# Patient Record
Sex: Female | Born: 1935 | Race: White | Hispanic: No | Smoking: Former smoker
Health system: Southern US, Community
[De-identification: ages and names within clinical notes are randomized; demographics above are authoritative.]

## PROBLEM LIST (undated history)

## (undated) DIAGNOSIS — R600 Localized edema: Secondary | ICD-10-CM

## (undated) DIAGNOSIS — G309 Alzheimer's disease, unspecified: Secondary | ICD-10-CM

## (undated) DIAGNOSIS — I447 Left bundle-branch block, unspecified: Secondary | ICD-10-CM

## (undated) DIAGNOSIS — F419 Anxiety disorder, unspecified: Secondary | ICD-10-CM

## (undated) DIAGNOSIS — F0280 Dementia in other diseases classified elsewhere without behavioral disturbance: Secondary | ICD-10-CM

## (undated) HISTORY — DX: Anxiety disorder, unspecified: F41.9

## (undated) HISTORY — PX: HEMORRHOID SURGERY: SHX153

## (undated) HISTORY — DX: Left bundle-branch block, unspecified: I44.7

## (undated) HISTORY — DX: Localized edema: R60.0

---

## 1958-09-09 HISTORY — PX: CHOLECYSTECTOMY: SHX55

## 1958-09-09 HISTORY — PX: OTHER SURGICAL HISTORY: SHX169

## 2004-08-01 ENCOUNTER — Ambulatory Visit: Payer: Self-pay | Admitting: Family Medicine

## 2005-01-08 LAB — HM MAMMOGRAPHY

## 2005-10-15 ENCOUNTER — Ambulatory Visit: Payer: Self-pay | Admitting: Gastroenterology

## 2005-12-14 ENCOUNTER — Ambulatory Visit: Payer: Self-pay | Admitting: *Deleted

## 2005-12-14 DIAGNOSIS — R609 Edema, unspecified: Secondary | ICD-10-CM | POA: Insufficient documentation

## 2005-12-17 ENCOUNTER — Encounter (INDEPENDENT_AMBULATORY_CARE_PROVIDER_SITE_OTHER): Payer: Self-pay | Admitting: *Deleted

## 2006-01-09 ENCOUNTER — Ambulatory Visit: Payer: Self-pay | Admitting: *Deleted

## 2006-02-06 ENCOUNTER — Ambulatory Visit: Payer: Self-pay | Admitting: *Deleted

## 2006-02-07 ENCOUNTER — Encounter: Payer: Self-pay | Admitting: Internal Medicine

## 2006-03-11 ENCOUNTER — Ambulatory Visit: Payer: Self-pay | Admitting: *Deleted

## 2006-03-22 ENCOUNTER — Ambulatory Visit: Payer: Self-pay | Admitting: *Deleted

## 2006-04-05 ENCOUNTER — Encounter (INDEPENDENT_AMBULATORY_CARE_PROVIDER_SITE_OTHER): Payer: Self-pay | Admitting: *Deleted

## 2006-07-19 ENCOUNTER — Ambulatory Visit: Payer: Self-pay | Admitting: *Deleted

## 2006-11-22 ENCOUNTER — Ambulatory Visit: Payer: Self-pay | Admitting: Internal Medicine

## 2006-11-22 DIAGNOSIS — R002 Palpitations: Secondary | ICD-10-CM | POA: Insufficient documentation

## 2006-11-25 LAB — CONVERTED CEMR LAB
BUN: 14 mg/dL (ref 6–23)
Bilirubin, Direct: 0.1 mg/dL (ref 0.0–0.3)
Calcium: 9.9 mg/dL (ref 8.4–10.5)
Chloride: 94 meq/L — ABNORMAL LOW (ref 96–112)
Eosinophils Absolute: 0.2 10*3/uL (ref 0.0–0.6)
Eosinophils Relative: 2.7 % (ref 0.0–5.0)
GFR calc Af Amer: 61 mL/min
GFR calc non Af Amer: 50 mL/min
HCT: 38.8 % (ref 36.0–46.0)
Lymphocytes Relative: 31.4 % (ref 12.0–46.0)
MCHC: 34.3 g/dL (ref 30.0–36.0)
MCV: 91.2 fL (ref 78.0–100.0)
Neutro Abs: 4.7 10*3/uL (ref 1.4–7.7)
Neutrophils Relative %: 58 % (ref 43.0–77.0)
Sodium: 134 meq/L — ABNORMAL LOW (ref 135–145)
TSH: 1.94 microintl units/mL (ref 0.35–5.50)
Total Protein: 7.2 g/dL (ref 6.0–8.3)
WBC: 8 10*3/uL (ref 4.5–10.5)

## 2006-12-03 ENCOUNTER — Ambulatory Visit: Payer: Self-pay | Admitting: Internal Medicine

## 2006-12-03 DIAGNOSIS — R7989 Other specified abnormal findings of blood chemistry: Secondary | ICD-10-CM | POA: Insufficient documentation

## 2006-12-09 LAB — CONVERTED CEMR LAB: Potassium: 3.1 meq/L — ABNORMAL LOW (ref 3.5–5.1)

## 2007-01-06 ENCOUNTER — Telehealth (INDEPENDENT_AMBULATORY_CARE_PROVIDER_SITE_OTHER): Payer: Self-pay | Admitting: *Deleted

## 2007-03-04 ENCOUNTER — Telehealth: Payer: Self-pay | Admitting: Internal Medicine

## 2007-03-06 ENCOUNTER — Ambulatory Visit: Payer: Self-pay | Admitting: Internal Medicine

## 2007-04-11 ENCOUNTER — Encounter (INDEPENDENT_AMBULATORY_CARE_PROVIDER_SITE_OTHER): Payer: Self-pay | Admitting: *Deleted

## 2007-04-11 ENCOUNTER — Ambulatory Visit: Payer: Self-pay | Admitting: Internal Medicine

## 2007-04-14 LAB — CONVERTED CEMR LAB
Albumin: 3.7 g/dL (ref 3.5–5.2)
Calcium: 9.8 mg/dL (ref 8.4–10.5)
GFR calc non Af Amer: 56 mL/min
Glucose, Bld: 90 mg/dL (ref 70–99)
Sodium: 139 meq/L (ref 135–145)

## 2007-05-26 ENCOUNTER — Ambulatory Visit: Payer: Self-pay | Admitting: Internal Medicine

## 2007-05-26 DIAGNOSIS — R498 Other voice and resonance disorders: Secondary | ICD-10-CM

## 2007-09-26 ENCOUNTER — Ambulatory Visit: Payer: Self-pay | Admitting: Internal Medicine

## 2007-09-26 DIAGNOSIS — K5909 Other constipation: Secondary | ICD-10-CM

## 2007-09-26 DIAGNOSIS — J309 Allergic rhinitis, unspecified: Secondary | ICD-10-CM | POA: Insufficient documentation

## 2008-03-25 ENCOUNTER — Ambulatory Visit: Payer: Self-pay | Admitting: Internal Medicine

## 2008-03-26 LAB — CONVERTED CEMR LAB
ALT: 16 units/L (ref 0–35)
AST: 21 units/L (ref 0–37)
Albumin: 4.1 g/dL (ref 3.5–5.2)
Alkaline Phosphatase: 72 units/L (ref 39–117)
Basophils Absolute: 0 10*3/uL (ref 0.0–0.1)
Creatinine, Ser: 1.1 mg/dL (ref 0.4–1.2)
HCT: 38.9 % (ref 36.0–46.0)
Lymphs Abs: 2.2 10*3/uL (ref 0.7–4.0)
Monocytes Absolute: 0.4 10*3/uL (ref 0.1–1.0)
Monocytes Relative: 6.4 % (ref 3.0–12.0)
Phosphorus: 3.4 mg/dL (ref 2.3–4.6)
Platelets: 450 10*3/uL — ABNORMAL HIGH (ref 150.0–400.0)
RDW: 12.2 % (ref 11.5–14.6)
Total Protein: 7 g/dL (ref 6.0–8.3)

## 2008-03-31 ENCOUNTER — Ambulatory Visit: Payer: Self-pay | Admitting: Internal Medicine

## 2008-03-31 DIAGNOSIS — R1314 Dysphagia, pharyngoesophageal phase: Secondary | ICD-10-CM

## 2008-04-28 ENCOUNTER — Encounter: Payer: Self-pay | Admitting: Internal Medicine

## 2008-09-21 ENCOUNTER — Ambulatory Visit: Payer: Self-pay | Admitting: Internal Medicine

## 2008-09-21 DIAGNOSIS — F411 Generalized anxiety disorder: Secondary | ICD-10-CM | POA: Insufficient documentation

## 2008-09-28 ENCOUNTER — Telehealth: Payer: Self-pay | Admitting: Internal Medicine

## 2008-10-04 ENCOUNTER — Telehealth: Payer: Self-pay | Admitting: Internal Medicine

## 2008-10-05 ENCOUNTER — Ambulatory Visit: Payer: Self-pay | Admitting: Internal Medicine

## 2008-10-06 ENCOUNTER — Telehealth: Payer: Self-pay | Admitting: Internal Medicine

## 2008-10-08 ENCOUNTER — Telehealth: Payer: Self-pay | Admitting: Internal Medicine

## 2008-10-12 ENCOUNTER — Telehealth: Payer: Self-pay | Admitting: Internal Medicine

## 2008-11-16 ENCOUNTER — Ambulatory Visit: Payer: Self-pay | Admitting: Internal Medicine

## 2009-02-18 ENCOUNTER — Ambulatory Visit: Payer: Self-pay | Admitting: Internal Medicine

## 2009-02-28 ENCOUNTER — Ambulatory Visit: Payer: Self-pay | Admitting: Internal Medicine

## 2009-02-28 DIAGNOSIS — R42 Dizziness and giddiness: Secondary | ICD-10-CM

## 2009-03-17 ENCOUNTER — Ambulatory Visit: Payer: Self-pay | Admitting: Family Medicine

## 2009-03-17 DIAGNOSIS — N390 Urinary tract infection, site not specified: Secondary | ICD-10-CM

## 2009-03-18 ENCOUNTER — Encounter: Payer: Self-pay | Admitting: Family Medicine

## 2009-03-21 ENCOUNTER — Telehealth: Payer: Self-pay | Admitting: Internal Medicine

## 2009-03-25 ENCOUNTER — Telehealth: Payer: Self-pay | Admitting: Internal Medicine

## 2009-08-22 ENCOUNTER — Ambulatory Visit: Payer: Self-pay | Admitting: Internal Medicine

## 2009-08-23 ENCOUNTER — Telehealth: Payer: Self-pay | Admitting: Internal Medicine

## 2009-08-29 ENCOUNTER — Telehealth: Payer: Self-pay | Admitting: Internal Medicine

## 2009-08-30 ENCOUNTER — Ambulatory Visit: Payer: Self-pay | Admitting: Family Medicine

## 2009-08-30 DIAGNOSIS — Z9189 Other specified personal risk factors, not elsewhere classified: Secondary | ICD-10-CM | POA: Insufficient documentation

## 2009-08-30 LAB — CONVERTED CEMR LAB
Nitrite: NEGATIVE
Urine crystals, microscopic: 0 /hpf
Urobilinogen, UA: 0.2
WBC Urine, dipstick: NEGATIVE
Yeast, UA: 0

## 2009-09-01 LAB — CONVERTED CEMR LAB
Basophils Absolute: 0.1 10*3/uL (ref 0.0–0.1)
Eosinophils Relative: 4.6 % (ref 0.0–5.0)
MCV: 93 fL (ref 78.0–100.0)
Monocytes Absolute: 0.6 10*3/uL (ref 0.1–1.0)
Monocytes Relative: 7.8 % (ref 3.0–12.0)
Neutrophils Relative %: 54.8 % (ref 43.0–77.0)
Platelets: 542 10*3/uL — ABNORMAL HIGH (ref 150.0–400.0)
RDW: 13.1 % (ref 11.5–14.6)
WBC: 7.3 10*3/uL (ref 4.5–10.5)

## 2009-10-12 ENCOUNTER — Ambulatory Visit: Payer: Self-pay | Admitting: Family Medicine

## 2009-10-12 DIAGNOSIS — J157 Pneumonia due to Mycoplasma pneumoniae: Secondary | ICD-10-CM

## 2009-10-31 ENCOUNTER — Ambulatory Visit: Payer: Self-pay | Admitting: Internal Medicine

## 2009-11-01 ENCOUNTER — Telehealth: Payer: Self-pay | Admitting: Internal Medicine

## 2010-01-12 ENCOUNTER — Ambulatory Visit
Admission: RE | Admit: 2010-01-12 | Discharge: 2010-01-12 | Payer: Self-pay | Source: Home / Self Care | Attending: Family Medicine | Admitting: Family Medicine

## 2010-02-07 ENCOUNTER — Emergency Department: Payer: Self-pay | Admitting: Emergency Medicine

## 2010-02-07 NOTE — Progress Notes (Signed)
Summary: still constipated  Phone Note Call from Patient   Caller: Patient Summary of Call: Pt called, still having problem with constipation.  She has not tried a suppository, I advised her to try that and call back if not better.  She is also going to call the nurse at her facility and see if she will help her with an enema. Initial call taken by: Lowella Petties CMA,  August 23, 2009 10:45 AM  Follow-up for Phone Call        PLease let her know that it will take a while for the miralax to loosen things up She may be better off waiting at least 3-4 days after starting the miralax before she uses the enema or suppository Follow-up by: Cindee Salt MD,  August 23, 2009 11:01 AM  Additional Follow-up for Phone Call Additional follow up Details #1::        Advised pt, she says miralax has never worked for her, I advised her to continue for a few days. Additional Follow-up by: Lowella Petties CMA,  August 23, 2009 11:07 AM

## 2010-02-07 NOTE — Assessment & Plan Note (Signed)
Summary: ? UTI   Vital Signs:  Patient profile:   77 year old female Height:      60.5 inches Weight:      116.8 pounds Temp:     98.1 degrees F oral Pulse rate:   76 / minute Pulse rhythm:   regular BP sitting:   130 / 80  (left arm) Cuff size:   regular  Vitals Entered By: Benny Lennert CMA Duncan Dull) (March 17, 2009 9:53 AM)  History of Present Illness: Chief complaint ? uti  Pushing. No pain  some urgency, no abd pain, no flank pain, no fever no dysuria  ros above  GEN: WDWN, A&Ox4,NAD. Non-toxic HEENT: Atraumatic, normocephalic. CV: RRR, No M/G/R PULM: CTA B, No wheezes, crackles, or rhonchi ABD: S, NT, ND, +BS, no rebound. No CVAT. No suprapubic tenderness. EXT: No c/c/e   Allergies: 1)  ! * Ivp Dye 2)  ! Penicillin V Potassium (Penicillin V Potassium) 3)  ! Prednisone (Prednisone) 4)  Codeine Sulfate (Codeine Sulfate) 5)  Aspirin (Aspirin) 6)  Prevacid (Lansoprazole) 7)  Sulfa 8)  Hyzaar (Losartan Potassium-Hctz)  Past History:  Past medical, surgical, family and social histories (including risk factors) reviewed, and no changes noted (except as noted below).  Past Medical History: Reviewed history from 09/21/2008 and no changes required. Leg edema Constipation LBBB Allergic rhinitis Anxiety  Past Surgical History: Reviewed history from 04/05/2006 and no changes required. Cholecystectomy-1960's Hemorrhoidectomy- late 1950's Hysterectomy- 1960's  Family History: Reviewed history from 11/22/2006 and no changes required. Mom died of colon cancer @76  Dad died of MI in 22's 2 - 1/2 brothers and 1- 1/2 sister No other CAD No HTN or DM Mat aunt had lung cancer No breast cancer  Social History: Reviewed history from 11/16/2008 and no changes required. Widowed--2 children--1 son, 1 daughter Independent living at [facility] Retired--hosiery worker Former Smoker--very brief Alcohol use-no  Has living will. Sherry Fini-ex-DIL to make decisions  for her. Requests DNR after discussion. No feeding tube   Impression & Recommendations:  Problem # 1:  UTI (ICD-599.0) Assessment New  Her updated medication list for this problem includes:    Ciprofloxacin Hcl 250 Mg Tabs (Ciprofloxacin hcl) .Marland Kitchen... 1 by mouth two times a day  Encouraged to push clear liquids, get enough rest, and take acetaminophen as needed. To be seen in 10 days if no improvement, sooner if worse.  Orders: T-Culture, Urine ([id number])  Complete Medication List: 1)  Adult Aspirin Low Strength 81 Mg Tbdp (Aspirin) .... Take 1 tablet by mouth once a day 2)  Ex-lax 15 Mg Tabs (Sennosides) .... As needed 3)  Meclizine Hcl 12.5 Mg Tabs (Meclizine hcl) .Marland Kitchen.. 1 tab by mouth three times a day as needed for dizziness 4)  Ciprofloxacin Hcl 250 Mg Tabs (Ciprofloxacin hcl) .Marland Kitchen.. 1 by mouth two times a day Prescriptions: CIPROFLOXACIN HCL 250 MG TABS (CIPROFLOXACIN HCL) 1 by mouth two times a day  #14 x 0   Entered and Authorized by:   Hannah Beat MD   Signed by:   Hannah Beat MD on 03/17/2009   Method used:   [facility]        [id number]       [address]       [place], [place]  [address]       Ph: [phone]       Fax: [phone]   RxID:   [id number]   Current Allergies (reviewed today): ! * IVP DYE !  PENICILLIN V POTASSIUM (PENICILLIN V POTASSIUM) ! PREDNISONE (PREDNISONE) CODEINE SULFATE (CODEINE SULFATE) ASPIRIN (ASPIRIN) PREVACID (LANSOPRAZOLE) SULFA HYZAAR (LOSARTAN POTASSIUM-HCTZ)  Appended Document: Orders Update    Clinical Lists Changes  Orders: Added new Service order of Specimen Handling ([id number]) - Signed

## 2010-02-07 NOTE — Assessment & Plan Note (Signed)
Summary: DIZZY/CLE   Vital Signs:  Patient profile:   77 year old female Weight:      116 pounds Temp:     98.3 degrees F oral Pulse rate:   76 / minute Pulse rhythm:   regular BP sitting:   152 / 80  (left arm) Cuff size:   regular  Vitals Entered By: Mervin Hack CMA Duncan Dull) (February 28, 2009 4:47 PM) CC: dizzy   History of Present Illness: Was very lightheaded all last week Close to falling a couple of times Got a cane at [facility] to balance herself  Now today, she feels better  didn't feel close to syncope Used OTC cerumen removal drops--thinks that may have helped  Did have some rotatory spinning last week no nausea  No weakness no trouble with speech or swallowing  Allergies: 1)  ! * Ivp Dye 2)  ! Penicillin V Potassium (Penicillin V Potassium) 3)  ! Prednisone (Prednisone) 4)  Codeine Sulfate (Codeine Sulfate) 5)  Aspirin (Aspirin) 6)  Prevacid (Lansoprazole) 7)  Sulfa 8)  Hyzaar (Losartan Potassium-Hctz)  Past History:  Past medical, surgical, family and social histories (including risk factors) reviewed for relevance to current acute and chronic problems.  Past Medical History: Reviewed history from 09/21/2008 and no changes required. Leg edema Constipation LBBB Allergic rhinitis Anxiety  Past Surgical History: Reviewed history from 04/05/2006 and no changes required. Cholecystectomy-1960's Hemorrhoidectomy- late 1950's Hysterectomy- 1960's  Family History: Reviewed history from 11/22/2006 and no changes required. Mom died of colon cancer @76  Dad died of MI in 35's 2 - 1/2 brothers and 1- 1/2 sister No other CAD No HTN or DM Mat aunt had lung cancer No breast cancer  Social History: Reviewed history from 11/16/2008 and no changes required. Widowed--2 children--1 son, 1 daughter Independent living at [facility] Retired--hosiery worker Former Smoker--very brief Alcohol use-no  Has living will. Sherry Santilli-ex-DIL to make  decisions for her. Requests DNR after discussion. No feeding tube  Review of Systems       appetite okay sleep is still not great----tylenol or advil does help Bowels okay with senekot and stool softener  Physical Exam  General:  alert and normal appearance.   Eyes:  pupils equal, pupils round, and pupils reactive to light.  EOM full NO nystagmus Ears:  R ear normal and L ear normal.   No cerumen Mouth:  no erythema, no exudates, and no lesions.   Neck:  supple, no masses, and no cervical lymphadenopathy.   Neurologic:  alert & oriented X3, cranial nerves II-XII intact, strength normal in all extremities, gait normal, finger-to-nose normal, and Romberg negative.   Psych:  normally interactive, good eye contact, not anxious appearing, and not depressed appearing.     Complete Medication List: 1)  Adult Aspirin Low Strength 81 Mg Tbdp (Aspirin) .... Take 1 tablet by mouth once a day 2)  Ex-lax 15 Mg Tabs (Sennosides) .... As needed 3)  Meclizine Hcl 12.5 Mg Tabs (Meclizine hcl) .Marland Kitchen.. 1 tab by mouth three times a day as needed for dizziness  Patient Instructions: 1)  Please keep your regular follow up 2)  Take the meclizine 12.5 mg three times a day if the dizziness and spinning sensation comes back  Current Allergies (reviewed today): ! * IVP DYE ! PENICILLIN V POTASSIUM (PENICILLIN V POTASSIUM) ! PREDNISONE (PREDNISONE) CODEINE SULFATE (CODEINE SULFATE) ASPIRIN (ASPIRIN) PREVACID (LANSOPRAZOLE) SULFA HYZAAR (LOSARTAN POTASSIUM-HCTZ)

## 2010-02-07 NOTE — Progress Notes (Signed)
Summary: miralax upsetting stomach/alc   Phone Note Call from Patient Call back at Home Phone [phone]   Caller: Patient Call For: Cindee Salt MD Summary of Call: Patient says that her stomach has been hurting and rumbling since saturday night. She says that it started after starting the miralax. She wants to know if she needs to be seen of if she should try something else. [facility] [address]  Initial call taken by: Melody Comas,  August 29, 2009 4:12 PM  Follow-up for Phone Call        have her try just half a capful instead of a full one Make sure she is mixing it with a lot of water Cindee Salt MD  August 29, 2009 4:44 PM   spoke with patient and she will try 1/2 and drink plenty of water Follow-up by: Mervin Hack CMA Duncan Dull),  August 29, 2009 5:20 PM

## 2010-02-07 NOTE — Progress Notes (Signed)
Summary: needs rx for sorbitol   Phone Note Call from Patient Call back at Home Phone [phone]   Caller: Daughter Call For: Cindee Salt MD Summary of Call: Daughter took patient to pharmacy yesterday to get her medications, they were told that she would need a rx for the sorbitol because it is on longer over the counter. [facility] [address]  Initial call taken by: Melody Comas,  November 01, 2009 9:56 AM  Follow-up for Phone Call        okay to send Rx for 1 month supply with 3 refills  Let her know that if she can tolerate it three times a day, it would be okay to start with this much, but I don't think she will need it that much after a while Follow-up by: Cindee Salt MD,  November 01, 2009 10:32 AM  Additional Follow-up for Phone Call Additional follow up Details #1::        rx sent to pharmacy, spoke with patient and let her know. Additional Follow-up by: Mervin Hack CMA Duncan Dull),  November 01, 2009 4:35 PM    Prescriptions: SORBITOL 70 % SOLN (SORBITOL) 2  ounces up to three times  daily to prevent constipatioin  #54mth x 3   Entered by:   Mervin Hack CMA (AAMA)   Authorized by:   Cindee Salt MD   Signed by:   Mervin Hack CMA (AAMA) on 11/01/2009   Method used:   Electronically to        [id number]       [address]       [place], [place]  [address]       Ph: [phone]       Fax: [phone]   RxID:   [id number]

## 2010-02-07 NOTE — Assessment & Plan Note (Signed)
Summary: BODY IS TREMBLING & SHE IS COLD / LFW   Vital Signs:  Patient profile:   77 year old female Height:      60.5 inches Weight:      113.75 pounds BMI:     21.93 Temp:     99 degrees F oral Pulse rate:   88 / minute Pulse rhythm:   regular BP sitting:   158 / 84  (left arm) Cuff size:   regular  Vitals Entered By: Lewanda Rife LPN (August 30, 2009 2:54 PM) CC: Body trembling and pt feels cold   History of Present Illness: started feeling shaky and chilled -- this am  here- she has a fever   no BM for quite a few days  stomach is rumbling a lot - could not go to church  no urinary symptoms   is drinking lots of water   no sore throat or cold symtoms or cough   stomach not hurting - just bloated and tight  stays constipated  takes med -- stool softener - this usually helps    last week poor appetite and a little nausea- thought she had a virus thinks her friend gave it to her and her appetite is still poor  no tick bites or rash anywhere    Allergies: 1)  ! * Ivp Dye 2)  ! Penicillin V Potassium (Penicillin V Potassium) 3)  ! Prednisone (Prednisone) 4)  Codeine Sulfate (Codeine Sulfate) 5)  Aspirin (Aspirin) 6)  Prevacid (Lansoprazole) 7)  Sulfa 8)  Hyzaar (Losartan Potassium-Hctz)  Past History:  Past Medical History: Last updated: 09/21/2008 Leg edema Constipation LBBB Allergic rhinitis Anxiety  Past Surgical History: Last updated: 04/05/2006 Cholecystectomy-1960's Hemorrhoidectomy- late 1950's Hysterectomy- 1960's  Family History: Last updated: Dec 10, 2006 Mom died of colon cancer @76  Dad died of MI in 1's 2 - 1/2 brothers and 1- 1/2 sister No other CAD No HTN or DM Mat aunt had lung cancer No breast cancer  Social History: Last updated: 11/16/2008 Widowed--2 children--1 son, 1 daughter Independent living at [facility] Retired--hosiery worker Former Smoker--very brief Alcohol use-no  Has living will. Sherry Mccannon-ex-DIL to make  decisions for her. Requests DNR after discussion. No feeding tube  Risk Factors: Smoking Status: quit (Dec 10, 2006)  Review of Systems General:  Complains of chills, fatigue, fever, and loss of appetite. Eyes:  Denies blurring, discharge, and eye irritation. ENT:  Complains of postnasal drainage. CV:  Denies chest pain or discomfort and palpitations. Resp:  Denies cough, shortness of breath, and wheezing. GI:  Complains of indigestion, loss of appetite, and vomiting; denies abdominal pain and change in bowel habits. Derm:  Denies itching, lesion(s), poor wound healing, and rash. Neuro:  Denies headaches, numbness, and tingling. Endo:  Denies excessive thirst and excessive urination. Heme:  Denies abnormal bruising, bleeding, enlarge lymph nodes, and skin discoloration.  Physical Exam  General:  Well-developed,well-nourished,in no acute distress; alert,appropriate and cooperative throughout examination Head:  normocephalic, atraumatic, and no abnormalities observed.  no sinus tenderness  Eyes:  vision grossly intact, pupils equal, pupils round, pupils reactive to light, and no injection.   Ears:  R ear normal and L ear normal.   Nose:  no nasal discharge.   Mouth:  pharynx pink and moist.   Neck:  supple, no masses, no thyromegaly, no carotid bruits, and no cervical lymphadenopathy.   Chest Wall:  No deformities, masses, or tenderness noted. Lungs:  Normal respiratory effort, chest expands symmetrically. Lungs are clear to auscultation, no  crackles or wheezes. Heart:  normal rate, regular rhythm, no murmur, and no gallop.   Abdomen:  Bowel sounds positive,abdomen soft and non-tender without masses, organomegaly or hernias noted. bs are slt hyperactive - not high pitched or tinkling  Msk:  No deformity or scoliosis noted of thoracic or lumbar spine.  no acute joint changes  Extremities:  No clubbing, cyanosis, edema, or deformity noted with normal full range of motion of all joints.     Neurologic:  gait normal and DTRs symmetrical and normal.   Skin:  Intact without suspicious lesions or rashes Cervical Nodes:  No lymphadenopathy noted Inguinal Nodes:  No significant adenopathy Psych:  normal affect, talkative and pleasant    Impression & Recommendations:  Problem # 1:  FEVER, HX OF (ICD-V15.9) Assessment New with exposure to a mild GI virus- some loss of appetite and mild nausea ua neg  check cbc today disc good fluid intake / rest /tylenol as needed  will update if worse or other symptoms  Orders: Venipuncture (29562) TLB-CBC Platelet - w/Differential ([id number]) UA Dipstick W/ Micro (manual) (13086)  Complete Medication List: 1)  Adult Aspirin Low Strength 81 Mg Tbdp (Aspirin) .... Take 1 tablet by mouth once a day 2)  Meclizine Hcl 12.5 Mg Tabs (Meclizine hcl) .Marland Kitchen.. 1 tab by mouth three times a day as needed for dizziness 3)  Fleet Enema 7-19 Gm/155ml Enem (Sodium phosphates) .... Use as needed if no stool in 3days 4)  Stool Softener Laxative 8.6-50 Mg Tabs (Sennosides-docusate sodium) .... As needed 5)  Polyethylene Glycol 3350 Powd (Polyethylene glycol 3350) .Marland Kitchen.. 1 capful mixed with water once a day to keep stool soft 6)  Ranitidine Hcl 150 Mg Tabs (Ranitidine hcl) .Marland Kitchen.. 1 tab at bedtime to reduce acid and help swallowing  Patient Instructions: 1)  keep drinking lots of fluids  2)  keep track of your fever -- update me if it goes over 101.5  3)  you can take tylenol as directed for fever 4)  urinalysis was normal today 5)  will do blood work now and eBay you 6)  this is likely a virus 7)  get lots of rest   Current Allergies (reviewed today): ! * IVP DYE ! PENICILLIN V POTASSIUM (PENICILLIN V POTASSIUM) ! PREDNISONE (PREDNISONE) CODEINE SULFATE (CODEINE SULFATE) ASPIRIN (ASPIRIN) PREVACID (LANSOPRAZOLE) SULFA HYZAAR (LOSARTAN POTASSIUM-HCTZ)  Laboratory Results   Urine Tests  Date/Time Received: August 30, 2009 3:42 PM  Date/Time  Reported: August 30, 2009 3:42 PM   Routine Urinalysis   Color: yellow Appearance: Clear Glucose: negative   (Normal Range: Negative) Bilirubin: negative   (Normal Range: Negative) Ketone: negative   (Normal Range: Negative) Spec. Gravity: 1.010   (Normal Range: 1.003-1.035) Blood: negative   (Normal Range: Negative) pH: 6.0   (Normal Range: 5.0-8.0) Protein: negative   (Normal Range: Negative) Urobilinogen: 0.2   (Normal Range: 0-1) Nitrite: negative   (Normal Range: Negative) Leukocyte Esterace: negative   (Normal Range: Negative)  Urine Microscopic WBC/HPF: 0 RBC/HPF: 0 Bacteria/HPF: none Mucous/HPF: few  Epithelial/HPF: 1-2 Crystals/HPF: 0 Casts/LPF: 0 Yeast/HPF: 0 Other: 0        Laboratory Results   Urine Tests    Routine Urinalysis   Color: yellow Appearance: Clear Glucose: negative   (Normal Range: Negative) Bilirubin: negative   (Normal Range: Negative) Ketone: negative   (Normal Range: Negative) Spec. Gravity: 1.010   (Normal Range: 1.003-1.035) Blood: negative   (Normal Range: Negative) pH: 6.0   (Normal Range: 5.0-8.0) Protein:  negative   (Normal Range: Negative) Urobilinogen: 0.2   (Normal Range: 0-1) Nitrite: negative   (Normal Range: Negative) Leukocyte Esterace: negative   (Normal Range: Negative)  Urine Microscopic WBC/hpf: 0 RBC/hpf: 0 Bacteria: none Mucous: few  Epithelial: 1-2 Crystals/LPF: 0 Casts/LPF: 0 Yeast/HPF: 0 Other: 0

## 2010-02-07 NOTE — Assessment & Plan Note (Signed)
Summary: 3 month follow up/rbh   Vital Signs:  Patient profile:   77 year old female Weight:      117 pounds Temp:     97.9 degrees F oral Pulse rate:   68 / minute Pulse rhythm:   regular BP sitting:   140 / 70  (left arm) Cuff size:   regular  Vitals Entered By: Mervin Hack CMA Duncan Dull) (February 18, 2009 12:16 PM) CC: 3 month follow-up   History of Present Illness: Doing okay Had to stop the miralax Caused swelling in ankles and hands Also had abdominal bloating Just stopped this recently  Takes 4 senekot and 2 stool softeners ----this did work actually was too much MOM not effective  Fiber has not been tolerated  Allergies: 1)  ! * Ivp Dye 2)  ! Penicillin V Potassium (Penicillin V Potassium) 3)  ! Prednisone (Prednisone) 4)  Codeine Sulfate (Codeine Sulfate) 5)  Aspirin (Aspirin) 6)  Prevacid (Lansoprazole) 7)  Sulfa 8)  Hyzaar (Losartan Potassium-Hctz)  Past History:  Past medical, surgical, family and social histories (including risk factors) reviewed for relevance to current acute and chronic problems.  Past Medical History: Reviewed history from 09/21/2008 and no changes required. Leg edema Constipation LBBB Allergic rhinitis Anxiety  Past Surgical History: Reviewed history from 04/05/2006 and no changes required. Cholecystectomy-1960's Hemorrhoidectomy- late 1950's Hysterectomy- 1960's  Family History: Reviewed history from 11/22/2006 and no changes required. Mom died of colon cancer @76  Dad died of MI in 11's 2 - 1/2 brothers and 1- 1/2 sister No other CAD No HTN or DM Mat aunt had lung cancer No breast cancer  Social History: Reviewed history from 11/16/2008 and no changes required. Widowed--2 children--1 son, 1 daughter Independent living at [facility] Retired--hosiery worker Former Smoker--very brief Alcohol use-no  Has living will. Sherry Valone-ex-DIL to make decisions for her. Requests DNR after discussion. No feeding  tube  Review of Systems       appetite is fine weight up 4# No SOB  Physical Exam  General:  alert and normal appearance.   Neck:  supple, no masses, no thyromegaly, no carotid bruits, and no cervical lymphadenopathy.   Lungs:  normal respiratory effort and normal breath sounds.   Heart:  normal rate, regular rhythm, no murmur, and no gallop.   Abdomen:  soft and non-tender.   Extremities:  slight edema in right ankle only Psych:  normally interactive, good eye contact, not anxious appearing, and not depressed appearing.     Impression & Recommendations:  Problem # 1:  CONSTIPATION, CHRONIC (ICD-564.09) Assessment Comment Only unable to tolerate miralax will advise on anoother regimen  Complete Medication List: 1)  Adult Aspirin Low Strength 81 Mg Tbdp (Aspirin) .... Take 1 tablet by mouth once a day 2)  Ex-lax 15 Mg Tabs (Sennosides) .... As needed  Patient Instructions: 1)  Please schedule a follow-up appointment in 6 months .  2)  Please take 2 senekot daily and 2 stool softeners daily. If too many stools, cut back on the stool softener 3)  Okay to take ex-lax occasionally--like if you haven't moved bowels in 3-4 days  Current Allergies (reviewed today): ! * IVP DYE ! PENICILLIN V POTASSIUM (PENICILLIN V POTASSIUM) ! PREDNISONE (PREDNISONE) CODEINE SULFATE (CODEINE SULFATE) ASPIRIN (ASPIRIN) PREVACID (LANSOPRAZOLE) SULFA HYZAAR (LOSARTAN POTASSIUM-HCTZ)

## 2010-02-07 NOTE — Progress Notes (Signed)
Summary: Constipation issue  Phone Note From Other Clinic Call back at cell [phone]   Caller: [place]andy Call For: Dr. Alphonsus Sias Summary of Call: [name] calls stating that [name] is complaining again of being constipated.  Says she is in pain and feels pressure in her rectum.  She says she has not had a bowel movement in three days.  Takes Sennokt, a stool sofner, and miralax as needed.  Patient gave herself an enema about 30 minutes ago and still no relief.  Mandy calls back before I could get this note finished to say that patient called her and said that she just had a bowel movement and is feeling much better.  [name] wants to know if you want to change any of Christine's medications or are there any new standing orders you would like to put in place regarding her bouts with constipation.  Please advise. Initial call taken by: Linde Gillis CMA Duncan Dull),  March 21, 2009 2:37 PM  Follow-up for Phone Call        no change needed I think she should use an enema if she has no stool for 3 days---it seemed to work this time and occ people just need a little help Follow-up by: Cindee Salt MD,  March 21, 2009 3:50 PM  Additional Follow-up for Phone Call Additional follow up Details #1::        [name] would like a standing order to use the enema, just for legal purposes, fax to [phone], can I send this phone note? please advise. Additional Follow-up by: Mervin Hack CMA Duncan Dull),  March 21, 2009 4:01 PM    Additional Follow-up for Phone Call Additional follow up Details #2::    I will write order add to med list Cindee Salt MD  March 21, 2009 4:07 PM   order faxed to [facility] at [phone] Follow-up by: Mervin Hack CMA Duncan Dull),  March 21, 2009 4:45 PM  New/Updated Medications: FLEET ENEMA 7-19 GM/118ML ENEM (SODIUM PHOSPHATES) use as needed if no stool in 3days

## 2010-02-07 NOTE — Assessment & Plan Note (Signed)
Summary: ROA FOR F/U/JRR   Vital Signs:  Patient profile:   77 year old female Weight:      112 pounds BMI:     21.59 Temp:     98.3 degrees F oral Pulse rate:   76 / minute Pulse rhythm:   regular BP sitting:   142 / 70  (left arm) Cuff size:   regular  Vitals Entered By: Mervin Hack CMA Duncan Dull) (August 22, 2009 12:22 PM) CC: constipation   History of Present Illness: DOing okay in general  still with chronic constipation Using senna and ex-lax as needed  Found the miralax didn't help needs something "to push it out"  Having some throat symptoms No heartburn Occ dysphagia--gets hung in throat at times not sick--no fever  Occ dizziness if she jumps up quick No fainting no chest pain  No SOB  Allergies: 1)  ! * Ivp Dye 2)  ! Penicillin V Potassium (Penicillin V Potassium) 3)  ! Prednisone (Prednisone) 4)  Codeine Sulfate (Codeine Sulfate) 5)  Aspirin (Aspirin) 6)  Prevacid (Lansoprazole) 7)  Sulfa 8)  Hyzaar (Losartan Potassium-Hctz)  Past History:  Past medical, surgical, family and social histories (including risk factors) reviewed for relevance to current acute and chronic problems.  Past Medical History: Reviewed history from 09/21/2008 and no changes required. Leg edema Constipation LBBB Allergic rhinitis Anxiety  Past Surgical History: Reviewed history from 04/05/2006 and no changes required. Cholecystectomy-1960's Hemorrhoidectomy- late 1950's Hysterectomy- 1960's  Family History: Reviewed history from 11/22/2006 and no changes required. Mom died of colon cancer @76  Dad died of MI in 76's 2 - 1/2 brothers and 1- 1/2 sister No other CAD No HTN or DM Mat aunt had lung cancer No breast cancer  Social History: Reviewed history from 11/16/2008 and no changes required. Widowed--2 children--1 son, 1 daughter Independent living at [facility] Retired--hosiery worker Former Smoker--very brief Alcohol use-no  Has living will. Sherry  Trefz-ex-DIL to make decisions for her. Requests DNR after discussion. No feeding tube  Review of Systems       weight is down a few pounds not much appetite sleep isn't great--occ naps  Physical Exam  General:  alert.  NAD Mouth:  no erythema, no exudates, and no lesions.   Neck:  supple, no masses, no thyromegaly, no carotid bruits, and no cervical lymphadenopathy.   Lungs:  normal respiratory effort, no intercostal retractions, no accessory muscle use, and normal breath sounds.   Heart:  normal rate, regular rhythm, no murmur, and no gallop.   Abdomen:  soft and non-tender.   Extremities:  no edema Psych:  normally interactive, good eye contact, not depressed appearing, and slightly anxious.     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 As needed Her updated medication list for this problem includes:    Fleet Enema 7-19 Gm/19ml Enem (Sodium phosphates) ..... Use as needed if no stool in 3days    Stool Softener Laxative 8.6-50 Mg Tabs (Sennosides-docusate sodium) .Marland Kitchen... As needed    Polyethylene Glycol 3350 Powd (Polyethylene glycol 3350) .Marland Kitchen... 1 capful mixed with water once a day to  keep stool soft  Complete Medication List: 1)  Adult Aspirin Low Strength 81 Mg Tbdp (Aspirin) .... Take 1 tablet by mouth once a day 2)  Meclizine Hcl 12.5 Mg Tabs (Meclizine hcl) .Marland Kitchen.. 1 tab by mouth three times a day as needed for dizziness 3)  Fleet Enema 7-19 Gm/124ml Enem (Sodium phosphates) .... Use as needed if no stool in 3days 4)  Stool Softener  Laxative 8.6-50 Mg Tabs (Sennosides-docusate sodium) .... As needed 5)  Polyethylene Glycol 3350 Powd (Polyethylene glycol 3350) .Marland Kitchen.. 1 capful mixed with water once a day to keep stool soft 6)  Ranitidine Hcl 150 Mg Tabs (Ranitidine hcl) .Marland Kitchen.. 1 tab at bedtime to reduce acid and help swallowing  Patient Instructions: 1)  Please retry the miralax (polyethylene glycol) daily. Then use a dulcolax suppository n(or tab if that works) or enema twice a week to get things moving 2)  Please take ranitidine 150 mg at bedtime to help your swallowing 3)  Please schedule a follow-up appointment in 6 months .   Current Allergies (reviewed today): ! * IVP DYE ! PENICILLIN V POTASSIUM (PENICILLIN V POTASSIUM) ! PREDNISONE (PREDNISONE) CODEINE SULFATE (CODEINE SULFATE) ASPIRIN (ASPIRIN) PREVACID (LANSOPRAZOLE) SULFA HYZAAR (LOSARTAN POTASSIUM-HCTZ)

## 2010-02-07 NOTE — Progress Notes (Signed)
Summary: pt is constipated  Phone Note From Other Clinic   Caller: Mandy at [facility]  [phone] Summary of Call: Pt has long hx of constipation, as you are aware of.  Her last BM was 3/14.  She is taking senekot, stool softeners and x-lax daily.  She has had 2 enemas since yesterday.  No significant results.  Cant take miralax, burns her.  Cant have prunes or prune juice because of the sugar.  Mandy checked for impaction, didnt feel any stool.  Does have some abd distention, is passing gas.  Please advise on what pt can do to get relief today, pt is very anxious about this. Initial call taken by: Lowella Petties CMA,  March 25, 2009 1:18 PM  Follow-up for Phone Call        have her try 4 ounces of magnesium citrate and repeat it 2 hours later if no response  May want to take senna-s 2 two times a day if not already taking that many (on a daily basis) Follow-up by: Cindee Salt MD,  March 25, 2009 1:45 PM  Additional Follow-up for Phone Call Additional follow up Details #1::        Advised Mandy.  She said if the pt doesnt want to try this they may take her to ER.  That is what the pt wants to do. Additional Follow-up by: Lowella Petties CMA,  March 25, 2009 2:26 PM    Additional Follow-up for Phone Call Additional follow up Details #2::    noted Follow-up by: Cindee Salt MD,  March 25, 2009 2:34 PM

## 2010-02-07 NOTE — Assessment & Plan Note (Signed)
Summary: BAD COUGH GETTING WORSE / LFW   Vital Signs:  Patient profile:   77 year old female Height:      60.5 inches Weight:      111.0 pounds BMI:     21.40 O2 Sat:      98 % on Room air Temp:     99.1 degrees F oral Pulse rate:   91 / minute Pulse rhythm:   regular Resp:     16 per minute BP sitting:   120 / 70  (left arm) Cuff size:   regular  Vitals Entered By: Benny Lennert CMA (AAMA) (October 12, 2009 11:38 AM)  O2 Flow:  Room air  History of Present Illness: Chief complaint Bad cough is getting worse  77 year old:    Acute Visit History:      The patient complains of cough, fever, and sore throat.  These symptoms began 1 month ago.  She denies earache, genitourinary symptoms, headache, and nausea.        Her highest temperature has been 99's.        The cough interferes with her sleep.  The character of the cough is described as productive.  There is no history of wheezing, shortness of breath, respiratory retractions, tachypnea, cyanosis, or interference with oral intake associated with her cough.        Allergies: 1)  ! * Ivp Dye 2)  ! Penicillin V Potassium (Penicillin V Potassium) 3)  ! Prednisone (Prednisone) 4)  Codeine Sulfate (Codeine Sulfate) 5)  Aspirin (Aspirin) 6)  Prevacid (Lansoprazole) 7)  Sulfa 8)  Hyzaar (Losartan Potassium-Hctz)  Past History:  Past medical, surgical, family and social histories (including risk factors) reviewed, and no changes noted (except as noted below).  Past Medical History: Reviewed history from 09/21/2008 and no changes required. Leg edema Constipation LBBB Allergic rhinitis Anxiety  Past Surgical History: Reviewed history from 04/05/2006 and no changes required. Cholecystectomy-1960's Hemorrhoidectomy- late 1950's Hysterectomy- 1960's  Family History: Reviewed history from 11/22/2006 and no changes required. Mom died of colon cancer @76  Dad died of MI in 60's 2 - 1/2 brothers and 1- 1/2 sister No  other CAD No HTN or DM Mat aunt had lung cancer No breast cancer  Social History: Reviewed history from 11/16/2008 and no changes required. Widowed--2 children--1 son, 1 daughter Independent living at [facility] Retired--hosiery worker Former Smoker--very brief Alcohol use-no  Has living will. Sherry Sinyard-ex-DIL to make decisions for her. Requests DNR after discussion. No feeding tube  Review of Systems       REVIEW OF SYSTEMS GEN: Acute illness details above. CV: No chest pain or SOB GI: No noted N or V Otherwise, pertinent positives and negatives are noted in the HPI.   Physical Exam  Additional Exam:  GEN: A and O x 3. WDWN. NAD.    ENT: Nose clear, ext NML.  No LAD.  No JVD.  TM's clear. Oropharynx clear.  PULM: Normal WOB, no distress. No crackles, wheezes, rhonchi. CV: RRR, no M/G/R, No rubs, No JVD.   ABD: S, NT, ND, + BS. No rebound. No guarding. No HSM.   EXT: warm and well-perfused, No c/c/e. PSYCH: Pleasant and conversant.    Impression & Recommendations:  Problem # 1:  WALKING PNEUMONIA (ICD-483.0) Assessment New 2 weeks worsening intermittent 6 weeks of symptoms  cover atypicals and pertussis robitussin dm  Her updated medication list for this problem includes:    Azithromycin 250 Mg Tabs (Azithromycin) .Marland KitchenMarland KitchenMarland KitchenMarland Kitchen  2 by  mouth today and then 1 daily for 4 days  Complete Medication List: 1)  Adult Aspirin Low Strength 81 Mg Tbdp (Aspirin) .... Take 1 tablet by mouth once a day 2)  Meclizine Hcl 12.5 Mg Tabs (Meclizine hcl) .Marland Kitchen.. 1 tab by mouth three times a day as needed for dizziness 3)  Fleet Enema 7-19 Gm/136ml Enem (Sodium phosphates) .... Use as needed if no stool in 3days 4)  Stool Softener Laxative 8.6-50 Mg Tabs (Sennosides-docusate sodium) .... As needed 5)  Polyethylene Glycol 3350 Powd (Polyethylene glycol 3350) .Marland Kitchen.. 1 capful mixed with water once a day to keep stool soft 6)  Ranitidine Hcl 150 Mg Tabs (Ranitidine hcl) .Marland Kitchen.. 1 tab at bedtime to  reduce acid and help swallowing 7)  Azithromycin 250 Mg Tabs (Azithromycin) .... 2 by  mouth today and then 1 daily for 4 days Prescriptions: AZITHROMYCIN 250 MG  TABS (AZITHROMYCIN) 2 by  mouth today and then 1 daily for 4 days  #6 x 0   Entered and Authorized by:   Hannah Beat MD   Signed by:   Hannah Beat MD on 10/12/2009   Method used:   Electronically to        [id number] (retail)       [address]       [place], [place]  [address]       Ph: [phone]       Fax: [phone]   RxID:   [id number]   Current Allergies (reviewed today): ! * IVP DYE ! PENICILLIN V POTASSIUM (PENICILLIN V POTASSIUM) ! PREDNISONE (PREDNISONE) CODEINE SULFATE (CODEINE SULFATE) ASPIRIN (ASPIRIN) PREVACID (LANSOPRAZOLE) SULFA HYZAAR (LOSARTAN POTASSIUM-HCTZ)

## 2010-02-07 NOTE — Assessment & Plan Note (Signed)
Summary: CONSTIPATED, FELL LAST NIGHT   Vital Signs:  Patient profile:   77 year old female Weight:      112 pounds Temp:     98.4 degrees F oral Pulse rate:   76 / minute Pulse rhythm:   regular BP sitting:   140 / 80  (left arm) Cuff size:   regular  Vitals Entered By: Sydell Axon LPN (October 31, 2009 4:36 PM) CC: Constipated and fell last night and hit left side of body   History of Present Illness: Seems to be better from the respiratory infection  Still having trouble with constipation Got lots of gas on miralax, so she has not continued Taking stool softener 1-2 per day using up to 4 ex-lax as needed ---this did help some Prefers to go every 2 days-- but it may be as long as a week Prune juice doesn't help Prefers not to use enema--very hard to do  Had bad dream last night, being chased by a bull, and she fell out of bed to left side no apparent injury  Allergies: 1)  ! * Ivp Dye 2)  ! Penicillin V Potassium (Penicillin V Potassium) 3)  ! Prednisone (Prednisone) 4)  Codeine Sulfate (Codeine Sulfate) 5)  Aspirin (Aspirin) 6)  Prevacid (Lansoprazole) 7)  Sulfa 8)  Hyzaar (Losartan Potassium-Hctz)  Past History:  Past medical, surgical, family and social histories (including risk factors) reviewed for relevance to current acute and chronic problems.  Past Medical History: Reviewed history from 09/21/2008 and no changes required. Leg edema Constipation LBBB Allergic rhinitis Anxiety  Past Surgical History: Reviewed history from 04/05/2006 and no changes required. Cholecystectomy-1960's Hemorrhoidectomy- late 1950's Hysterectomy- 1960's  Family History: Reviewed history from 11/22/2006 and no changes required. Mom died of colon cancer @76  Dad died of MI in 58's 2 - 1/2 brothers and 1- 1/2 sister No other CAD No HTN or DM Mat aunt had lung cancer No breast cancer  Social History: Reviewed history from 11/16/2008 and no changes  required. Widowed--2 children--1 son, 1 daughter Independent living at [facility] Retired--hosiery worker Former Smoker--very brief Alcohol use-no  Has living will. Sherry Fees-ex-DIL to make decisions for her. Requests DNR after discussion. No feeding tube  Review of Systems       weight is stable  eating okay though not much appetite  Physical Exam  General:  alert.  NAD Abdomen:  soft, non-tender, normal bowel sounds, and no masses.     Impression & Recommendations:  Problem # 1:  CONSTIPATION, CHRONIC (ICD-564.09) Assessment Deteriorated ongoing problems can't tolerate the miralax will try sorbitol and senna try mag citrate as needed if no stool in 4-5 days  The following medications were removed from the medication list:    Fleet Enema 7-19 Gm/126ml Enem (Sodium phosphates) ..... Use as needed if no stool in 3days    Stool Softener Laxative 8.6-50 Mg Tabs (Sennosides-docusate sodium) .Marland Kitchen... As needed    Polyethylene Glycol 3350 Powd (Polyethylene glycol 3350) .Marland Kitchen... 1 capful mixed with water once a day to keep stool soft Her updated medication list for this problem includes:    Senokot S 8.6-50 Mg Tabs (Sennosides-docusate sodium) .Marland Kitchen... 2 tabs by mouth two times a day to prevent constipation    Sorbitol 70 % Soln (Sorbitol) .Marland Kitchen... 2  ounces up to three times  daily to prevent constipatioin    Citrate of Magnesia Soln (Magnesium citrate) .Marland Kitchen... 1/2-1 bottle every 3-4 days as needed if no stool  Complete Medication List:  1)  Adult Aspirin Low Strength 81 Mg Tbdp (Aspirin) .... Take 1 tablet by mouth once a day 2)  Meclizine Hcl 12.5 Mg Tabs (Meclizine hcl) .Marland Kitchen.. 1 tab by mouth three times a day as needed for dizziness 3)  Senokot S 8.6-50 Mg Tabs (Sennosides-docusate sodium) .... 2 tabs by mouth two times a day to prevent constipation 4)  Sorbitol 70 % Soln (Sorbitol) .... 2  ounces up to three times  daily to prevent constipatioin 5)  Citrate of Magnesia Soln (Magnesium  citrate) .... 1/2-1 bottle every 3-4 days as needed if no stool  Patient Instructions: 1)  Please try the senna S  --  2 tabs two times a day 2)  Try over the counter sorbitol 2 ounces up to 3 times daily  3)  Both of these are to prevent constipation so take regularly 4)  If still no stool after 3-4 days, can try the liquid magnesium citrate 5)  Please keep the February appt   Orders Added: 1)  Est. Patient Level III [16109]    Current Allergies (reviewed today): ! * IVP DYE ! PENICILLIN V POTASSIUM (PENICILLIN V POTASSIUM) ! PREDNISONE (PREDNISONE) CODEINE SULFATE (CODEINE SULFATE) ASPIRIN (ASPIRIN) PREVACID (LANSOPRAZOLE) SULFA HYZAAR (LOSARTAN POTASSIUM-HCTZ)

## 2010-02-08 ENCOUNTER — Encounter: Payer: Self-pay | Admitting: Family Medicine

## 2010-02-09 NOTE — Assessment & Plan Note (Signed)
Summary: CONSTIPATION,RECTAL STENOSIS/CLE   Vital Signs:  Patient profile:   77 year old female Height:      60.5 inches Weight:      114.25 pounds BMI:     22.02 Temp:     98.6 degrees F oral Pulse rate:   68 / minute Pulse rhythm:   regular BP sitting:   150 / 80  (left arm) Cuff size:   regular  Vitals Entered By: Linde Gillis CMA Duncan Dull) (January 12, 2010 9:29 AM) CC: constipation   History of Present Illness: 77 yo with h/o chronic constipation.    Has tried multiple medications, very concerned that no medication is truly helping.  Most recently tried Citrate of Magnesia did not help and makes her feel sick to her stomach.    Couldn't tolerate miralax, made her feel very bloated.  Taking stool softener 1-2 per day using up to 4 ex-lax as needed  Prefers to go every 2 days-- but it may be as long as a week Prune juice doesn't help Cannot give herself an enema.  On Sorbitol as well.  Thinks she is "closing up."  Puts a glove on her finger every morning and stimulates her rectum 10 times, sometimes this helps.  No blood in stool.      Current Medications (verified): 1)  Adult Aspirin Low Strength 81 Mg  Tbdp (Aspirin) .... Take 1 Tablet By Mouth Once A Day 2)  Meclizine Hcl 12.5 Mg Tabs (Meclizine Hcl) .Marland Kitchen.. 1 Tab By Mouth Three Times A Day As Needed For Dizziness 3)  Senokot S 8.6-50 Mg Tabs (Sennosides-Docusate Sodium) .... 2 Tabs By Mouth Two Times A Day To Prevent Constipation 4)  Sorbitol 70 % Soln (Sorbitol) .... 2  Ounces Up To Three Times  Daily To Prevent Constipatioin 5)  Stool Softener 100 Mg Caps (Docusate Sodium) .... Take As Directed 6)  Glycerin (Adult) 2 Gm Supp (Glycerin (Laxative)) .Marland Kitchen.. 1 Suppository Per Rectum Two Times A Day As Needed Constipation  Allergies: 1)  ! * Ivp Dye 2)  ! Penicillin V Potassium (Penicillin V Potassium) 3)  ! Prednisone (Prednisone) 4)  Codeine Sulfate (Codeine Sulfate) 5)  Aspirin (Aspirin) 6)  Prevacid  (Lansoprazole) 7)  Sulfa 8)  Hyzaar (Losartan Potassium-Hctz)  Past History:  Past Medical History: Last updated: 09/21/2008 Leg edema Constipation LBBB Allergic rhinitis Anxiety  Past Surgical History: Last updated: 04/05/2006 Cholecystectomy-1960's Hemorrhoidectomy- late 1950's Hysterectomy- 1960's  Family History: Last updated: December 01, 2006 Mom died of colon cancer @76  Dad died of MI in 68's 2 - 1/2 brothers and 1- 1/2 sister No other CAD No HTN or DM Mat aunt had lung cancer No breast cancer  Social History: Last updated: 11/16/2008 Widowed--2 children--1 son, 1 daughter Independent living at [facility] Retired--hosiery worker Former Smoker--very brief Alcohol use-no  Has living will. Sherry Florentino-ex-DIL to make decisions for her. Requests DNR after discussion. No feeding tube  Risk Factors: Smoking Status: quit (12-01-2006)  Review of Systems      See HPI General:  Denies fever, loss of appetite, and malaise. GI:  Complains of constipation; denies abdominal pain and bloody stools.  Physical Exam  General:  alert.  NAD Abdomen:  soft, non-tender, normal bowel sounds, and no masses.   Rectal:  no external abnormalities, no hemorrhoids, normal sphincter tone, and no masses.   no anal stenosis no stool in vault Psych:  normal affect, talkative and pleasant    Impression & Recommendations:  Problem # 1:  CONSTIPATION, CHRONIC (ICD-564.09) Assessment Unchanged Time spent with patient 25 minutes, more than 50% of this time was spent counseling patient on constipation. This appears to be a chronic issue.  Will add Glycerin suppository.  No signs of rectal stenosis so I do no think she would benefit from GI referral.  Offered to her, she agreed we will try suppositories first.     Orders: Anoscopy (04540)  Complete Medication List: 1)  Adult Aspirin Low Strength 81 Mg Tbdp (Aspirin) .... Take 1 tablet by mouth once a day 2)  Meclizine Hcl 12.5 Mg Tabs  (Meclizine hcl) .Marland Kitchen.. 1 tab by mouth three times a day as needed for dizziness 3)  Senokot S 8.6-50 Mg Tabs (Sennosides-docusate sodium) .... 2 tabs by mouth two times a day to prevent constipation 4)  Sorbitol 70 % Soln (Sorbitol) .... 2  ounces up to three times  daily to prevent constipatioin 5)  Stool Softener 100 Mg Caps (Docusate sodium) .... Take as directed 6)  Glycerin (adult) 2 Gm Supp (Glycerin (laxative)) .Marland Kitchen.. 1 suppository per rectum two times a day as needed constipation Prescriptions: GLYCERIN (ADULT) 2 GM SUPP (GLYCERIN (LAXATIVE)) 1 suppository per rectum two times a day as needed constipation  #20 x 0   Entered and Authorized by:   Ruthe Mannan MD   Signed by:   Ruthe Mannan MD on 01/12/2010   Method used:   Print then Give to Patient   RxID:   [id number]    Orders Added: 1)  Anoscopy [46600] 2)  Est. Patient Level IV [08657]    Current Allergies (reviewed today): ! * IVP DYE ! PENICILLIN V POTASSIUM (PENICILLIN V POTASSIUM) ! PREDNISONE (PREDNISONE) CODEINE SULFATE (CODEINE SULFATE) ASPIRIN (ASPIRIN) PREVACID (LANSOPRAZOLE) SULFA HYZAAR (LOSARTAN POTASSIUM-HCTZ)

## 2010-03-07 NOTE — Letter (Signed)
Summary: [facility]  [facility]   Imported By: Lanelle Bal 02/27/2010 15:04:18  _____________________________________________________________________  External Attachment:    Type:   Image     Comment:   External Document

## 2010-03-23 ENCOUNTER — Encounter: Payer: Self-pay | Admitting: Family Medicine

## 2010-04-20 ENCOUNTER — Encounter: Payer: Self-pay | Admitting: Family Medicine

## 2010-04-20 ENCOUNTER — Ambulatory Visit (INDEPENDENT_AMBULATORY_CARE_PROVIDER_SITE_OTHER): Payer: Medicare Other | Admitting: Family Medicine

## 2010-04-20 DIAGNOSIS — K5909 Other constipation: Secondary | ICD-10-CM

## 2010-04-20 NOTE — Progress Notes (Signed)
77 yo with h/o chronic constipation.    Has tried multiple medications, very concerned that no medication is truly helping.  Two ER visits earlier in year for fecal impaction.  Long time user of cathartic laxatives.  Referred her to GI in February 2012.  Note reviewed and was advised to take 3 Senna tabs a day, two stool softeners and use a quarter cup of power pudding daily.  Advised to also increase exercise and fluid intake.   Also advised to try to relax rather than bare down when having a BM. Uses suppositories every 3 days if does not have a BM. Was advised to follow up in 4 weeks but she did not.  No blood in stool.  She is here today because she is anxious that she still needs to use suppositories or she cannot have a BM.  The PMH, PSH, Social History, Family History, Medications, and allergies have been reviewed in [facility], and have been updated if relevant.   Review of Systems       See HPI General:  Denies fever, loss of appetite, and malaise. GI:  Complains of constipation; denies abdominal pain and bloody stools.  Physical Exam BP 130/84  Pulse 88  Temp(Src) 98.3 F (36.8 C) (Oral)  Ht 4' 11.75" (1.518 m)  Wt 111 lb 1.9 oz (50.404 kg)  BMI 21.88 kg/m2 General:  alert.  NAD Abdomen:  soft, non-tender, normal bowel sounds, and no masses.   Rectal:  no external abnormalities, no hemorrhoids, normal sphincter tone, and no masses.   no anal stenosis no stool in vault Psych:  normal affect, talkative and pleasant

## 2010-04-20 NOTE — Assessment & Plan Note (Signed)
Improved but pt is still quite anxious about this. I explained that I have advised as much as I can in my scope of practice and she needs to follow up with GI if she still has concerns. She wants [name] to call and make this appointment. Christine Vance advised and she will call and schedule this appointment.

## 2010-06-12 ENCOUNTER — Ambulatory Visit (INDEPENDENT_AMBULATORY_CARE_PROVIDER_SITE_OTHER): Payer: Medicare Other | Admitting: Family Medicine

## 2010-06-12 ENCOUNTER — Encounter: Payer: Self-pay | Admitting: Family Medicine

## 2010-06-12 DIAGNOSIS — R030 Elevated blood-pressure reading, without diagnosis of hypertension: Secondary | ICD-10-CM

## 2010-06-12 DIAGNOSIS — K5909 Other constipation: Secondary | ICD-10-CM

## 2010-06-12 DIAGNOSIS — R0789 Other chest pain: Secondary | ICD-10-CM

## 2010-06-12 DIAGNOSIS — R002 Palpitations: Secondary | ICD-10-CM

## 2010-06-12 NOTE — Assessment & Plan Note (Signed)
?   Past hx of CAD. EKG shows no change from 11/12/2006, stable LBBB. Symptoms are more consistent with GI origin. Will treat her constipation with glycerin suppositories and start her on PPI for possible reflux.

## 2010-06-12 NOTE — Patient Instructions (Addendum)
Take glycerin suppository today for constipation. Start prilosec 20 mg daily for possible reflux causing burning in chest , may increase to 40 mg daily. Follow BPs at home and record. Follow up with primary MD in 1-2 week for BP check.  If severe chest pain or shortness of breath, go to ER.

## 2010-06-12 NOTE — Assessment & Plan Note (Signed)
May be due to pain and constipation. Will treat these first. She will follow BPs at home, follow up closely with PCP.

## 2010-06-12 NOTE — Progress Notes (Signed)
Subjective:    Patient ID: Christine Vance, female    DOB: July 02, 1920, 77 y.o.   MRN: [id number]  HPI 77 year old female with history of anxiety, palpitations and B leg edema presents with new onset of rapid heart beat, feelings of a wave coming over her. She then has burning in her chest. That is what is bothering her most. Tums helps. Symptoms started 2-3 weeks ago, occuring several times a day. Rapid heart beat lasts seconds, burning in chest last few minutes. No episodes today. No association with exertion.  No associated shortness of breath, no sweating, no new dizziness. New medicines: tried lactulose and sorbitol last week but stopped because it caused burning in her chest.  In last 2 days BP has been elevated at 162/74, 175/71 pulse 75-80  Told in past she has had 2 small heart attacks 10-15 years ago, never saw cardiology, no heart surgery. (NOT ON PMH LIST) No past history of high chol., HTN, DM, minimal remote former smoker. No family history of CAD, CVA.  Has not had BM since last week. Plans to take glycerin suppository.    Review of Systems  Constitutional: Negative for fever and fatigue.  HENT: Negative for ear pain.   Eyes: Negative for pain.  Respiratory: Negative for cough and wheezing.   Cardiovascular: Positive for leg swelling.  Gastrointestinal: Negative for nausea, abdominal pain, diarrhea and constipation.  Psychiatric/Behavioral: Negative for dysphoric mood and agitation. The patient is not nervous/anxious.        Objective:   Physical Exam  Constitutional: Vital signs are normal. She appears well-developed and well-nourished. She is cooperative.  Non-toxic appearance. She does not appear ill. No distress.       Elderly female in NAD  HENT:  Head: Normocephalic.  Right Ear: Hearing, tympanic membrane, external ear and ear canal normal. Tympanic membrane is not erythematous, not retracted and not bulging.  Left Ear: Hearing, tympanic membrane, external  ear and ear canal normal. Tympanic membrane is not erythematous, not retracted and not bulging.  Nose: No mucosal edema or rhinorrhea. Right sinus exhibits no maxillary sinus tenderness and no frontal sinus tenderness. Left sinus exhibits no maxillary sinus tenderness and no frontal sinus tenderness.  Mouth/Throat: Uvula is midline, oropharynx is clear and moist and mucous membranes are normal.  Eyes: Conjunctivae, EOM and lids are normal. Pupils are equal, round, and reactive to light. No foreign bodies found.  Neck: Trachea normal and normal range of motion. Neck supple. Carotid bruit is not present. No mass and no thyromegaly present.  Cardiovascular: Normal rate, regular rhythm, S1 normal, S2 normal, normal heart sounds, intact distal pulses and normal pulses.  Exam reveals no gallop and no friction rub.   No murmur heard.      B 1 plus pitting edema stable per pt  Pulmonary/Chest: Effort normal and breath sounds normal. Not tachypneic. No respiratory distress. She has no decreased breath sounds. She has no wheezes. She has no rhonchi. She has no rales.  Abdominal: Soft. Normal appearance and bowel sounds are normal. She exhibits no mass. There is no hepatosplenomegaly. There is tenderness in the right lower quadrant and left lower quadrant.  Neurological: She is alert.  Skin: Skin is warm, dry and intact. No rash noted.  Psychiatric: She has a normal mood and affect. Her speech is normal and behavior is normal. Judgment and thought content normal. Her mood appears not anxious. Cognition and memory are normal. She does not exhibit a depressed mood.  Assessment & Plan:

## 2010-06-13 ENCOUNTER — Telehealth: Payer: Self-pay | Admitting: *Deleted

## 2010-06-13 NOTE — Telephone Encounter (Signed)
Spoke with [name] and scheduled Ms. Lader to see Dr. Dayton Martes tomorrow at 10:15 here in the clinic.

## 2010-06-13 NOTE — Telephone Encounter (Signed)
Mandy called to let Dr. Dayton Martes know that patient seems to be obsessed with her bowel movements.  With that being said she stated that patient has been going back and forth for years complaining of not being able to have a bowel movement, she was referred to GI.  After several months patient didn't complain about any GI issues until this morning.  [name] stated that she is not having any bowel movements for over one week now, no urge to go, no pain, and some gas.  Also she stated that she is retaining fluid.  Please advise.

## 2010-06-13 NOTE — Telephone Encounter (Signed)
I know this has been a chronic issue, which is why I referred her to GI for further help. I really cannot see her today, but she can likely be worked in by someone else today or I can see her tomorrow.

## 2010-06-14 ENCOUNTER — Encounter: Payer: Self-pay | Admitting: Family Medicine

## 2010-06-14 ENCOUNTER — Ambulatory Visit (INDEPENDENT_AMBULATORY_CARE_PROVIDER_SITE_OTHER): Payer: Medicare Other | Admitting: Family Medicine

## 2010-06-14 VITALS — BP 140/72 | HR 55 | Temp 98.3°F | Wt 112.0 lb

## 2010-06-14 DIAGNOSIS — R5383 Other fatigue: Secondary | ICD-10-CM | POA: Insufficient documentation

## 2010-06-14 DIAGNOSIS — Z79899 Other long term (current) drug therapy: Secondary | ICD-10-CM

## 2010-06-14 DIAGNOSIS — K5909 Other constipation: Secondary | ICD-10-CM

## 2010-06-14 LAB — CBC WITH DIFFERENTIAL/PLATELET
Basophils Absolute: 0 10*3/uL (ref 0.0–0.1)
Basophils Relative: 0.6 % (ref 0.0–3.0)
Eosinophils Absolute: 0.1 10*3/uL (ref 0.0–0.7)
HCT: 37.3 % (ref 36.0–46.0)
Hemoglobin: 12.4 g/dL (ref 12.0–15.0)
Lymphs Abs: 2.3 10*3/uL (ref 0.7–4.0)
MCHC: 33.1 g/dL (ref 30.0–36.0)
Monocytes Relative: 8.1 % (ref 3.0–12.0)
Neutro Abs: 4.6 10*3/uL (ref 1.4–7.7)
RBC: 4.02 Mil/uL (ref 3.87–5.11)
RDW: 13.4 % (ref 11.5–14.6)

## 2010-06-14 LAB — BASIC METABOLIC PANEL
BUN: 13 mg/dL (ref 6–23)
CO2: 28 mEq/L (ref 19–32)
Calcium: 9.8 mg/dL (ref 8.4–10.5)
Creatinine, Ser: 1.2 mg/dL (ref 0.4–1.2)

## 2010-06-14 NOTE — Progress Notes (Signed)
77 yo with h/o chronic constipation.    Has tried multiple medications, very concerned that no medication is truly helping.  Two ER visits earlier in year for fecal impaction.  Long time user of cathartic laxatives.  Referred her to GI in February 2012.  Was advised to take 3 Senna tabs a day, two stool softeners and use a quarter cup of power pudding daily.  Advised to also increase exercise and fluid intake.   Also advised to try to relax rather than bare down when having a BM. Uses suppositories every 3 days if does not have a BM. Unclear when she last saw her GI doctor.  Per Christine Vance, has appt with [facility] on 07/05/2010.   Had BM at 3:09 this morning.    No blood in stool.  Per Christine Vance, pt more confused lately.   Pt agrees that she has low energy. No SOB, No CP.    The PMH, PSH, Social History, Family History, Medications, and allergies have been reviewed in [facility], and have been updated if relevant.   Review of Systems       See HPI General:  Denies fever, loss of appetite, and malaise. GI:  Complains of constipation; denies abdominal pain and bloody stools.  Physical Exam BP 140/72  Pulse 55  Temp(Src) 98.3 F (36.8 C) (Oral)  Wt 112 lb (50.803 kg) General:  alert.  NAD Abdomen:  soft, non-tender, normal bowel sounds, and no masses.   Psych:  normal affect, talkative and pleasant

## 2010-06-14 NOTE — Assessment & Plan Note (Signed)
Unchanged, chronic issue. Pt is very pre occupied with her BM and I question if she is having them more regularly but not considering them true "BMs." Will defer to GI since she is on medication that does appear to be working and her abdomen is very soft, NT with +BS today. Discussed with pt and [name], Charity fundraiser.

## 2010-06-14 NOTE — Assessment & Plan Note (Signed)
New. Exam unremarkable. Will check labs today to rule out reversible causes. Orders Placed This Encounter  Procedures  . B12  . CBC w/Diff  . Basic Metabolic Panel (BMET)  . TSH

## 2010-06-15 MED ORDER — CYANOCOBALAMIN 1000 MCG PO TABS: 1000.0000 ug | ORAL_TABLET | Freq: Every day | ORAL | Status: DC

## 2010-06-15 NOTE — Progress Notes (Signed)
Addended by: Dianne Dun on: 06/15/2010 12:29 PM   Modules accepted: Orders

## 2010-07-05 ENCOUNTER — Telehealth: Payer: Self-pay | Admitting: *Deleted

## 2010-07-05 NOTE — Telephone Encounter (Signed)
Ok that is ok as long as she is not having any headaches, chest pains or feeling bad. She can come in for BP check nurse visit if she would like.

## 2010-07-05 NOTE — Telephone Encounter (Signed)
Pt called to let you know that her BP was 159/83 at her GI appt this morning.

## 2010-07-06 NOTE — Telephone Encounter (Signed)
Patient advised as instructed via telephone.  She stated that she feels ok but will let us know if she needs to come in.

## 2010-07-07 ENCOUNTER — Ambulatory Visit: Payer: Medicare Other | Admitting: Family Medicine

## 2010-07-09 ENCOUNTER — Ambulatory Visit: Payer: Self-pay | Admitting: Internal Medicine

## 2010-07-14 ENCOUNTER — Telehealth: Payer: Self-pay | Admitting: *Deleted

## 2010-07-14 ENCOUNTER — Inpatient Hospital Stay: Payer: Self-pay | Admitting: Internal Medicine

## 2010-07-14 NOTE — Telephone Encounter (Signed)
Caller is inquiring about medication for Pt for cough & earache w/fatigue and weakness [x1.5 wks]. Caller states that nurse [Ashley] from [facility] @ [phone] had previously requested [do not see note in Epic]. Please advise if Pt needs OV for Rx.?

## 2010-07-14 NOTE — Telephone Encounter (Signed)
Talked with Morrie Sheldon. Pt is now reporting that she has had these symptoms for several weeks. Afebrile but her neighbor says she is weak and not eating. I [facility]. Morrie Sheldon agreed and she will discuss this with Ms. Housewright and call me back.

## 2010-07-19 DIAGNOSIS — E119 Type 2 diabetes mellitus without complications: Secondary | ICD-10-CM

## 2010-07-19 DIAGNOSIS — J180 Bronchopneumonia, unspecified organism: Secondary | ICD-10-CM

## 2010-07-19 DIAGNOSIS — K5909 Other constipation: Secondary | ICD-10-CM

## 2010-07-19 DIAGNOSIS — I10 Essential (primary) hypertension: Secondary | ICD-10-CM

## 2010-07-24 ENCOUNTER — Telehealth: Payer: Self-pay | Admitting: *Deleted

## 2010-07-24 NOTE — Telephone Encounter (Signed)
Patients daugther advised as instructed via telephone.  Christine Vance will continue Norvasc 5mg  daily.  Her daughter will call back to schedule follow up appt.  She stated that the last time she was here with her mom she had to wait for an extended period of time before they saw the doctor.  It wasn't Dr. Dayton Martes they saw but she just wanted to make [name] aware of this.

## 2010-07-24 NOTE — Telephone Encounter (Signed)
Noted  

## 2010-07-24 NOTE — Telephone Encounter (Signed)
Yes, we need to continue this. Please find out dosage.  Please schedule follow up visit this wekk.

## 2010-07-24 NOTE — Telephone Encounter (Signed)
Pt's daughter states pt was given norvasc while at [facility] but she was only given 30 with no refills.  She doesn't know if she is to continue this.  She is back home now, no longer at facility.   Also, daughter is asking when should pt come in for a follow up visit?

## 2010-08-01 ENCOUNTER — Encounter: Payer: Self-pay | Admitting: Family Medicine

## 2010-08-01 ENCOUNTER — Ambulatory Visit (INDEPENDENT_AMBULATORY_CARE_PROVIDER_SITE_OTHER): Payer: Medicare Other | Admitting: Family Medicine

## 2010-08-01 VITALS — BP 130/70 | HR 92 | Temp 97.6°F | Wt 110.2 lb

## 2010-08-01 DIAGNOSIS — I10 Essential (primary) hypertension: Secondary | ICD-10-CM

## 2010-08-01 DIAGNOSIS — J189 Pneumonia, unspecified organism: Secondary | ICD-10-CM

## 2010-08-01 LAB — BASIC METABOLIC PANEL
Calcium: 9.4 mg/dL (ref 8.4–10.5)
Creatinine, Ser: 1.1 mg/dL (ref 0.4–1.2)
GFR: 48.28 mL/min — ABNORMAL LOW (ref >60.00)
Glucose, Bld: 157 mg/dL — ABNORMAL HIGH (ref 70–99)
Sodium: 137 mEq/L (ref 135–145)

## 2010-08-01 MED ORDER — AMLODIPINE BESYLATE 5 MG PO TABS: 5.0000 mg | ORAL_TABLET | Freq: Every day | ORAL | Status: DC

## 2010-08-01 NOTE — Progress Notes (Signed)
  Subjective:    Patient ID: Christine Vance, female    DOB: [birth year], 77 y.o.   MRN: [id number]  HPI 77 year old female here for hospital follow up. Notes reviewed. Was admitted to [facility] 07/14/2010-07/17/2010 after she developed a worsening cough, fever and weakness at [facility] [place].    CXR consistent with LLL pneumonia. Was also hypertensive and started on Norvasc 5 mg daily.  In hospital, she received IV levaquin, transitioned to po levaquin prior to discharge.    [facility]. She has been walking 1/10 of a mile each day and feels like she is getting stronger every day.  Chronic constipation- Followed by Dr. Lajean Saver at [facility]. He started her on Lactulose which she feels has been helping.   Review of Systems  Constitutional: Negative for fever and fatigue.  HENT: Negative for ear pain.   Eyes: Negative for pain.  Respiratory: Negative for cough and wheezing.   Gastrointestinal: Negative for nausea, abdominal pain, diarrhea and constipation.  Psychiatric/Behavioral: Negative for dysphoric mood and agitation. The patient is not nervous/anxious.        Objective:   Physical Exam  BP 130/70  Pulse 92  Temp(Src) 97.6 F (36.4 C) (Oral)  Wt 110 lb 4 oz (50.009 kg)      Gen: Elderly female in NAD , color looks much better! HENT:  Head: Normocephalic.  Right Ear: Hearing, tympanic membrane, external ear and ear canal normal. Tympanic membrane is not erythematous, not retracted and not bulging.  Left Ear: Hearing, tympanic membrane, external ear and ear canal normal. Tympanic membrane is not erythematous, not retracted and not bulging.  Nose: No mucosal edema or rhinorrhea. Right sinus exhibits no maxillary sinus tenderness and no frontal sinus tenderness. Left sinus exhibits no maxillary sinus tenderness and no frontal sinus tenderness.  Mouth/Throat: Uvula is midline, oropharynx is clear and moist and mucous membranes are normal.    Eyes: Conjunctivae, EOM and lids are normal. Pupils are equal, round, and reactive to light. No foreign bodies found.  Neck: Trachea normal and normal range of motion. Neck supple. Carotid bruit is not present. No mass and no thyromegaly present.  Cardiovascular: Normal rate, regular rhythm, S1 normal, S2 normal, normal heart sounds, intact distal pulses and normal pulses.  Exam reveals no gallop and no friction rub.   No murmur heard.  Pulmonary/Chest: Effort normal and breath sounds normal. Not tachypneic. No respiratory distress. She has no decreased breath sounds. She has no wheezes. She has no rhonchi. She has no rales.  Abdominal: Soft. Normal appearance and bowel sounds are normal. She exhibits no mass. There is no hepatosplenomegaly. There is tenderness in the right lower quadrant and left lower quadrant.  Neurological: She is alert.  Skin: Skin is warm, dry and intact. No rash noted.  Psychiatric: She has a normal mood and affect. Her speech is normal and behavior is normal. Judgment and thought content normal. Her mood appears not anxious. Cognition and memory are normal. She does not exhibit a depressed mood.          Assessment & Plan:   1. Pneumonia  Improved.  No further intervention necessary   2. Hypertension       Improved.  Will check BMET today given that amlodipine and lactulose are relatively new for her.

## 2010-08-02 ENCOUNTER — Other Ambulatory Visit: Payer: Self-pay | Admitting: *Deleted

## 2010-08-02 MED ORDER — AMLODIPINE BESYLATE 5 MG PO TABS: 5.0000 mg | ORAL_TABLET | Freq: Every day | ORAL | Status: DC

## 2010-08-09 ENCOUNTER — Ambulatory Visit: Payer: Self-pay | Admitting: Internal Medicine

## 2010-08-18 ENCOUNTER — Other Ambulatory Visit: Payer: Self-pay | Admitting: *Deleted

## 2010-08-18 MED ORDER — AMLODIPINE BESYLATE 5 MG PO TABS: 5.0000 mg | ORAL_TABLET | Freq: Every day | ORAL | Status: DC

## 2010-11-17 ENCOUNTER — Emergency Department: Payer: Self-pay | Admitting: *Deleted

## 2010-11-20 DIAGNOSIS — M549 Dorsalgia, unspecified: Secondary | ICD-10-CM

## 2010-11-20 DIAGNOSIS — M25559 Pain in unspecified hip: Secondary | ICD-10-CM

## 2010-11-22 ENCOUNTER — Encounter: Payer: Self-pay | Admitting: Family Medicine

## 2010-11-22 DIAGNOSIS — I10 Essential (primary) hypertension: Secondary | ICD-10-CM

## 2010-11-22 DIAGNOSIS — IMO0002 Reserved for concepts with insufficient information to code with codable children: Secondary | ICD-10-CM

## 2010-11-22 DIAGNOSIS — M5126 Other intervertebral disc displacement, lumbar region: Secondary | ICD-10-CM

## 2010-11-22 DIAGNOSIS — E119 Type 2 diabetes mellitus without complications: Secondary | ICD-10-CM

## 2010-11-22 NOTE — Progress Notes (Signed)
Patient Active Problem List  Diagnoses  . ANXIETY  . ALLERGIC RHINITIS  . WALKING PNEUMONIA  . CONSTIPATION, CHRONIC  . UTI  . VERTIGO  . LEG EDEMA, BILATERAL  . HOARSENESS  . PALPITATIONS  . DYSPHAGIA PHARYNGOESOPHAGEAL PHASE  . OTHER ABNORMAL BLOOD CHEMISTRY  . FEVER, HX OF  . Atypical chest pain  . Elevated blood-pressure reading without diagnosis of hypertension  . Fatigue  . Pneumonia  . Hypertension   Past Medical History  Diagnosis Date  . Edema leg   . Constipation   . LBBB (left bundle branch block)   . Allergic rhinitis   . Anxiety    Past Surgical History  Procedure Date  . Cholecystectomy 1960's  . Hemorrhoid surgery late 1950's  . Other surgery 1960's    hysterectomy   History  Substance Use Topics  . Smoking status: Former Games developer  . Smokeless tobacco: Not on file   Comment: very brief  . Alcohol Use: No   Family History  Problem Relation Age of Onset  . Cancer Mother     colon  . Heart disease Father     MI  . Cancer Maternal Aunt     lung  . Diabetes Neg Hx   . Hypertension Neg Hx   . Coronary artery disease Neg Hx    Allergies  Allergen Reactions  . Codeine Sulfate   . Hyzaar   . Lansoprazole   . Penicillins   . Prednisone   . Sulfonamide Derivatives    Current Outpatient Prescriptions on File Prior to Visit  Medication Sig Dispense Refill  . amLODipine (NORVASC) 5 MG tablet Take 1 tablet (5 mg total) by mouth daily.  30 tablet  6  . aspirin 81 MG tablet Take 81 mg by mouth daily.        . cyanocobalamin ([employer] VITAMIN B12) 1000 MCG tablet Take 1 tablet (1,000 mcg total) by mouth daily.  100 tablet  2  . docusate sodium (COLACE) 100 MG capsule Take as directed       . Glycerin, Adult, (GLYCERIN, ADULT,) 2.1 G SUPP One suppository per rectum two times a day as needed for constipation       . omeprazole (PRILOSEC) 20 MG capsule Take 20 mg by mouth daily.        . sennosides-docusate sodium (SENOKOT-S) 8.6-50 MG tablet Take 2 tablets  by mouth daily. To prevent constipation       . sorbitol 70 % solution Take 2 ounces up to three times a day to prevent constipation

## 2010-11-27 DIAGNOSIS — R300 Dysuria: Secondary | ICD-10-CM

## 2010-11-27 DIAGNOSIS — G479 Sleep disorder, unspecified: Secondary | ICD-10-CM

## 2010-11-29 ENCOUNTER — Telehealth: Payer: Self-pay | Admitting: Family Medicine

## 2010-11-29 NOTE — Telephone Encounter (Signed)
Left message on voicemail for Lurena Joiner stating that our records show last pneumonia shot 01/09/1996.  It is listed as historical so doesn't look like we gave it here in the office.

## 2010-11-29 NOTE — Telephone Encounter (Signed)
Christine Vance at [facility] needs info on patient's pneumonia vaccine.  Please call back at [phone].

## 2010-12-05 ENCOUNTER — Other Ambulatory Visit: Payer: Self-pay | Admitting: Family Medicine

## 2010-12-05 ENCOUNTER — Telehealth: Payer: Self-pay | Admitting: Family Medicine

## 2010-12-05 MED ORDER — CEFUROXIME AXETIL 250 MG PO TABS: 500.0000 mg | ORAL_TABLET | Freq: Two times a day (BID) | ORAL | Status: AC

## 2010-12-05 NOTE — Telephone Encounter (Signed)
Pharmacist at [facility] called to clarify pt's ceftin script.  Script says to take 2 twice a day, per Dr. Dayton Martes it should be for 1 two times a day.  Advised pharmacist.

## 2010-12-07 ENCOUNTER — Telehealth: Payer: Self-pay | Admitting: *Deleted

## 2010-12-07 NOTE — Telephone Encounter (Signed)
I am not quite sure since this was given to her in nursing home. Please have her schedule face to face follow up to discuss and bring updated med list.

## 2010-12-07 NOTE — Telephone Encounter (Signed)
POA is concerned about pt's meds, she was given a blister pack of abx and instructed to take 2 tabs daily, pt has been taking that dose then POA read instructions on the pack and it stated to take 2 tabs the first day then 1 tab daily after that. She is concerned pt may not have enough abx and thinks she needs more meds. She states pt has bee "peeing buckets"

## 2010-12-07 NOTE — Telephone Encounter (Signed)
Left message on cell phone voicemail for Christine Vance to return call.

## 2010-12-08 NOTE — Telephone Encounter (Signed)
Sheree advised as instructed via telephone.  Patient has an appt on 12/21/2010.

## 2010-12-15 ENCOUNTER — Telehealth: Payer: Self-pay | Admitting: Family Medicine

## 2010-12-15 ENCOUNTER — Emergency Department: Payer: Self-pay | Admitting: Emergency Medicine

## 2010-12-15 NOTE — Telephone Encounter (Signed)
Received a call from [place]. Ms. Viall called Mandy at 2:45 upset because she had not had a BM all week. She has taken MOM, senekot, prune juice. Patient is complaining of severe abdominal pain. Mandy not comfortable giving her an enema in this situation. She wants to send her to ER, daughter in law agrees. I agree with this plan.

## 2010-12-18 ENCOUNTER — Telehealth: Payer: Self-pay | Admitting: Internal Medicine

## 2010-12-18 ENCOUNTER — Ambulatory Visit: Payer: Medicare Other | Admitting: Family Medicine

## 2010-12-18 DIAGNOSIS — K5909 Other constipation: Secondary | ICD-10-CM

## 2010-12-18 DIAGNOSIS — N390 Urinary tract infection, site not specified: Secondary | ICD-10-CM

## 2010-12-18 NOTE — Telephone Encounter (Signed)
Called and wanted you to know that patient didn't go to the ER just for bowel movement also her blood pressure was elevated and they had to catheterize her because she couldn't urinate due to the impaction.  Patient has an appt. On 12/21/10.

## 2010-12-20 ENCOUNTER — Telehealth: Payer: Self-pay | Admitting: Internal Medicine

## 2010-12-20 DIAGNOSIS — R4182 Altered mental status, unspecified: Secondary | ICD-10-CM

## 2010-12-20 DIAGNOSIS — R531 Weakness: Secondary | ICD-10-CM | POA: Insufficient documentation

## 2010-12-20 DIAGNOSIS — K5909 Other constipation: Secondary | ICD-10-CM

## 2010-12-20 DIAGNOSIS — R5383 Other fatigue: Secondary | ICD-10-CM

## 2010-12-20 DIAGNOSIS — R5381 Other malaise: Secondary | ICD-10-CM

## 2010-12-20 NOTE — Telephone Encounter (Signed)
Triage Record Num: [id number] Operator: Valene Bors Patient Name: Christine Vance Call Date & Time: 12/17/2010 3:24:36PM Patient Phone: [phone] PCP: Ruthe Mannan Patient Gender: Female PCP Fax : [phone] Patient DOB: 01/14/1920 Practice Name: [facility] Reason for Call: Caller: Laura/LPN; PCP: Ruthe Mannan Nestor Ramp); CB#: [phone]; Henny is Respite Patient and only med on list ASA. Call regarding UTI/ c/o Kidneys burning. She is having burning with urination- is usually continent but is wearing Attends d/t frequent pasty stoolsevery hour), temp= 98.7 AX and she appears weak. She was disimpacted in the hospital and now her bottom is sore with Hemhorroids. She has abdominal tenderness and some distension and back hurting- fell on 12/15/10. Admitted today to facility-12/17/10. She states she urinated x1 today but is confused, oriented to person and place only. BP=178/82, pulse wnl, Resp 16. She had a foley in hospital and U/A done on 10/15/10 negative. Care advice per Flank Pain Protocol and MD on call contacted, Dr. Milinda Cave and he advised to give Tucks and Anusol PRN and to follow standing orders for UTI Sx: U/A and C&S ordered and for her to start on Ceftin 250 mgs 1 PO BID x 7 days or until C&S reported. Advised to f/u with PCP on 12/18/10. Protocol(s) Used: Back Symptoms Protocol(s) Used: Flank Pain Recommended Outcome per Protocol: See Provider within 4 hours Override Outcome if Used in Protocol: Call Provider Immediately RN Reason for Override Outcome: Office Is Closed. Reason for Outcome: Pain predominately in flank area Flank pain or low back pain AND urinary tract symptoms Care Advice: ~ SYMPTOM / CONDITION MANAGEMENT 12

## 2010-12-21 ENCOUNTER — Ambulatory Visit: Payer: Medicare Other | Admitting: Family Medicine

## 2010-12-28 ENCOUNTER — Telehealth: Payer: Self-pay | Admitting: Family Medicine

## 2010-12-28 NOTE — Telephone Encounter (Signed)
Please call [facility] (poa) after you see Madysyn on Friday.  Cordelia Pen can be contacted at [phone].

## 2010-12-29 DIAGNOSIS — K5649 Other impaction of intestine: Secondary | ICD-10-CM

## 2010-12-29 DIAGNOSIS — R4182 Altered mental status, unspecified: Secondary | ICD-10-CM

## 2010-12-29 NOTE — Telephone Encounter (Signed)
I already spoke with her this morning 

## 2011-01-03 DIAGNOSIS — L03019 Cellulitis of unspecified finger: Secondary | ICD-10-CM

## 2011-01-14 ENCOUNTER — Emergency Department: Payer: Self-pay | Admitting: Unknown Physician Specialty

## 2011-01-14 LAB — COMPREHENSIVE METABOLIC PANEL
Albumin: 3.2 g/dL — ABNORMAL LOW (ref 3.4–5.0)
Anion Gap: 10 (ref 7–16)
BUN: 17 mg/dL (ref 7–18)
Bilirubin,Total: 0.7 mg/dL (ref 0.2–1.0)
Chloride: 100 mmol/L (ref 98–107)
Creatinine: 1.08 mg/dL (ref 0.60–1.30)
EGFR (African American): >60
Glucose: 119 mg/dL — ABNORMAL HIGH (ref 65–99)
Osmolality: 282 (ref 275–301)
Potassium: 3.6 mmol/L (ref 3.5–5.1)
SGOT(AST): 20 U/L (ref 15–37)
Sodium: 140 mmol/L (ref 136–145)
Total Protein: 6.6 g/dL (ref 6.4–8.2)

## 2011-01-14 LAB — CBC
HCT: 35.7 % (ref 35.0–47.0)
HGB: 12.3 g/dL (ref 12.0–16.0)
MCH: 31.4 pg (ref 26.0–34.0)
MCV: 91 fL (ref 80–100)
Platelet: 516 10*3/uL — ABNORMAL HIGH (ref 150–440)
RBC: 3.93 10*6/uL (ref 3.80–5.20)

## 2011-01-14 LAB — URINALYSIS, COMPLETE
Blood: NEGATIVE
Glucose,UR: NEGATIVE mg/dL (ref 0–75)
Hyaline Cast: 1
Ketone: NEGATIVE
Nitrite: NEGATIVE
Protein: 30
RBC,UR: <1 /HPF (ref 0–5)
WBC UR: 1 /HPF (ref 0–5)

## 2011-01-14 LAB — PRO B NATRIURETIC PEPTIDE: B-Type Natriuretic Peptide: 2814 pg/mL — ABNORMAL HIGH (ref 0–450)

## 2011-01-14 LAB — TSH: Thyroid Stimulating Horm: 1.48 u[IU]/mL

## 2011-01-14 LAB — TROPONIN I: Troponin-I: <0.02 ng/mL

## 2011-01-14 LAB — MAGNESIUM: Magnesium: 1.7 mg/dL — ABNORMAL LOW

## 2011-01-24 ENCOUNTER — Ambulatory Visit: Payer: Medicare Other | Admitting: Family Medicine

## 2011-01-29 ENCOUNTER — Ambulatory Visit (INDEPENDENT_AMBULATORY_CARE_PROVIDER_SITE_OTHER): Payer: Medicare Other | Admitting: Family Medicine

## 2011-01-29 ENCOUNTER — Encounter: Payer: Self-pay | Admitting: Family Medicine

## 2011-01-29 VITALS — BP 122/80 | HR 90 | Temp 97.7°F | Wt 102.8 lb

## 2011-01-29 DIAGNOSIS — L89159 Pressure ulcer of sacral region, unspecified stage: Secondary | ICD-10-CM

## 2011-01-29 DIAGNOSIS — L8990 Pressure ulcer of unspecified site, unspecified stage: Secondary | ICD-10-CM

## 2011-01-29 DIAGNOSIS — R531 Weakness: Secondary | ICD-10-CM

## 2011-01-29 DIAGNOSIS — R5381 Other malaise: Secondary | ICD-10-CM

## 2011-01-29 DIAGNOSIS — L89109 Pressure ulcer of unspecified part of back, unspecified stage: Secondary | ICD-10-CM

## 2011-01-29 NOTE — Progress Notes (Signed)
  Subjective:    Patient ID: Christine Vance, female    DOB: November 27, 1920, 78 y.o.   MRN: [id number]  HPI 78 year old female here for SNF follow up.  Was living at [facility] [place] and admitted to hospital for confusion and UTI. She was then transferred to [facility] for weakness.  She had regained some strength but still unsteady on her feet. DIL transferred her to the [place]. She is hear today with an aide.  She has no complaints but states that her bottom hurts.  No fevers.  Not walking much at new facility.  Review of Systems  Constitutional: Negative for fever and fatigue.  HENT: Negative for ear pain.   Eyes: Negative for pain.  Respiratory: Negative for cough and wheezing.   Gastrointestinal: Negative for nausea, abdominal pain, diarrhea and constipation.  Psychiatric/Behavioral: Negative for dysphoric mood and agitation. The patient is not nervous/anxious.        Objective:   Physical Exam  BP 122/80  Pulse 90  Temp(Src) 97.7 F (36.5 C) (Oral)  Wt 102 lb 12 oz (46.607 kg) Wt Readings from Last 3 Encounters:  01/29/11 102 lb 12 oz (46.607 kg)  08/01/10 110 lb 4 oz (50.009 kg)  06/14/10 112 lb (50.803 kg)       Gen: Thin, Elderly female in NAD  HENT:  Head: Normocephalic.  Right Ear: Hearing, tympanic membrane, external ear and ear canal normal. Tympanic membrane is not erythematous, not retracted and not bulging.  Left Ear: Hearing, tympanic membrane, external ear and ear canal normal. Tympanic membrane is not erythematous, not retracted and not bulging.  Nose: No mucosal edema or rhinorrhea. Right sinus exhibits no maxillary sinus tenderness and no frontal sinus tenderness. Left sinus exhibits no maxillary sinus tenderness and no frontal sinus tenderness.  Mouth/Throat: Uvula is midline, oropharynx is clear and moist and mucous membranes are normal.  Eyes: Conjunctivae, EOM and lids are normal. Pupils are equal, round, and reactive to light. No foreign bodies  found.  Neck: Trachea normal and normal range of motion. Neck supple. Carotid bruit is not present. No mass and no thyromegaly present.  Cardiovascular: Normal rate, regular rhythm, S1 normal, S2 normal, normal heart sounds, intact distal pulses and normal pulses.  Exam reveals no gallop and no friction rub.   No murmur heard.  Pulmonary/Chest: Effort normal and breath sounds normal. Not tachypneic. No respiratory distress. She has no decreased breath sounds. She has no wheezes. She has no rhonchi. She has no rales.  Neurological: She is alert.  Skin: Stage I sacral decubitus ulcer  Psychiatric: She has a normal mood and affect. Her speech is normal and behavior is normal. Judgment and thought content normal. Her mood appears not anxious. Cognition and memory are normal. She does not exhibit a depressed mood.  Ext:  1+ bilateral pedal edema.      Assessment & Plan:   1. Sacral decubitus ulcer  Rx written for wound consult- and hydrocolloid dressing to sacral wound. Also asked that while sitting, she have a cushion or donut pillow. Start nutritional protein supplement. Orders returned to aid.  2. Weakness  Improved but still thin and not walking steadily. PT/OT consult written.

## 2011-01-29 NOTE — Patient Instructions (Signed)
See order(s).

## 2011-02-01 ENCOUNTER — Telehealth: Payer: Self-pay

## 2011-02-01 NOTE — Telephone Encounter (Signed)
Debra with [facility] called pt was added to the [facility] yesterday. Debra request verbal order for adding nursing for wound care; pt has pressure sore stage 2 on sacrum. Please call Debra at [phone] with verbal order.

## 2011-02-01 NOTE — Telephone Encounter (Signed)
Yes ok to order wound care.

## 2011-02-01 NOTE — Telephone Encounter (Signed)
Left message on cell phone voicemail advising Stanton Kidney as instructed.

## 2011-02-05 ENCOUNTER — Telehealth: Payer: Self-pay | Admitting: Family Medicine

## 2011-02-05 NOTE — Telephone Encounter (Signed)
We addressed her ulcer on her bottom and ordered physical therapy.

## 2011-02-05 NOTE — Telephone Encounter (Signed)
Sherri Mashaw is calling about Christine Vance, she says she is power of attorney, and wanted to know what all was said and the diagnosis of Christine Vance last visit.

## 2011-02-06 NOTE — Telephone Encounter (Signed)
Sherri Huskins notified as instructed by telephone.

## 2011-02-13 ENCOUNTER — Ambulatory Visit (INDEPENDENT_AMBULATORY_CARE_PROVIDER_SITE_OTHER): Payer: Medicare Other | Admitting: Family Medicine

## 2011-02-13 ENCOUNTER — Encounter: Payer: Self-pay | Admitting: Family Medicine

## 2011-02-13 VITALS — BP 132/80 | HR 60 | Temp 97.9°F

## 2011-02-13 DIAGNOSIS — R600 Localized edema: Secondary | ICD-10-CM

## 2011-02-13 DIAGNOSIS — R609 Edema, unspecified: Secondary | ICD-10-CM

## 2011-02-13 LAB — BASIC METABOLIC PANEL
CO2: 27 mEq/L (ref 19–32)
Calcium: 9.5 mg/dL (ref 8.4–10.5)
Chloride: 103 mEq/L (ref 96–112)
Sodium: 138 mEq/L (ref 135–145)

## 2011-02-13 LAB — CBC WITH DIFFERENTIAL/PLATELET
Basophils Relative: 1.3 % (ref 0.0–3.0)
Eosinophils Absolute: 0.2 10*3/uL (ref 0.0–0.7)
Hemoglobin: 11.9 g/dL — ABNORMAL LOW (ref 12.0–15.0)
Lymphocytes Relative: 24.2 % (ref 12.0–46.0)
MCHC: 33.4 g/dL (ref 30.0–36.0)
MCV: 90.4 fl (ref 78.0–100.0)
Neutro Abs: 4.5 10*3/uL (ref 1.4–7.7)
RBC: 3.93 Mil/uL (ref 3.87–5.11)

## 2011-02-13 LAB — BRAIN NATRIURETIC PEPTIDE: Pro B Natriuretic peptide (BNP): 175 pg/mL — ABNORMAL HIGH (ref 0.0–100.0)

## 2011-02-13 NOTE — Patient Instructions (Signed)
We are checking some lab work and will call tomorrow with results. Please walk daily with walker and with assistance.

## 2011-02-13 NOTE — Progress Notes (Signed)
Subjective:    Patient ID: Christine Vance, female    DOB: 1920/06/25, 78 y.o.   MRN: [id number]  HPI 78 year old female here for SNF follow up.  Was living at [facility] [place] and admitted to hospital for confusion and UTI. She was then transferred to [facility] for weakness.  She had regained some strength but still unsteady on her feet. Daughter in law (HPOA) transferred her to the [facility].  She is here today with her daughter in law for bilateral lower extremity edema.  Not walking much at new facility.  Sacral decub is healing now that wound care nurses are caring for her.    She has no complaints but states that her bottom hurts. No CP, No SOB. No fevers.  Daughter in law feels swelling has worsened over past few weeks.  No h/o CHF Patient Active Problem List  Diagnoses  . ANXIETY  . ALLERGIC RHINITIS  . WALKING PNEUMONIA  . CONSTIPATION, CHRONIC  . UTI  . VERTIGO  . LEG EDEMA, BILATERAL  . HOARSENESS  . PALPITATIONS  . DYSPHAGIA PHARYNGOESOPHAGEAL PHASE  . OTHER ABNORMAL BLOOD CHEMISTRY  . FEVER, HX OF  . Atypical chest pain  . Elevated blood-pressure reading without diagnosis of hypertension  . Fatigue  . Pneumonia  . Hypertension  . Weakness  . Sacral decubitus ulcer  . Lower extremity edema   Past Medical History  Diagnosis Date  . Edema leg   . Constipation   . LBBB (left bundle branch block)   . Allergic rhinitis   . Anxiety    Past Surgical History  Procedure Date  . Cholecystectomy 1960's  . Hemorrhoid surgery late 1950's  . Other surgery 1960's    hysterectomy   History  Substance Use Topics  . Smoking status: Former Games developer  . Smokeless tobacco: Not on file   Comment: very brief  . Alcohol Use: No   Family History  Problem Relation Age of Onset  . Cancer Mother     colon  . Heart disease Father     MI  . Cancer Maternal Aunt     lung  . Diabetes Neg Hx   . Hypertension Neg Hx   . Coronary artery disease Neg Hx    Allergies    Allergen Reactions  . Codeine Sulfate   . Hyzaar   . Lansoprazole   . Penicillins   . Prednisone   . Sulfonamide Derivatives    Current Outpatient Prescriptions on File Prior to Visit  Medication Sig Dispense Refill  . acetaminophen (TYLENOL) 650 MG CR tablet Two tablets by mouth two times a day as needed      . aspirin 81 MG tablet Take 81 mg by mouth daily.        . magnesium hydroxide (MILK OF MAGNESIA) 400 MG/5ML suspension Take 30cc daily as needed      . meloxicam (MOBIC) 7.5 MG tablet Take 7.5 mg by mouth daily.      Marland Kitchen Phenylephrine-DM-GG (QC TUSSIN CF) 5-10-100 MG/5ML LIQD 15cc every six hours as needed for cough      . sennosides-docusate sodium (SENOKOT-S) 8.6-50 MG tablet Take 1 tablet by mouth daily. To prevent constipation       The PMH, PSH, Social History, Family History, Medications, and allergies have been reviewed in [facility], and have been updated if relevant.    Review of Systems  Constitutional: Negative for fever and fatigue.  HENT: Negative for ear pain.   Eyes:  Negative for pain.  Respiratory: Negative for cough and wheezing.   Gastrointestinal: Negative for nausea, abdominal pain, diarrhea and constipation.  Psychiatric/Behavioral: Negative for dysphoric mood and agitation. The patient is not nervous/anxious.        Objective:   Physical Exam  BP 132/80  Pulse 60  Temp(Src) 97.9 F (36.6 C) (Oral)   Gen: Thin, Elderly female in NAD  HENT:  Head: Normocephalic.  Right Ear: Hearing, tympanic membrane, external ear and ear canal normal. Tympanic membrane is not erythematous, not retracted and not bulging.  Left Ear: Hearing, tympanic membrane, external ear and ear canal normal. Tympanic membrane is not erythematous, not retracted and not bulging.  Nose: No mucosal edema or rhinorrhea. Right sinus exhibits no maxillary sinus tenderness and no frontal sinus tenderness. Left sinus exhibits no maxillary sinus tenderness and no frontal sinus tenderness.   Mouth/Throat: Uvula is midline, oropharynx is clear and moist and mucous membranes are normal.  Eyes: Conjunctivae, EOM and lids are normal. Pupils are equal, round, and reactive to light. No foreign bodies found.  Neck: Trachea normal and normal range of motion. Neck supple. Carotid bruit is not present. No mass and no thyromegaly present.  Cardiovascular: Normal rate, regular rhythm, S1 normal, S2 normal, normal heart sounds, intact distal pulses and normal pulses.  Exam reveals no gallop and no friction rub.   No murmur heard.  Pulmonary/Chest: Effort normal and breath sounds normal. Not tachypneic. No respiratory distress. She has no decreased breath sounds. She has no wheezes. She has no rhonchi. She has no rales.  Neurological: She is alert.  Skin: Stage I sacral decubitus ulcer  Psychiatric: She has a normal mood and affect. Her speech is normal and behavior is normal. Judgment and thought content normal. Her mood appears not anxious. Cognition and memory are normal. She does not exhibit a depressed mood.  Ext:  2+ bilateral pedal edema.      Assessment & Plan:   1. Lower extremity edema  Basic Metabolic Panel (BMET), CBC w/Diff, TSH, B Nat Peptide   Deteriorated.  Likely multifactorial, mainly dependent from lack of exercise. Ordered daily walking with assistance and with walker (with seat)- rx given for walker with seat. Low salt diet.   Also ordered leg elevation while seated. Will check labs to rule out other possible contributing factors.

## 2011-02-16 DIAGNOSIS — L89109 Pressure ulcer of unspecified part of back, unspecified stage: Secondary | ICD-10-CM

## 2011-02-16 DIAGNOSIS — R627 Adult failure to thrive: Secondary | ICD-10-CM

## 2011-02-16 DIAGNOSIS — L8992 Pressure ulcer of unspecified site, stage 2: Secondary | ICD-10-CM

## 2011-02-21 ENCOUNTER — Telehealth: Payer: Self-pay | Admitting: *Deleted

## 2011-02-21 NOTE — Telephone Encounter (Signed)
Please give lasix 10 mg daily for three days.

## 2011-02-21 NOTE — Telephone Encounter (Signed)
Left message on personal cell phone voicemail advising Jasmine December as instructed via telephone.

## 2011-02-21 NOTE — Telephone Encounter (Signed)
Jasmine December called to let Dr. Dayton Martes know that patient has 4+ pitting in her lower extremities up to her knees.  Patient was put on Lasix 10mg  for three days but facility only gave her 5mg  for three days.  Please advise.

## 2011-03-05 ENCOUNTER — Encounter: Payer: Self-pay | Admitting: Family Medicine

## 2011-03-05 ENCOUNTER — Ambulatory Visit (INDEPENDENT_AMBULATORY_CARE_PROVIDER_SITE_OTHER): Payer: Medicare Other | Admitting: Family Medicine

## 2011-03-05 VITALS — BP 142/88 | HR 88 | Temp 97.9°F | Wt 105.0 lb

## 2011-03-05 DIAGNOSIS — L89159 Pressure ulcer of sacral region, unspecified stage: Secondary | ICD-10-CM

## 2011-03-05 DIAGNOSIS — R600 Localized edema: Secondary | ICD-10-CM

## 2011-03-05 DIAGNOSIS — R609 Edema, unspecified: Secondary | ICD-10-CM

## 2011-03-05 NOTE — Progress Notes (Signed)
Subjective:    Patient ID: Christine Vance, female    DOB: 08/17/1920, 78 y.o.   MRN: [id number]  HPI 78 year old female here for 3 week follow up.  Was living at [facility] [place] and admitted to hospital for confusion and UTI. She was then transferred to [facility] for weakness.  She had regained some strength but still unsteady on her feet. Daughter in law (HPOA) transferred her to the [place].  She is here today with Kennyth Arnold, activity director at the [facility] for follow up of  bilateral lower extremity edema.  No h/o CHF  Given 3 days of Lasix 10 mg daily on 02/21/2011. Edema is markedly better, walking more and keeping legs elevated.  No CP or SOB.   Patient Active Problem List  Diagnoses  . ANXIETY  . ALLERGIC RHINITIS  . WALKING PNEUMONIA  . CONSTIPATION, CHRONIC  . UTI  . VERTIGO  . LEG EDEMA, BILATERAL  . HOARSENESS  . PALPITATIONS  . DYSPHAGIA PHARYNGOESOPHAGEAL PHASE  . OTHER ABNORMAL BLOOD CHEMISTRY  . FEVER, HX OF  . Atypical chest pain  . Elevated blood-pressure reading without diagnosis of hypertension  . Fatigue  . Pneumonia  . Hypertension  . Weakness  . Sacral decubitus ulcer  . Lower extremity edema   Past Medical History  Diagnosis Date  . Edema leg   . Constipation   . LBBB (left bundle branch block)   . Allergic rhinitis   . Anxiety    Past Surgical History  Procedure Date  . Cholecystectomy 1960's  . Hemorrhoid surgery late 1950's  . Other surgery 1960's    hysterectomy   History  Substance Use Topics  . Smoking status: Former Games developer  . Smokeless tobacco: Not on file   Comment: very brief  . Alcohol Use: No   Family History  Problem Relation Age of Onset  . Cancer Mother     colon  . Heart disease Father     MI  . Cancer Maternal Aunt     lung  . Diabetes Neg Hx   . Hypertension Neg Hx   . Coronary artery disease Neg Hx    Allergies  Allergen Reactions  . Codeine Sulfate   . Hyzaar   . Lansoprazole   . Penicillins   .  Prednisone   . Sulfonamide Derivatives    Current Outpatient Prescriptions on File Prior to Visit  Medication Sig Dispense Refill  . acetaminophen (TYLENOL) 650 MG CR tablet Two tablets by mouth two times a day as needed      . aspirin 81 MG tablet Take 81 mg by mouth daily.        . Hydroactive Dressings (TEGADERM HYDROCOLLOID EX) Apply to sacrum every three days      . magnesium hydroxide (MILK OF MAGNESIA) 400 MG/5ML suspension Take 30cc daily as needed      . meloxicam (MOBIC) 7.5 MG tablet Take 7.5 mg by mouth daily.      . Nutritional Supplements (ENSURE ENLIVE) LIQD Take 1 Can by mouth 2 (two) times daily.      Marland Kitchen Phenylephrine-DM-GG (QC TUSSIN CF) 5-10-100 MG/5ML LIQD 15cc every six hours as needed for cough      . sennosides-docusate sodium (SENOKOT-S) 8.6-50 MG tablet Take 1 tablet by mouth daily. To prevent constipation       The PMH, PSH, Social History, Family History, Medications, and allergies have been reviewed in [facility], and have been updated if relevant.  Review of Systems  Constitutional: Negative for fever and fatigue.  HENT: Negative for ear pain.   Eyes: Negative for pain.  Respiratory: Negative for cough and wheezing.   Gastrointestinal: Negative for nausea, abdominal pain, diarrhea and constipation.  Psychiatric/Behavioral: Negative for dysphoric mood and agitation. The patient is not nervous/anxious.        Objective:   Physical Exam  BP 142/88  Pulse 88  Temp(Src) 97.9 F (36.6 C) (Oral)  Wt 105 lb (47.628 kg) Wt Readings from Last 3 Encounters:  03/05/11 105 lb (47.628 kg)  01/29/11 102 lb 12 oz (46.607 kg)  08/01/10 110 lb 4 oz (50.009 kg)    Gen: Thin, Elderly female in NAD  HENT:  Head: Normocephalic.  Right Ear: Hearing, tympanic membrane, external ear and ear canal normal. Tympanic membrane is not erythematous, not retracted and not bulging.  Left Ear: Hearing, tympanic membrane, external ear and ear canal normal. Tympanic membrane is not  erythematous, not retracted and not bulging.  Nose: No mucosal edema or rhinorrhea. Right sinus exhibits no maxillary sinus tenderness and no frontal sinus tenderness. Left sinus exhibits no maxillary sinus tenderness and no frontal sinus tenderness.  Mouth/Throat: Uvula is midline, oropharynx is clear and moist and mucous membranes are normal.  Eyes: Conjunctivae, EOM and lids are normal. Pupils are equal, round, and reactive to light. No foreign bodies found.  Neck: Trachea normal and normal range of motion. Neck supple. Carotid bruit is not present. No mass and no thyromegaly present.  Cardiovascular: Normal rate, regular rhythm, S1 normal, S2 normal, normal heart sounds, intact distal pulses and normal pulses.  Exam reveals no gallop and no friction rub.   No murmur heard.  Pulmonary/Chest: Effort normal and breath sounds normal. Not tachypneic. No respiratory distress. She has no decreased breath sounds. She has no wheezes. She has no rhonchi. She has no rales.  Neurological: She is alert.  Skin: Stage I sacral decubitus ulcer  Psychiatric: She has a normal mood and affect. Her speech is normal and behavior is normal. Judgment and thought content normal. Her mood appears not anxious. Cognition and memory are normal. She does not exhibit a depressed mood.  Ext:  1+ bilateral pitting edema.    Assessment & Plan:   1. Lower extremity edema    Improved.   Likely multifactorial, mainly dependent from lack of exercise. Ordered daily walking with assistance and with walker (with seat)- rx given for walker with seat  And low salt diet 3 weeks ago.   Will continue this. The patient indicates understanding of these issues and agrees with the plan.

## 2011-03-22 ENCOUNTER — Emergency Department: Payer: Self-pay | Admitting: Emergency Medicine

## 2011-03-22 LAB — COMPREHENSIVE METABOLIC PANEL
Anion Gap: 9 (ref 7–16)
BUN: 21 mg/dL — ABNORMAL HIGH (ref 7–18)
Bilirubin,Total: 1 mg/dL (ref 0.2–1.0)
Chloride: 98 mmol/L (ref 98–107)
Creatinine: 1.02 mg/dL (ref 0.60–1.30)
Potassium: 4.3 mmol/L (ref 3.5–5.1)
Total Protein: 8 g/dL (ref 6.4–8.2)

## 2011-03-22 LAB — CBC
HCT: 42.2 % (ref 35.0–47.0)
MCHC: 32.3 g/dL (ref 32.0–36.0)
MCV: 90 fL (ref 80–100)
Platelet: 498 10*3/uL — ABNORMAL HIGH (ref 150–440)
RBC: 4.69 10*6/uL (ref 3.80–5.20)
WBC: 9.1 10*3/uL (ref 3.6–11.0)

## 2011-03-24 ENCOUNTER — Emergency Department: Payer: Self-pay | Admitting: Emergency Medicine

## 2011-07-09 ENCOUNTER — Ambulatory Visit: Payer: Medicare Other | Admitting: Family Medicine

## 2011-07-10 ENCOUNTER — Ambulatory Visit (INDEPENDENT_AMBULATORY_CARE_PROVIDER_SITE_OTHER): Payer: Medicare Other | Admitting: Family Medicine

## 2011-07-10 ENCOUNTER — Encounter: Payer: Self-pay | Admitting: Family Medicine

## 2011-07-10 VITALS — BP 124/70 | HR 64 | Temp 97.8°F | Wt 98.0 lb

## 2011-07-10 DIAGNOSIS — K5909 Other constipation: Secondary | ICD-10-CM

## 2011-07-10 NOTE — Progress Notes (Signed)
78 yo with h/o chronic constipation now living at the [facility] her to discuss constipation.     Long time user of cathartic laxatives.   The [facility] brings an incomplete medication list so we are not too sure what she is taking- according the staff member who brings her in today, she is receiving one Senna daily only but she will send [name] an updated med list.  Ms. Jezewski is concerned because often now having very runny stools, multiple in a day with senna, just one tablet. She frequently request immodium after she takes a senna!  No nausea or vomiting. No blood in stool. No fevers.   Patient Active Problem List  Diagnosis  . ANXIETY  . ALLERGIC RHINITIS  . WALKING PNEUMONIA  . CONSTIPATION, CHRONIC  . UTI  . VERTIGO  . LEG EDEMA, BILATERAL  . HOARSENESS  . PALPITATIONS  . DYSPHAGIA PHARYNGOESOPHAGEAL PHASE  . OTHER ABNORMAL BLOOD CHEMISTRY  . FEVER, HX OF  . Atypical chest pain  . Elevated blood-pressure reading without diagnosis of hypertension  . Fatigue  . Pneumonia  . Hypertension  . Weakness  . Sacral decubitus ulcer  . Lower extremity edema   Past Medical History  Diagnosis Date  . Edema leg   . Constipation   . LBBB (left bundle branch block)   . Allergic rhinitis   . Anxiety    Past Surgical History  Procedure Date  . Cholecystectomy 1960's  . Hemorrhoid surgery late 1950's  . Other surgery 1960's    hysterectomy   History  Substance Use Topics  . Smoking status: Former Games developer  . Smokeless tobacco: Not on file   Comment: very brief  . Alcohol Use: No   Family History  Problem Relation Age of Onset  . Cancer Mother     colon  . Heart disease Father     MI  . Cancer Maternal Aunt     lung  . Diabetes Neg Hx   . Hypertension Neg Hx   . Coronary artery disease Neg Hx    Allergies  Allergen Reactions  . Codeine Sulfate   . Lansoprazole   . Losartan Potassium-Hctz   . Penicillins   . Prednisone   . Sulfonamide Derivatives    Current  Outpatient Prescriptions on File Prior to Visit  Medication Sig Dispense Refill  . acetaminophen (TYLENOL) 650 MG CR tablet Two tablets by mouth two times a day as needed      . aspirin 81 MG tablet Take 81 mg by mouth daily.        . furosemide (LASIX) 10 MG/ML solution Take by mouth daily.      . Hydroactive Dressings (TEGADERM HYDROCOLLOID EX) Apply to sacrum every three days      . magnesium hydroxide (MILK OF MAGNESIA) 400 MG/5ML suspension Take 30cc daily as needed      . meloxicam (MOBIC) 7.5 MG tablet Take 7.5 mg by mouth daily.      . Nutritional Supplements (ENSURE ENLIVE) LIQD Take 1 Can by mouth 2 (two) times daily.      Marland Kitchen Phenylephrine-DM-GG (QC TUSSIN CF) 5-10-100 MG/5ML LIQD 15cc every six hours as needed for cough      . sennosides-docusate sodium (SENOKOT-S) 8.6-50 MG tablet Take 1 tablet by mouth daily. To prevent constipation          The PMH, PSH, Social History, Family History, Medications, and allergies have been reviewed in [facility], and have been updated if relevant.   Review  of Systems       See HPI General:  Denies fever, loss of appetite, and malaise. GI:  Complains of constipation; denies abdominal pain and bloody stools.  Physical Exam BP 124/70  Pulse 64  Temp 97.8 F (36.6 C)  Wt 98 lb (44.453 kg) General:  alert.  NAD Abdomen:  soft, non-tender, normal bowel sounds, and no masses.   Psych:  normal affect, talkative and pleasant   Assessment and Plan: 1. CONSTIPATION, CHRONIC    Unfortunately we do not have an updated med list.  Staff from [place] will fax updated copy. If all she is taking is one senna a day, this is a much milder bowel regimen than she has previously been taking. Since she is having loose stools with this, will change rx to senna every other day. The patient indicates understanding of these issues and agrees with the plan.

## 2011-07-11 ENCOUNTER — Telehealth: Payer: Self-pay | Admitting: *Deleted

## 2011-07-11 NOTE — Telephone Encounter (Signed)
Spoke with French Ana at [employer]  She said when pt came out of hospital last time lasix was no longer on her med list.  She doesn't have any information about who d/c/'d it or why.  Also, she wants you to be aware that pt is refusing her senna on a daily basis.

## 2011-07-12 NOTE — Telephone Encounter (Signed)
Please request last d/c summary from [facility].

## 2011-07-17 NOTE — Telephone Encounter (Signed)
Discharge summary is on your desk

## 2011-07-30 ENCOUNTER — Emergency Department: Payer: Self-pay | Admitting: *Deleted

## 2011-07-30 LAB — URINALYSIS, COMPLETE
Glucose,UR: NEGATIVE mg/dL (ref 0–75)
Nitrite: NEGATIVE
Ph: 5 (ref 4.5–8.0)
Protein: NEGATIVE
RBC,UR: <1 /HPF (ref 0–5)
Squamous Epithelial: NONE SEEN

## 2011-07-30 LAB — COMPREHENSIVE METABOLIC PANEL
Albumin: 2.9 g/dL — ABNORMAL LOW (ref 3.4–5.0)
Alkaline Phosphatase: 113 U/L (ref 50–136)
Calcium, Total: 8.8 mg/dL (ref 8.5–10.1)
Creatinine: 1.1 mg/dL (ref 0.60–1.30)
EGFR (Non-African Amer.): 44 — ABNORMAL LOW
Glucose: 114 mg/dL — ABNORMAL HIGH (ref 65–99)
Osmolality: 289 (ref 275–301)
Potassium: 3.8 mmol/L (ref 3.5–5.1)
SGPT (ALT): 14 U/L
Sodium: 142 mmol/L (ref 136–145)

## 2011-07-30 LAB — CBC WITH DIFFERENTIAL/PLATELET
Basophil #: 0.1 10*3/uL (ref 0.0–0.1)
Eosinophil #: 0.5 10*3/uL (ref 0.0–0.7)
MCHC: 32.1 g/dL (ref 32.0–36.0)
MCV: 92 fL (ref 80–100)
Monocyte %: 8.1 %
Neutrophil #: 3.9 10*3/uL (ref 1.4–6.5)
Neutrophil %: 47.2 %
Platelet: 436 10*3/uL (ref 150–440)
RBC: 3.67 10*6/uL — ABNORMAL LOW (ref 3.80–5.20)
RDW: 14.5 % (ref 11.5–14.5)
WBC: 8.2 10*3/uL (ref 3.6–11.0)

## 2011-07-30 LAB — CK TOTAL AND CKMB (NOT AT ARMC): CK, Total: 30 U/L (ref 21–215)

## 2011-08-03 ENCOUNTER — Encounter: Payer: Self-pay | Admitting: Internal Medicine

## 2011-08-09 ENCOUNTER — Encounter: Payer: Self-pay | Admitting: Internal Medicine

## 2011-09-09 ENCOUNTER — Encounter: Payer: Self-pay | Admitting: Internal Medicine

## 2011-09-14 ENCOUNTER — Telehealth: Payer: Self-pay

## 2011-09-14 NOTE — Telephone Encounter (Signed)
Ok to give verbal order.

## 2011-09-14 NOTE — Telephone Encounter (Signed)
Christine Vance with [facility]; pt being discharged from [facility] 09/17/11 and doctor there recommend skilled nursing, PT and OT when pt returns home to assisted living at [facility]. Wanted to get order from Dr Dayton Martes.Please advise.

## 2011-09-17 NOTE — Telephone Encounter (Signed)
Advised Julia.   

## 2011-10-03 DIAGNOSIS — R262 Difficulty in walking, not elsewhere classified: Secondary | ICD-10-CM

## 2011-10-03 DIAGNOSIS — Z96649 Presence of unspecified artificial hip joint: Secondary | ICD-10-CM

## 2011-10-03 DIAGNOSIS — Z471 Aftercare following joint replacement surgery: Secondary | ICD-10-CM

## 2011-10-29 ENCOUNTER — Other Ambulatory Visit: Payer: Self-pay | Admitting: *Deleted

## 2011-10-29 MED ORDER — ACETAMINOPHEN ER 650 MG PO TBCR: EXTENDED_RELEASE_TABLET | ORAL | Status: DC

## 2011-10-29 NOTE — Telephone Encounter (Signed)
Faxed refill request from [facility], last filled 07/03/11.

## 2011-11-26 ENCOUNTER — Telehealth: Payer: Self-pay

## 2011-11-26 NOTE — Telephone Encounter (Signed)
Form signed and in my box. 

## 2011-11-26 NOTE — Telephone Encounter (Signed)
Consuella Lose with [facility] faxed form to be signed for extension of PT orders.Please advise. Consuella Lose request form faxed back ASAP.Form is in Dr Elmer Sow in box.

## 2011-11-26 NOTE — Telephone Encounter (Signed)
Form faxed, copy sent for scanning. 

## 2012-01-07 ENCOUNTER — Encounter: Payer: Self-pay | Admitting: Family Medicine

## 2012-01-07 ENCOUNTER — Ambulatory Visit (INDEPENDENT_AMBULATORY_CARE_PROVIDER_SITE_OTHER): Payer: Medicare Other | Admitting: Family Medicine

## 2012-01-07 VITALS — BP 140/76 | HR 100 | Temp 97.8°F | Wt 104.0 lb

## 2012-01-07 DIAGNOSIS — K5909 Other constipation: Secondary | ICD-10-CM

## 2012-01-07 NOTE — Progress Notes (Signed)
78 yo with h/o chronic constipation now living at the [facility] her to discuss constipation.    Long time user of cathartic laxatives.  The [facility] called asking for order for something for constipation last week. Dr. Alphonsus Sias sent order for miralax and MOM prn.  She feels bowels are working much better.  No one is with her today from the [place] so unclear if she was ever given the MOM.  No nausea or vomiting. No blood in stool. No fevers.   Patient Active Problem List  Diagnosis  . ANXIETY  . ALLERGIC RHINITIS  . WALKING PNEUMONIA  . CONSTIPATION, CHRONIC  . UTI  . VERTIGO  . LEG EDEMA, BILATERAL  . HOARSENESS  . PALPITATIONS  . DYSPHAGIA PHARYNGOESOPHAGEAL PHASE  . OTHER ABNORMAL BLOOD CHEMISTRY  . FEVER, HX OF  . Atypical chest pain  . Elevated blood-pressure reading without diagnosis of hypertension  . Fatigue  . Pneumonia  . Hypertension  . Weakness  . Sacral decubitus ulcer  . Lower extremity edema   Past Medical History  Diagnosis Date  . Edema leg   . Constipation   . LBBB (left bundle branch block)   . Allergic rhinitis   . Anxiety    Past Surgical History  Procedure Date  . Cholecystectomy 1960's  . Hemorrhoid surgery late 1950's  . Other surgery 1960's    hysterectomy   History  Substance Use Topics  . Smoking status: Former Games developer  . Smokeless tobacco: Not on file     Comment: very brief  . Alcohol Use: No   Family History  Problem Relation Age of Onset  . Cancer Mother     colon  . Heart disease Father     MI  . Cancer Maternal Aunt     lung  . Diabetes Neg Hx   . Hypertension Neg Hx   . Coronary artery disease Neg Hx    Allergies  Allergen Reactions  . Codeine Sulfate   . Lansoprazole   . Losartan Potassium-Hctz   . Penicillins   . Prednisone   . Sulfonamide Derivatives    Current Outpatient Prescriptions on File Prior to Visit  Medication Sig Dispense Refill  . acetaminophen (TYLENOL) 650 MG CR tablet Two tablets by mouth four times  a day as needed  56 tablet  0  . aspirin 81 MG tablet Take 81 mg by mouth daily.        . furosemide (LASIX) 10 MG/ML solution Take by mouth daily.      Marland Kitchen HYDROcodone-acetaminophen (NORCO) 5-325 MG per tablet Take 1 tablet by mouth every 6 (six) hours as needed.      . meloxicam (MOBIC) 7.5 MG tablet Take 7.5 mg by mouth daily.      . Nutritional Supplements (ENSURE ENLIVE) LIQD Take 1 Can by mouth 2 (two) times daily.      Marland Kitchen Phenylephrine-DM-GG (QC TUSSIN CF) 5-10-100 MG/5ML LIQD 15cc every six hours as needed for cough      . sennosides-docusate sodium (SENOKOT-S) 8.6-50 MG tablet Take 1 tablet by mouth daily. To prevent constipation          The PMH, PSH, Social History, Family History, Medications, and allergies have been reviewed in [facility], and have been updated if relevant.   Review of Systems       See HPI General:  Denies fever, loss of appetite, and malaise. GI:  Complains of constipation; denies abdominal pain and bloody stools.  Physical Exam BP 140/76  Pulse 100  Temp 97.8 F (36.6 C)  Wt 104 lb (47.174 kg) General:  alert.  NAD Abdomen:  soft, non-tender, normal bowel sounds, and no masses.   Psych:  normal affect, talkative and pleasant   Assessment and Plan: 1. CONSTIPATION, CHRONIC   Improved with miralax. Will continue.  Will check CMET.

## 2012-01-08 ENCOUNTER — Encounter: Payer: Self-pay | Admitting: Family Medicine

## 2012-01-25 ENCOUNTER — Telehealth: Payer: Self-pay | Admitting: Family Medicine

## 2012-01-25 NOTE — Telephone Encounter (Signed)
Christine Vance, they cant accept a verbal order, written order must be faxed to [phone].

## 2012-01-25 NOTE — Telephone Encounter (Signed)
Rx written and on my desk. 

## 2012-01-25 NOTE — Telephone Encounter (Signed)
French Ana with [facility] [place] called.  Pt seen here on 01/07/12.  We gave orders for a CMET to be drawn for the patient.  They cannot find that order and need a new one.  Please call French Ana at [phone]

## 2012-01-25 NOTE — Telephone Encounter (Signed)
Order faxed.

## 2012-02-26 ENCOUNTER — Encounter: Payer: Self-pay | Admitting: Family Medicine

## 2012-02-28 ENCOUNTER — Telehealth: Payer: Self-pay | Admitting: Internal Medicine

## 2012-02-28 MED ORDER — ALPRAZOLAM 0.25 MG PO TABS: ORAL_TABLET | ORAL | Status: DC

## 2012-02-28 NOTE — Telephone Encounter (Signed)
Received call from [facility] nurse staff.  Patient's son recently died.  Patient reported to suffer from anxiety and severe grief reaction.   They request prescription for ativan.  I suggest she try low dose alprazolam 0.25 mg 1/2 to one tablet q 8 hrs as needed.  #30 No refills.   Prescription called to [facility] [phone] Patient advised to follow up with her PCP within 1 week.

## 2012-02-29 ENCOUNTER — Telehealth: Payer: Self-pay | Admitting: Family Medicine

## 2012-02-29 NOTE — Telephone Encounter (Signed)
I'm sorry to hear that.  Thank you for scheduling a follow up appt for her.

## 2012-02-29 NOTE — Telephone Encounter (Signed)
[facility] Triage Call Report Triage Record Num: [id number] Operator: Kelle Darting Patient Name: Christine Vance Call Date & Time: 02/28/2012 7:19:28PM Patient Phone: [phone] PCP: Ruthe Mannan Patient Gender: Female PCP Fax : [phone] Patient DOB: 1920-05-07 Practice Name: [facility] Reason for Call: Caller: Lora/Director at the [employer]; PCP: Ruthe Mannan (Family Practice); CB#: [phone]; Call regarding Needing Dr. Artist Pais to call the pharmacy in order to fill the Xanex order that he had previously ordered.; Note: Patients son died, facility had received an order for [facility] for patient from Dr. Artist Pais, Pharmacy is requiring Dr. Artist Pais to call them personally to give them the order; Spoke with Dr. Artist Pais and informed of same, states he will call [facility] regarding the [facility] Rx. Note to office. Protocol(s) Used: Office Note Recommended Outcome per Protocol: Information Noted and Sent to Office Reason for Outcome: Caller information to office Care Advice: ~ 02/28/2012 7:23:27PM Page 1 of 1 [id number]

## 2012-02-29 NOTE — Telephone Encounter (Signed)
Noted.  Thank you.  Jacki Cones, please call to check on her and to give her my condolences.

## 2012-02-29 NOTE — Telephone Encounter (Signed)
Spoke with Mervyn Gay at the [place], she says patient is very distraught today.  Appointment made for next Thursday.

## 2012-03-06 ENCOUNTER — Ambulatory Visit: Payer: Medicare Other | Admitting: Family Medicine

## 2012-03-06 DIAGNOSIS — Z0289 Encounter for other administrative examinations: Secondary | ICD-10-CM

## 2012-03-09 ENCOUNTER — Emergency Department: Payer: Self-pay | Admitting: Internal Medicine

## 2012-04-09 ENCOUNTER — Telehealth: Payer: Self-pay | Admitting: *Deleted

## 2012-04-09 NOTE — Telephone Encounter (Signed)
The [place] [place] dropped off orders to be signed by Dr. Dayton Martes.\  Please call when ready for pickup [phone].

## 2012-04-09 NOTE — Telephone Encounter (Signed)
Forms are on your desk.

## 2012-04-10 NOTE — Telephone Encounter (Signed)
Forms ready for pick up, advised facility.

## 2012-04-10 NOTE — Telephone Encounter (Signed)
Signed in my box

## 2012-07-20 ENCOUNTER — Emergency Department: Payer: Self-pay | Admitting: Emergency Medicine

## 2012-07-20 LAB — URINALYSIS, COMPLETE
Glucose,UR: NEGATIVE mg/dL (ref 0–75)
Ph: 6 (ref 4.5–8.0)
Specific Gravity: 1.017 (ref 1.003–1.030)

## 2012-10-29 ENCOUNTER — Emergency Department: Payer: Self-pay | Admitting: Emergency Medicine

## 2012-11-21 ENCOUNTER — Telehealth: Payer: Self-pay | Admitting: Family Medicine

## 2012-11-21 NOTE — Telephone Encounter (Signed)
Shanda Bumps from the [place] [facility] [place] called requesting that [facility] that was sent to our office on 11/16/12 be signed and faxed back to [phone] (paperwork is most likely in Dr. Elmer Sow inbox).  Cb [id number].

## 2012-11-21 NOTE — Telephone Encounter (Signed)
i just went through her inbox and didn't find it. Did it get handed out to one of the other providers?

## 2012-11-21 NOTE — Telephone Encounter (Signed)
Spoke with Shanda Bumps.  She will fax [facility] over again attn: Carlena Sax and I will get signature from Nicki Reaper, NP.

## 2013-01-15 ENCOUNTER — Telehealth: Payer: Self-pay

## 2013-01-15 NOTE — Telephone Encounter (Signed)
I think it would be best for her to be seen in the office tommorow because it may be flu but it could be a bacterial pneumonia which tamiflu would not cover. If worse over night, ER.
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
## 2013-01-19 NOTE — Telephone Encounter (Signed)
Left message on voicemail.

## 2013-01-23 ENCOUNTER — Telehealth: Payer: Self-pay | Admitting: Family Medicine

## 2013-01-23 NOTE — Telephone Encounter (Signed)
[facility] [place] dropped of FL-2 form for Con-wayLorraine Amspacher for Dr. Dayton MartesAron to complete. Please mail form when completed. Envelope with stamp attached.

## 2013-01-23 NOTE — Telephone Encounter (Signed)
Form completed and signed by Dr Dayton MartesAron. Copy made for scanning, and placed in outgoing mail

## 2013-04-22 ENCOUNTER — Telehealth: Payer: Self-pay | Admitting: Family Medicine

## 2013-04-22 NOTE — Telephone Encounter (Signed)
Form placed in Dr Elmer SowAron's inbox for approval/denial.

## 2013-04-22 NOTE — Telephone Encounter (Signed)
Form dropped off to be signed by Dr Dayton MartesAron. Please call when form is ready to be picked up. Form placed on Waynetta's desk to give to Dr Dayton MartesAron. Thank you!

## 2013-04-23 NOTE — Telephone Encounter (Signed)
Signed and in my box. 

## 2013-06-10 ENCOUNTER — Encounter: Payer: Self-pay | Admitting: Podiatry

## 2013-06-19 ENCOUNTER — Ambulatory Visit: Payer: Self-pay | Admitting: Podiatry

## 2013-06-25 ENCOUNTER — Telehealth: Payer: Self-pay | Admitting: Family Medicine

## 2013-06-25 DIAGNOSIS — F32A Depression, unspecified: Secondary | ICD-10-CM

## 2013-06-25 DIAGNOSIS — F329 Major depressive disorder, single episode, unspecified: Secondary | ICD-10-CM

## 2013-06-25 NOTE — Telephone Encounter (Signed)
Received request from the [facility] for psych referral for [place] any [place]. I see pt has h/o anxiety in our chart. plz call the [facility] to speak with Carlyle BasquesKristi Garrison for more information on need/reason for referral. [phone]. Will place in Dr. Elmer SowAron's in box while we obtain more information.

## 2013-06-26 ENCOUNTER — Ambulatory Visit: Payer: Self-pay | Admitting: Podiatry

## 2013-06-26 NOTE — Telephone Encounter (Signed)
Spoke with Silva BandyKristi about patient. She said patient will just start crying for no reason. The patient can't give a reason and the staff can't find a reason. They will sit with her and talk with her and eventually she will stop, but it is happening daily on a frequent basis. Silva BandyKristi said it is fine to wait for the referral until Monday since both Dr. Dayton MartesAron and Dr. Reece AgarG are out of the office. Request left in Dr. Elmer SowAron's IN box and note forwarded to her and Dr.G for FYI.

## 2013-06-26 NOTE — Telephone Encounter (Signed)
Called the [facility] and they said, All Dr Dayton MartesAron has to do is sign the order for Psych referral and fax it back to them. I got the order from Dr Roberts GaudyA's in box , stamped her sig on form and  faxed it back to them. Fax went thru and they will proceed with the referral.

## 2013-06-26 NOTE — Telephone Encounter (Signed)
Thank you. I will place referral

## 2013-06-26 NOTE — Telephone Encounter (Signed)
Noted! Thank you

## 2013-07-07 ENCOUNTER — Telehealth: Payer: Self-pay | Admitting: *Deleted

## 2013-07-07 NOTE — Telephone Encounter (Signed)
Noted! Thank you

## 2013-07-07 NOTE — Telephone Encounter (Signed)
Fax received from [facility] [place] indicating that the pt is not sleeping well and has been up with anxiety. Per Dr Dayton MartesAron , pts is to be scheduled for 30min OV to discuss. Contacted pts daughter Cordelia PenSherry, who states that she was unaware of notification. She states that the pt has always had trouble sleeping, but has never had anxiety. Cordelia PenSherry states that she will find out more information and will contact the office back to schedule appt

## 2013-07-09 NOTE — Telephone Encounter (Signed)
Second request received on today from [facility] [place] with same complaint of pt not being able to sleep and wanting Rx to assist with sleeping and anxiety. No record that Cordelia PenSherry, pts daughter had contacted office regarding original request. Cordelia PenSherry called and indicates that she has spoken with the director of [facility], and they are unaware of who is sending this request. Cordelia PenSherry states that she will again contact [facility] to "get to the bottom of this," and is not wanting action until she finds out who is making the request. A copy of request was [facility] to [facility], at her request, to [phone].

## 2013-09-23 ENCOUNTER — Telehealth: Payer: Self-pay

## 2013-09-23 NOTE — Telephone Encounter (Signed)
Sheree Lukasiewicz left v/m; pt stays at the [facility]; Sheree wants to know if Dr Dayton Martes prescribed Calcitrate plus vitamins; Sheree said pt already taking Vit D 2000 units a day and drinks 2 ensure daily. Pt wants to know if was ordered and if so why was med ordered. Sheree request cb.

## 2013-09-23 NOTE — Telephone Encounter (Signed)
No I did not order it. She does not need to take it.

## 2013-09-23 NOTE — Telephone Encounter (Signed)
Spoke to [facility] and advised per Dr Dayton Martes. Pts daughter verbally expressed understanding.

## 2013-10-08 ENCOUNTER — Telehealth: Payer: Self-pay

## 2013-10-08 NOTE — Telephone Encounter (Signed)
Called patient//lmovm advising physical need for 2015 per Medicare.

## 2013-10-14 ENCOUNTER — Inpatient Hospital Stay: Payer: Self-pay | Admitting: Internal Medicine

## 2013-10-14 ENCOUNTER — Telehealth: Payer: Self-pay

## 2013-10-14 LAB — URINALYSIS, COMPLETE
BILIRUBIN, UR: NEGATIVE
BLOOD: NEGATIVE
Glucose,UR: NEGATIVE mg/dL (ref 0–75)
KETONE: NEGATIVE
Nitrite: NEGATIVE
PH: 6 (ref 4.5–8.0)
Protein: NEGATIVE
RBC,UR: 9 /HPF (ref 0–5)
SPECIFIC GRAVITY: 1.015 (ref 1.003–1.030)
SQUAMOUS EPITHELIAL: NONE SEEN
WBC UR: 408 /HPF (ref 0–5)

## 2013-10-14 LAB — CBC
HCT: 37.8 % (ref 35.0–47.0)
HGB: 12 g/dL (ref 12.0–16.0)
MCH: 29.5 pg (ref 26.0–34.0)
MCHC: 31.7 g/dL — AB (ref 32.0–36.0)
MCV: 93 fL (ref 80–100)
Platelet: 498 10*3/uL — ABNORMAL HIGH (ref 150–440)
RBC: 4.07 10*6/uL (ref 3.80–5.20)
RDW: 13.3 % (ref 11.5–14.5)
WBC: 9.7 10*3/uL (ref 3.6–11.0)

## 2013-10-14 LAB — COMPREHENSIVE METABOLIC PANEL
ALK PHOS: 102 U/L
ALT: 19 U/L
ANION GAP: 4 — AB (ref 7–16)
AST: 21 U/L (ref 15–37)
Albumin: 3.3 g/dL — ABNORMAL LOW (ref 3.4–5.0)
BILIRUBIN TOTAL: 1.2 mg/dL — AB (ref 0.2–1.0)
BUN: 23 mg/dL — ABNORMAL HIGH (ref 7–18)
CALCIUM: 9.2 mg/dL (ref 8.5–10.1)
CHLORIDE: 109 mmol/L — AB (ref 98–107)
Co2: 29 mmol/L (ref 21–32)
Creatinine: 1.26 mg/dL (ref 0.60–1.30)
EGFR (African American): 51 — ABNORMAL LOW
GFR CALC NON AF AMER: 42 — AB
GLUCOSE: 116 mg/dL — AB (ref 65–99)
Osmolality: 288 (ref 275–301)
Potassium: 4 mmol/L (ref 3.5–5.1)
Sodium: 142 mmol/L (ref 136–145)
TOTAL PROTEIN: 6.9 g/dL (ref 6.4–8.2)

## 2013-10-14 LAB — HEMOGLOBIN A1C: Hemoglobin A1C: 6.6 % — ABNORMAL HIGH (ref 4.2–6.3)

## 2013-10-14 LAB — TSH: THYROID STIMULATING HORM: 1.88 u[IU]/mL

## 2013-10-14 LAB — TROPONIN I

## 2013-10-14 NOTE — Telephone Encounter (Signed)
Spoke to HartmanLakisha and advised per Dr Dayton MartesAron. She verbally expressed understanding and states that she will relay to pts POA

## 2013-10-14 NOTE — Telephone Encounter (Signed)
I did not receive a note about this.  Did she fall?  Are we xraying her shoulder and arm? She may need to go to ER if she did have a fall.

## 2013-10-14 NOTE — Telephone Encounter (Signed)
Given her age and inability to move her arm, I would suggest going to ER to be evaluated. She may need immediate ortho attention.

## 2013-10-14 NOTE — Telephone Encounter (Signed)
Earnie LarssonLakisha Crisp with [facility] left v/m requesting status of fax sent on 10/13/13 for xray order of right arm.Please advise.
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
## 2013-10-15 LAB — CBC WITH DIFFERENTIAL/PLATELET
BASOS ABS: 0.1 10*3/uL (ref 0.0–0.1)
BASOS PCT: 1 %
EOS ABS: 0.4 10*3/uL (ref 0.0–0.7)
EOS PCT: 5.6 %
HCT: 36.2 % (ref 35.0–47.0)
HGB: 11.6 g/dL — AB (ref 12.0–16.0)
LYMPHS PCT: 22.8 %
Lymphocyte #: 1.6 10*3/uL (ref 1.0–3.6)
MCH: 29.4 pg (ref 26.0–34.0)
MCHC: 31.9 g/dL — ABNORMAL LOW (ref 32.0–36.0)
MCV: 92 fL (ref 80–100)
MONOS PCT: 7.7 %
Monocyte #: 0.5 x10 3/mm (ref 0.2–0.9)
NEUTROS ABS: 4.5 10*3/uL (ref 1.4–6.5)
NEUTROS PCT: 62.9 %
Platelet: 479 10*3/uL — ABNORMAL HIGH (ref 150–440)
RBC: 3.93 10*6/uL (ref 3.80–5.20)
RDW: 12.9 % (ref 11.5–14.5)
WBC: 7.2 10*3/uL (ref 3.6–11.0)

## 2013-10-15 LAB — BASIC METABOLIC PANEL
ANION GAP: 10 (ref 7–16)
BUN: 24 mg/dL — AB (ref 7–18)
CALCIUM: 9 mg/dL (ref 8.5–10.1)
CHLORIDE: 106 mmol/L (ref 98–107)
CO2: 26 mmol/L (ref 21–32)
Creatinine: 1.21 mg/dL (ref 0.60–1.30)
EGFR (African American): 54 — ABNORMAL LOW
GFR CALC NON AF AMER: 44 — AB
Glucose: 109 mg/dL — ABNORMAL HIGH (ref 65–99)
Osmolality: 288 (ref 275–301)
POTASSIUM: 3.8 mmol/L (ref 3.5–5.1)
Sodium: 142 mmol/L (ref 136–145)

## 2013-10-15 LAB — LIPID PANEL
Cholesterol: 139 mg/dL (ref 0–200)
HDL Cholesterol: 53 mg/dL (ref 40–60)
Ldl Cholesterol, Calc: 71 mg/dL (ref 0–100)
Triglycerides: 73 mg/dL (ref 0–200)
VLDL Cholesterol, Calc: 15 mg/dL (ref 5–40)

## 2013-10-16 LAB — URINE CULTURE

## 2014-01-22 ENCOUNTER — Telehealth (INDEPENDENT_AMBULATORY_CARE_PROVIDER_SITE_OTHER): Payer: Self-pay

## 2014-01-22 DIAGNOSIS — R8290 Unspecified abnormal findings in urine: Secondary | ICD-10-CM

## 2014-01-22 NOTE — Telephone Encounter (Signed)
Ok to fax order for urinalysis

## 2014-01-22 NOTE — Telephone Encounter (Signed)
Christine Vance with [facility] left v/m; for last 2 days pt is more confused than usual. Urine has foul odor; no complaints of symptoms from pt. Needs order for U/A faxed to [phone].Please advise. Pt last seen 07/10/2011.

## 2014-01-25 ENCOUNTER — Telehealth: Payer: Self-pay | Admitting: Family Medicine

## 2014-01-25 DIAGNOSIS — Z7689 Persons encountering health services in other specified circumstances: Secondary | ICD-10-CM | POA: Diagnosis not present

## 2014-01-25 LAB — POCT URINALYSIS DIPSTICK

## 2014-01-25 NOTE — Telephone Encounter (Signed)
Order faxed as directed. 

## 2014-01-25 NOTE — Telephone Encounter (Signed)
On your desk for signature

## 2014-01-25 NOTE — Telephone Encounter (Signed)
Rokkell with [facility] [place] dropped of FL2, Care Plan, Diet Order, Standing order and Medication orders for Dr Dayton MartesAron to fill out and approve. Will put inbox. Please call when ready.

## 2014-01-26 DIAGNOSIS — Z7689 Persons encountering health services in other specified circumstances: Secondary | ICD-10-CM

## 2014-02-03 ENCOUNTER — Telehealth: Payer: Self-pay

## 2014-02-03 NOTE — Telephone Encounter (Signed)
Christine Vance with [employer] billing left v/m wanting to know if pt is considered skilled on non skilled; use ref # [id number].Please advise.

## 2014-02-10 NOTE — Telephone Encounter (Signed)
Returned [employer] Companies[employer]atesha's call. She wanted insurance info and is going to fax authorization to release the info.

## 2014-02-11 ENCOUNTER — Encounter: Payer: Self-pay | Admitting: Family Medicine

## 2014-02-11 ENCOUNTER — Telehealth: Payer: Self-pay | Admitting: *Deleted

## 2014-02-11 ENCOUNTER — Emergency Department: Payer: Self-pay | Admitting: Student

## 2014-02-11 ENCOUNTER — Ambulatory Visit (INDEPENDENT_AMBULATORY_CARE_PROVIDER_SITE_OTHER): Payer: Medicare Other | Admitting: Family Medicine

## 2014-02-11 VITALS — BP 122/72 | HR 63 | Temp 98.0°F | Ht <= 58 in | Wt 105.8 lb

## 2014-02-11 DIAGNOSIS — T148 Other injury of unspecified body region: Secondary | ICD-10-CM

## 2014-02-11 DIAGNOSIS — M810 Age-related osteoporosis without current pathological fracture: Secondary | ICD-10-CM | POA: Diagnosis not present

## 2014-02-11 DIAGNOSIS — F0390 Unspecified dementia without behavioral disturbance: Secondary | ICD-10-CM

## 2014-02-11 DIAGNOSIS — S0990XA Unspecified injury of head, initial encounter: Secondary | ICD-10-CM | POA: Diagnosis not present

## 2014-02-11 DIAGNOSIS — S0003XA Contusion of scalp, initial encounter: Secondary | ICD-10-CM | POA: Diagnosis not present

## 2014-02-11 DIAGNOSIS — R531 Weakness: Secondary | ICD-10-CM | POA: Diagnosis not present

## 2014-02-11 DIAGNOSIS — L750 Bromhidrosis: Secondary | ICD-10-CM

## 2014-02-11 DIAGNOSIS — Z880 Allergy status to penicillin: Secondary | ICD-10-CM | POA: Diagnosis not present

## 2014-02-11 DIAGNOSIS — F32A Depression, unspecified: Secondary | ICD-10-CM

## 2014-02-11 DIAGNOSIS — W19XXXA Unspecified fall, initial encounter: Secondary | ICD-10-CM | POA: Diagnosis not present

## 2014-02-11 DIAGNOSIS — Z0000 Encounter for general adult medical examination without abnormal findings: Secondary | ICD-10-CM

## 2014-02-11 DIAGNOSIS — IMO0002 Reserved for concepts with insufficient information to code with codable children: Secondary | ICD-10-CM | POA: Insufficient documentation

## 2014-02-11 DIAGNOSIS — F329 Major depressive disorder, single episode, unspecified: Secondary | ICD-10-CM

## 2014-02-11 DIAGNOSIS — I10 Essential (primary) hypertension: Secondary | ICD-10-CM

## 2014-02-11 DIAGNOSIS — K5909 Other constipation: Secondary | ICD-10-CM

## 2014-02-11 DIAGNOSIS — R300 Dysuria: Secondary | ICD-10-CM

## 2014-02-11 DIAGNOSIS — S199XXA Unspecified injury of neck, initial encounter: Secondary | ICD-10-CM | POA: Diagnosis not present

## 2014-02-11 DIAGNOSIS — E119 Type 2 diabetes mellitus without complications: Secondary | ICD-10-CM | POA: Diagnosis not present

## 2014-02-11 DIAGNOSIS — L749 Eccrine sweat disorder, unspecified: Secondary | ICD-10-CM | POA: Diagnosis not present

## 2014-02-11 DIAGNOSIS — R401 Stupor: Secondary | ICD-10-CM | POA: Diagnosis not present

## 2014-02-11 LAB — COMPREHENSIVE METABOLIC PANEL
ALBUMIN: 3.9 g/dL (ref 3.5–5.2)
ALK PHOS: 103 U/L (ref 39–117)
ALT: 11 U/L (ref 0–35)
AST: 19 U/L (ref 0–37)
BILIRUBIN TOTAL: 0.5 mg/dL (ref 0.2–1.2)
BUN: 31 mg/dL — ABNORMAL HIGH (ref 6–23)
CHLORIDE: 103 meq/L (ref 96–112)
CO2: 28 meq/L (ref 19–32)
Calcium: 9.9 mg/dL (ref 8.4–10.5)
Creatinine, Ser: 1.23 mg/dL — ABNORMAL HIGH (ref 0.40–1.20)
GFR: 43.44 mL/min — ABNORMAL LOW (ref >60.00)
GLUCOSE: 86 mg/dL (ref 70–99)
Potassium: 4.3 mEq/L (ref 3.5–5.1)
SODIUM: 138 meq/L (ref 135–145)
TOTAL PROTEIN: 6.8 g/dL (ref 6.0–8.3)

## 2014-02-11 LAB — CBC WITH DIFFERENTIAL/PLATELET
BASOS PCT: 0.8 % (ref 0.0–3.0)
Basophils Absolute: 0.1 10*3/uL (ref 0.0–0.1)
Eosinophils Absolute: 0.4 10*3/uL (ref 0.0–0.7)
Eosinophils Relative: 4.8 % (ref 0.0–5.0)
HEMATOCRIT: 36.6 % (ref 36.0–46.0)
HEMOGLOBIN: 12.1 g/dL (ref 12.0–15.0)
LYMPHS ABS: 3.6 10*3/uL (ref 0.7–4.0)
LYMPHS PCT: 39.3 % (ref 12.0–46.0)
MCHC: 33.1 g/dL (ref 30.0–36.0)
MCV: 88.8 fl (ref 78.0–100.0)
MONO ABS: 0.7 10*3/uL (ref 0.1–1.0)
Monocytes Relative: 7.5 % (ref 3.0–12.0)
Neutro Abs: 4.3 10*3/uL (ref 1.4–7.7)
Neutrophils Relative %: 47.6 % (ref 43.0–77.0)
PLATELETS: 568 10*3/uL — AB (ref 150.0–400.0)
RBC: 4.13 Mil/uL (ref 3.87–5.11)
RDW: 13.7 % (ref 11.5–15.5)
WBC: 9.1 10*3/uL (ref 4.0–10.5)

## 2014-02-11 LAB — TSH: TSH: 2.78 u[IU]/mL (ref 0.35–4.50)

## 2014-02-11 LAB — VITAMIN D 25 HYDROXY (VIT D DEFICIENCY, FRACTURES): VITD: 49.66 ng/mL (ref 30.00–100.00)

## 2014-02-11 LAB — LIPID PANEL
CHOLESTEROL: 164 mg/dL (ref 0–200)
HDL: 54 mg/dL (ref >39.00)
LDL CALC: 80 mg/dL (ref 0–99)
NonHDL: 110
Total CHOL/HDL Ratio: 3
Triglycerides: 149 mg/dL (ref 0.0–149.0)
VLDL: 29.8 mg/dL (ref 0.0–40.0)

## 2014-02-11 NOTE — Telephone Encounter (Signed)
Yes ok to send.  

## 2014-02-11 NOTE — Assessment & Plan Note (Signed)
Seems well controlled on mirlax.

## 2014-02-11 NOTE — Assessment & Plan Note (Signed)
Mild without rx. ALF appropriate based on exam from today.

## 2014-02-11 NOTE — Telephone Encounter (Signed)
I ordered urinalysis with micro,  reflex to urine cx, and will send specimen to lab.

## 2014-02-11 NOTE — Assessment & Plan Note (Signed)
Persistent pain. Will schedule tylenol, continue prn order as well.  Likely needs DEXA- will advise new MD order this.

## 2014-02-11 NOTE — Assessment & Plan Note (Signed)
The patients weight, height, BMI and visual acuity have been recorded in the chart I have made referrals, counseling and provided education to the patient based review of the above and I have provided the pt with a written personalized care plan for preventive services.  Advised her grand daughter that she should establish care with MD at ALF - transfer all care there- since it is dangerous to have too many providers and I do not make rounds at that facility.  I have not seen her since 2013 and should therefore not be here PCP.  Will check labs today and route to ALF.

## 2014-02-11 NOTE — Progress Notes (Signed)
Pre visit review using our clinic review tool, if applicable. No additional management support is needed unless otherwise documented below in the visit note. 

## 2014-02-11 NOTE — Telephone Encounter (Signed)
Pt was seen today, and came over for labs. Her grand daughter mentioned that she was also supposed to give a urine. She has been having burning with urination x 2 weeks, and her urine has a strong odor. Grand daughter said she doesn't think it's a yeast infection because she hasn't had any vag discharge, and she eats a lot of yogurt. Pt denies any blood in urine, or dark colored urine, urinary frequency, and grand daughter denies any mental changes. I had pt give a urine specimen. Is it ok to send it for urinalysis?

## 2014-02-11 NOTE — Progress Notes (Signed)
Subjective:   Patient ID: Christine Vance, female    DOB: 1920/11/25, 81 y.o.   MRN: [id number]  Christine Vance is a pleasant 81 y.o. year old female who presents to clinic today with her grand daughter for Annual Exam  on 02/11/2014  HPI:  I have personally reviewed the Medicare Annual Wellness questionnaire and have noted 1. The patient's medical and social history 2. Their use of alcohol, tobacco or illicit drugs 3. Their current medications and supplements 4. The patient's functional ability including ADL's, fall risks, home safety risks and hearing or visual             impairment. 5. Diet and physical activities 6. Evidence for depression or mood disorders  End of life wishes discussed and updated in Social History.  The roster of all physicians providing medical care to patient - is listed in the Snapshot section of the chart.  Influenza vaccine and pneumovax per her grand daughter- 08/2013  I have not seen her for routine care in 3 years.  Her grand daughter is not sure exactly who has been managing her rx- ?if she has been seeing an MD at the [facility]?  Has been living at [facility] [place].  Chronic constipation- per grand daughter, has been controlled on Miralax.  Feels her depression has been controlled on low dose zoloft.  Appetite ok-  Wt Readings from Last 3 Encounters:  02/11/14 105 lb 12 oz (47.968 kg)  01/07/12 104 lb (47.174 kg)  07/10/11 98 lb (44.453 kg)   Compression fx since I last saw her- takes tramadol and tylenol prn pain.  Does not like to take tramadol as she worries it will cause constipation and not sure how often she asks for tylenol. She is having back and leg pain today.  No results found for: CHOL, HDL, LDLCALC, LDLDIRECT, TRIG, CHOLHDL Lab Results  Component Value Date   CREATININE 1.0 02/13/2011   Current Outpatient Prescriptions on File Prior to Visit  Medication Sig Dispense Refill  . acetaminophen (TYLENOL) 650 MG CR tablet Two  tablets by mouth four times a day as needed 56 tablet 0  . aspirin 81 MG tablet Take 81 mg by mouth daily.      . polyethylene glycol powder (MIRALAX) powder Take 17 g by mouth daily.     No current facility-administered medications on file prior to visit.    Allergies  Allergen Reactions  . Codeine Sulfate   . Lansoprazole   . Losartan Potassium-Hctz   . Penicillins   . Prednisone   . Sulfonamide Derivatives     Past Medical History  Diagnosis Date  . Edema leg   . Constipation   . LBBB (left bundle branch block)   . Allergic rhinitis   . Anxiety     Past Surgical History  Procedure Laterality Date  . Cholecystectomy  1960's  . Hemorrhoid surgery  late 1950's  . Other surgery  1960's    hysterectomy    Family History  Problem Relation Age of Onset  . Cancer Mother     colon  . Heart disease Father     MI  . Cancer Maternal Aunt     lung  . Diabetes Neg Hx   . Hypertension Neg Hx   . Coronary artery disease Neg Hx     History   Social History  . Marital Status: Widowed    Spouse Name: N/A    Number of Children: 2  .  Years of Education: N/A   Occupational History  . hosiery worker     retired   Social History Main Topics  . Smoking status: Former Games developermoker  . Smokeless tobacco: Not on file     Comment: very brief  . Alcohol Use: No  . Drug Use: Not on file  . Sexual Activity: Not on file   Other Topics Concern  . Not on file   Social History Narrative   Living [facility]   Has 2 half brothers, one half sister.   Does not have a living will.   Grand daughter, Roanna RaiderSherri Heinle is HPOA   The PMH, PSH, Social History, Family History, Medications, and allergies have been reviewed in [facility], and have been updated if relevant.   Review of Systems  Constitutional: Negative for appetite change and unexpected weight change.  HENT: Negative.   Respiratory: Negative.   Cardiovascular: Negative.   Gastrointestinal: Positive for constipation.    Genitourinary: Negative.   Musculoskeletal: Positive for back pain and arthralgias.  Skin: Negative.   Neurological: Negative.   Hematological: Negative.   Psychiatric/Behavioral: Negative.        Objective:    BP 122/72 mmHg  Pulse 63  Temp(Src) 98 F (36.7 C) (Oral)  Ht 4\' 7"  (1.397 m)  Wt 105 lb 12 oz (47.968 kg)  BMI 24.58 kg/m2  SpO2 96%   Physical Exam  Constitutional: She is oriented to person, place, and time.  Elderly woman, wheelchair bound, kyphotic and leaning to right side  HENT:  Head: Normocephalic.  Eyes: Conjunctivae are normal.  Cardiovascular: Normal rate.   Pulmonary/Chest: Effort normal.  Abdominal: Soft. Bowel sounds are normal.  Musculoskeletal: She exhibits no edema.  Neurological: She is alert and oriented to person, place, and time. No cranial nerve deficit.  Skin: Skin is warm.  Nursing note and vitals reviewed.         Assessment & Plan:   Medicare annual wellness visit, initial - Plan: CBC with Differential/Platelet, Comprehensive metabolic panel, Lipid panel, TSH, Vitamin D, 25-hydroxy  Osteoporosis  Dementia, without behavioral disturbance  CONSTIPATION, CHRONIC  Depression  Compression fracture No Follow-up on file.

## 2014-02-11 NOTE — Assessment & Plan Note (Signed)
Stable on current dose of zoloft. No changes made.

## 2014-02-11 NOTE — Patient Instructions (Signed)
Good to see you.  Check with your insurance to see if they will cover the shingles shot.   

## 2014-02-12 ENCOUNTER — Other Ambulatory Visit: Payer: Self-pay | Admitting: Family Medicine

## 2014-02-12 LAB — URINALYSIS W MICROSCOPIC + REFLEX CULTURE
Bilirubin Urine: NEGATIVE
CASTS: NONE SEEN
CRYSTALS: NONE SEEN
Glucose, UA: NEGATIVE mg/dL
HGB URINE DIPSTICK: NEGATIVE
NITRITE: POSITIVE — AB
PH: 6 (ref 5.0–8.0)
Protein, ur: NEGATIVE mg/dL
Specific Gravity, Urine: 1.019 (ref 1.005–1.030)
Squamous Epithelial / LPF: NONE SEEN
UROBILINOGEN UA: 0.2 mg/dL (ref 0.0–1.0)

## 2014-02-12 MED ORDER — CIPROFLOXACIN HCL 250 MG PO TABS: 250.0000 mg | ORAL_TABLET | Freq: Two times a day (BID) | ORAL | Status: DC

## 2014-02-15 LAB — URINE CULTURE: Colony Count: >=100000

## 2014-02-18 MED ORDER — NITROFURANTOIN MACROCRYSTAL 50 MG PO CAPS: 50.0000 mg | ORAL_CAPSULE | Freq: Two times a day (BID) | ORAL | Status: AC

## 2014-02-18 NOTE — Addendum Note (Signed)
Addended by: Dianne DunARON, TALIA M on: 02/18/2014 07:46 AM   Modules accepted: Orders

## 2014-02-18 NOTE — Telephone Encounter (Signed)
Rx for nitrofurantoin sent.  Not first choice in geriatric population but she is allergic to or cx resistant to alternatives.  Lower dose given.

## 2014-02-19 ENCOUNTER — Other Ambulatory Visit: Payer: Self-pay | Admitting: Internal Medicine

## 2014-02-19 MED ORDER — NITROFURANTOIN MONOHYD MACRO 100 MG PO CAPS: 100.0000 mg | ORAL_CAPSULE | Freq: Two times a day (BID) | ORAL | Status: DC

## 2014-03-09 NOTE — Addendum Note (Signed)
Addended by: Dianne DunARON, Devone Bonilla M on: 03/09/2014 08:14 AM   Modules accepted: Kipp BroodSmartSet

## 2014-03-23 DIAGNOSIS — N390 Urinary tract infection, site not specified: Secondary | ICD-10-CM | POA: Diagnosis not present

## 2014-04-21 ENCOUNTER — Inpatient Hospital Stay
Admit: 2014-04-21 | Disposition: A | Discharge status: Skilled Nursing Facility | Payer: Self-pay | Attending: Internal Medicine | Admitting: Internal Medicine

## 2014-04-21 DIAGNOSIS — W19XXXA Unspecified fall, initial encounter: Secondary | ICD-10-CM | POA: Diagnosis not present

## 2014-04-21 DIAGNOSIS — G301 Alzheimer's disease with late onset: Secondary | ICD-10-CM | POA: Diagnosis not present

## 2014-04-21 DIAGNOSIS — M25551 Pain in right hip: Secondary | ICD-10-CM | POA: Diagnosis not present

## 2014-04-21 DIAGNOSIS — Z7401 Bed confinement status: Secondary | ICD-10-CM | POA: Diagnosis not present

## 2014-04-21 DIAGNOSIS — R079 Chest pain, unspecified: Secondary | ICD-10-CM | POA: Diagnosis not present

## 2014-04-21 DIAGNOSIS — K219 Gastro-esophageal reflux disease without esophagitis: Secondary | ICD-10-CM | POA: Diagnosis not present

## 2014-04-21 DIAGNOSIS — S299XXA Unspecified injury of thorax, initial encounter: Secondary | ICD-10-CM | POA: Diagnosis not present

## 2014-04-21 DIAGNOSIS — I10 Essential (primary) hypertension: Secondary | ICD-10-CM | POA: Diagnosis not present

## 2014-04-21 DIAGNOSIS — Z9181 History of falling: Secondary | ICD-10-CM | POA: Diagnosis not present

## 2014-04-21 DIAGNOSIS — M6281 Muscle weakness (generalized): Secondary | ICD-10-CM | POA: Diagnosis not present

## 2014-04-21 DIAGNOSIS — F341 Dysthymic disorder: Secondary | ICD-10-CM | POA: Diagnosis not present

## 2014-04-21 DIAGNOSIS — D649 Anemia, unspecified: Secondary | ICD-10-CM | POA: Diagnosis not present

## 2014-04-21 DIAGNOSIS — R2689 Other abnormalities of gait and mobility: Secondary | ICD-10-CM | POA: Diagnosis not present

## 2014-04-21 DIAGNOSIS — Z7982 Long term (current) use of aspirin: Secondary | ICD-10-CM | POA: Diagnosis not present

## 2014-04-21 DIAGNOSIS — Z96642 Presence of left artificial hip joint: Secondary | ICD-10-CM | POA: Diagnosis not present

## 2014-04-21 DIAGNOSIS — F0390 Unspecified dementia without behavioral disturbance: Secondary | ICD-10-CM | POA: Diagnosis not present

## 2014-04-21 DIAGNOSIS — E559 Vitamin D deficiency, unspecified: Secondary | ICD-10-CM | POA: Diagnosis not present

## 2014-04-21 DIAGNOSIS — S72141D Displaced intertrochanteric fracture of right femur, subsequent encounter for closed fracture with routine healing: Secondary | ICD-10-CM | POA: Diagnosis not present

## 2014-04-21 DIAGNOSIS — K5900 Constipation, unspecified: Secondary | ICD-10-CM | POA: Diagnosis not present

## 2014-04-21 DIAGNOSIS — F329 Major depressive disorder, single episode, unspecified: Secondary | ICD-10-CM | POA: Diagnosis not present

## 2014-04-21 DIAGNOSIS — G300 Alzheimer's disease with early onset: Secondary | ICD-10-CM | POA: Diagnosis not present

## 2014-04-21 DIAGNOSIS — S72001D Fracture of unspecified part of neck of right femur, subsequent encounter for closed fracture with routine healing: Secondary | ICD-10-CM | POA: Diagnosis not present

## 2014-04-21 DIAGNOSIS — N390 Urinary tract infection, site not specified: Secondary | ICD-10-CM | POA: Diagnosis not present

## 2014-04-21 DIAGNOSIS — F0280 Dementia in other diseases classified elsewhere without behavioral disturbance: Secondary | ICD-10-CM | POA: Diagnosis not present

## 2014-04-21 DIAGNOSIS — Z880 Allergy status to penicillin: Secondary | ICD-10-CM | POA: Diagnosis not present

## 2014-04-21 DIAGNOSIS — S72001A Fracture of unspecified part of neck of right femur, initial encounter for closed fracture: Secondary | ICD-10-CM | POA: Diagnosis not present

## 2014-04-21 DIAGNOSIS — M810 Age-related osteoporosis without current pathological fracture: Secondary | ICD-10-CM | POA: Diagnosis not present

## 2014-04-21 DIAGNOSIS — S72141A Displaced intertrochanteric fracture of right femur, initial encounter for closed fracture: Secondary | ICD-10-CM | POA: Diagnosis not present

## 2014-04-21 DIAGNOSIS — D62 Acute posthemorrhagic anemia: Secondary | ICD-10-CM | POA: Diagnosis not present

## 2014-04-21 LAB — COMPREHENSIVE METABOLIC PANEL
ALBUMIN: 3.9 g/dL
ALK PHOS: 99 U/L
ALT: 12 U/L — AB
ANION GAP: 3 — AB (ref 7–16)
BILIRUBIN TOTAL: 0.5 mg/dL
BUN: 32 mg/dL — ABNORMAL HIGH
CALCIUM: 9.1 mg/dL
CHLORIDE: 104 mmol/L
CREATININE: 1.25 mg/dL — AB
Co2: 29 mmol/L
EGFR (African American): 44 — ABNORMAL LOW
EGFR (Non-African Amer.): 38 — ABNORMAL LOW
Glucose: 168 mg/dL — ABNORMAL HIGH
POTASSIUM: 4 mmol/L
SGOT(AST): 20 U/L
Sodium: 136 mmol/L
Total Protein: 6.8 g/dL

## 2014-04-21 LAB — URINALYSIS, COMPLETE
BACTERIA: NONE SEEN
Bilirubin,UR: NEGATIVE
Glucose,UR: NEGATIVE mg/dL (ref 0–75)
Ketone: NEGATIVE
NITRITE: NEGATIVE
Ph: 5 (ref 4.5–8.0)
Protein: NEGATIVE
Specific Gravity: 1.02 (ref 1.003–1.030)
Squamous Epithelial: NONE SEEN
WBC UR: NONE SEEN /HPF (ref 0–5)

## 2014-04-21 LAB — CBC
HCT: 35.3 % (ref 35.0–47.0)
HGB: 11.5 g/dL — AB (ref 12.0–16.0)
MCH: 29.8 pg (ref 26.0–34.0)
MCHC: 32.7 g/dL (ref 32.0–36.0)
MCV: 91 fL (ref 80–100)
Platelet: 468 10*3/uL — ABNORMAL HIGH (ref 150–440)
RBC: 3.87 10*6/uL (ref 3.80–5.20)
RDW: 14.4 % (ref 11.5–14.5)
WBC: 7.6 10*3/uL (ref 3.6–11.0)

## 2014-04-21 LAB — PROTIME-INR
INR: 1
Prothrombin Time: 13.4 secs

## 2014-04-23 LAB — BASIC METABOLIC PANEL
ANION GAP: 5 — AB (ref 7–16)
BUN: 18 mg/dL
Calcium, Total: 8.5 mg/dL — ABNORMAL LOW
Chloride: 106 mmol/L
Co2: 25 mmol/L
Creatinine: 0.89 mg/dL
GFR CALC NON AF AMER: 57 — AB
Glucose: 125 mg/dL — ABNORMAL HIGH
POTASSIUM: 4 mmol/L
Sodium: 136 mmol/L

## 2014-04-23 LAB — HEMOGLOBIN: HGB: 9 g/dL — AB (ref 12.0–16.0)

## 2014-04-23 LAB — PLATELET COUNT: Platelet: 361 10*3/uL (ref 150–440)

## 2014-04-24 LAB — BASIC METABOLIC PANEL
Anion Gap: 4 — ABNORMAL LOW (ref 7–16)
BUN: 23 mg/dL — AB
CREATININE: 1.27 mg/dL — AB
Calcium, Total: 8.2 mg/dL — ABNORMAL LOW
Chloride: 108 mmol/L
Co2: 25 mmol/L
GFR CALC AF AMER: 43 — AB
GFR CALC NON AF AMER: 37 — AB
GLUCOSE: 141 mg/dL — AB
Potassium: 3.9 mmol/L
SODIUM: 137 mmol/L

## 2014-04-24 LAB — PLATELET COUNT: PLATELETS: 350 10*3/uL (ref 150–440)

## 2014-04-24 LAB — HEMOGLOBIN: HGB: 7.9 g/dL — ABNORMAL LOW (ref 12.0–16.0)

## 2014-04-25 LAB — CREATININE, SERUM
CREATININE: 1.08 mg/dL — AB
EGFR (African American): 52 — ABNORMAL LOW
EGFR (Non-African Amer.): 45 — ABNORMAL LOW

## 2014-04-25 LAB — HEMOGLOBIN: HGB: 8.8 g/dL — ABNORMAL LOW (ref 12.0–16.0)

## 2014-04-25 LAB — CLOSTRIDIUM DIFFICILE(ARMC)

## 2014-04-26 ENCOUNTER — Encounter
Admit: 2014-04-26 | Disposition: A | Discharge status: Home / Self Care | Payer: Self-pay | Attending: Internal Medicine | Admitting: Internal Medicine

## 2014-04-26 DIAGNOSIS — Z9181 History of falling: Secondary | ICD-10-CM | POA: Diagnosis not present

## 2014-04-26 DIAGNOSIS — R8290 Unspecified abnormal findings in urine: Secondary | ICD-10-CM | POA: Diagnosis not present

## 2014-04-26 DIAGNOSIS — M25551 Pain in right hip: Secondary | ICD-10-CM | POA: Diagnosis not present

## 2014-04-26 DIAGNOSIS — S72141D Displaced intertrochanteric fracture of right femur, subsequent encounter for closed fracture with routine healing: Secondary | ICD-10-CM | POA: Diagnosis not present

## 2014-04-26 DIAGNOSIS — I10 Essential (primary) hypertension: Secondary | ICD-10-CM | POA: Diagnosis not present

## 2014-04-26 DIAGNOSIS — N390 Urinary tract infection, site not specified: Secondary | ICD-10-CM | POA: Diagnosis not present

## 2014-04-26 DIAGNOSIS — G9340 Encephalopathy, unspecified: Secondary | ICD-10-CM | POA: Diagnosis not present

## 2014-04-26 DIAGNOSIS — M6281 Muscle weakness (generalized): Secondary | ICD-10-CM | POA: Diagnosis not present

## 2014-04-26 DIAGNOSIS — S72141A Displaced intertrochanteric fracture of right femur, initial encounter for closed fracture: Secondary | ICD-10-CM | POA: Diagnosis not present

## 2014-04-26 DIAGNOSIS — M810 Age-related osteoporosis without current pathological fracture: Secondary | ICD-10-CM | POA: Diagnosis not present

## 2014-04-26 DIAGNOSIS — F0280 Dementia in other diseases classified elsewhere without behavioral disturbance: Secondary | ICD-10-CM | POA: Diagnosis not present

## 2014-04-26 DIAGNOSIS — G301 Alzheimer's disease with late onset: Secondary | ICD-10-CM | POA: Diagnosis not present

## 2014-04-26 DIAGNOSIS — G300 Alzheimer's disease with early onset: Secondary | ICD-10-CM | POA: Diagnosis not present

## 2014-04-26 DIAGNOSIS — F329 Major depressive disorder, single episode, unspecified: Secondary | ICD-10-CM | POA: Diagnosis not present

## 2014-04-26 DIAGNOSIS — F0390 Unspecified dementia without behavioral disturbance: Secondary | ICD-10-CM | POA: Diagnosis not present

## 2014-04-26 DIAGNOSIS — R410 Disorientation, unspecified: Secondary | ICD-10-CM | POA: Diagnosis not present

## 2014-04-26 DIAGNOSIS — M79651 Pain in right thigh: Secondary | ICD-10-CM | POA: Diagnosis not present

## 2014-04-26 DIAGNOSIS — R2689 Other abnormalities of gait and mobility: Secondary | ICD-10-CM | POA: Diagnosis not present

## 2014-04-26 DIAGNOSIS — Z7401 Bed confinement status: Secondary | ICD-10-CM | POA: Diagnosis not present

## 2014-04-26 DIAGNOSIS — D649 Anemia, unspecified: Secondary | ICD-10-CM | POA: Diagnosis not present

## 2014-04-28 DIAGNOSIS — F0390 Unspecified dementia without behavioral disturbance: Secondary | ICD-10-CM | POA: Diagnosis not present

## 2014-04-28 DIAGNOSIS — M810 Age-related osteoporosis without current pathological fracture: Secondary | ICD-10-CM | POA: Diagnosis not present

## 2014-04-28 DIAGNOSIS — G9340 Encephalopathy, unspecified: Secondary | ICD-10-CM | POA: Diagnosis not present

## 2014-05-01 NOTE — H&P (Signed)
PATIENT NAME:  Christine HeathPPLE, Kingsley M MR#:  [id number] DATE OF BIRTH:  [birth year]  DATE OF ADMISSION:  10/14/2013  REFERRING EMERGENCY ROOM PHYSICIAN: Caryn BeeKevin A. Paduchowski, MD.   PRIMARY CARE PHYSICIAN: Karie Schwalbeichard I. Letvak, [facility] at [facility].    CHIEF COMPLAINT: Right arm pain and weakness with confusion.   HISTORY OF PRESENT ILLNESS: The history is provided by the patient's daughter-in-law, Sheral FlowSheree Armentor, as the patient is fairly confused at the time of examination. Four to 5 days ago the patient became confused. She has mild baseline dementia, but became much more disoriented than usual. Sheree came to take her out for ice cream as is their weekly routine last Sunday and found that the patient was disoriented, weak, not participating in conversation as usual. Leeann MustYesterday Sheree came to visit her again and found the patient to be very confused with right shoulder pain and right arm weakness. She also noted that the patient's head was tilted to the left and that she seemed to have a facial droop.  This morning the patient was seen by the physician at [facility] [place] and was sent to the Emergency Room for further evaluation.   PAST MEDICAL HISTORY: 1.  Hypertension.  2.  Osteoporosis.  3.  Vitamin D deficiency.  4.  Mild baseline dementia.  5.  Gastroesophageal reflux disease.  6.  Chronic kidney disease stage III with a baseline creatinine of about 1.   PAST SURGICAL HISTORY: Hysterectomy and cholecystectomy.   SOCIAL HISTORY: The patient is a current resident [facility] [address] [facility] [address] [place]. She does not smoke cigarettes or drink alcohol. She uses a walker for ambulation. She does not use supplemental oxygen. Her power of attorney is Nucor CorporationSheree Kuna, her daughter-in-law. She is estranged from her daughter, Angelena Soleancy Mccarron.   FAMILY HISTORY: Negative for stroke. Her mother died of colon cancer. Her son has diabetes.   ALLERGIES: NOTE THAT PAPERWORK FROM THE [facility] [facility] THAT SHE  HAS NO KNOWN DRUG ALLERGIES. HOWEVER, PER THIS EMR, THE PATIENT IS ALLERGIC TO XANAX, PENICILLIN, SULFA DRUGS, CODEINE, IV DYE, PREDNISONE, PREVACID, PRAVACHOL.   HOME MEDICATIONS: 1.  Vitamin D3 at 2000 international units 1 capsule once a day.  2.  Polyethylene glycol 17 grams orally once a day.  3.  Aspirin 81 mg 1 tablet once a day.  4.  Acetaminophen 325 mg 2 tablets every 4 hours as needed for pain.  5.  Clindamycin 150 mg 4 capsules once as needed 1 hour prior to dental visit.   REVIEW OF SYSTEMS: Unable to obtain review of systems due to patient confusion.   PHYSICAL EXAMINATION: VITAL SIGNS: Temperature 97.8, pulse 79, respirations 19, blood pressure 163/79, oxygenation 97% on room air.  GENERAL: No acute distress.  HEENT: Pupils are equal, round, and reactive to light. Conjunctivae are clear. Oral mucous membranes are pink and moist. Good dentition. Posterior oropharynx is clear with no exudate. No edema. Trachea is midline. No cervical lymphadenopathy.  PULMONARY: Lungs are clear to auscultation bilaterally with good air movement.  CARDIOVASCULAR: Regular rate and rhythm, no murmurs, rubs, or gallops. Peripheral pulses are 1+. There is no dependent edema.  ABDOMEN: Soft, nontender, nondistended. Bowel sounds are normal. There is no guarding or rebound.  MUSCULOSKELETAL: There are no tender or swollen joints. Range of motion is normal in all joints. She does have decreased strength in the right upper extremity compared to the left. She is diffusely weak with strength 3/4 in the lower  extremities bilaterally.  NEUROLOGIC: The patient has a right-sided facial droop. Tongue points to the left on extension. She has right upper extremity weakness, diffuse weakness in the lower extremities equally, she is confused, not oriented to place or date, oriented only to self.  PSYCHIATRIC: The patient is calm. No signs at this time of uncontrolled anxiety.   LABORATORY DATA: Sodium 142, potassium  4.0, chloride 109, bicarbonate 29, BUN 23, creatinine 1.2, serum glucose 116. LFTs show decreased serum albumin at 3.3, increased total bilirubin at 1.2, otherwise normal. Troponin negative at less than 0.02. White blood cells 9.7, hemoglobin 12.0, platelets 498,000, MCV is 93. Urinalysis shows signs of urinary tract infection with 400 white blood cells per high-powered field.   IMAGING:  1.  X-ray of the right elbow shows no fracture or acute bony findings.  2.  X-ray of right shoulder shows probable chronic T6 compression fracture, no shoulder abnormality.  3.  CT scan of the head without contrast shows findings concerning for potential small recent acute infarct in the posterior left corona radiata superiorly. No other findings concerning for potential infarcts. Atrophy with patchy periventricular small vessel disease.   ASSESSMENT AND PLAN: 1.  Small acute infarct in the left corona radiata area: Admit to telemetry. I have ordered a 2-D echocardiogram and carotid Dopplers. We will continue with aspirin at this time for secondary prevention. May need to change if etiology of stroke is determined. Have ordered physical and speech therapy consultations.  2.  Urinary tract infection: Urine cultures are pending. She has received one dose of Levaquin in the Emergency Room and will continue to receive 750 mg IV Levaquin daily.  3.  Hypertension. Blood pressure is currently slightly elevated at 167. In patients with recent stroke, allow for permissive hypertension. We will hold off on treating her blood pressure for now. She is not on any chronic antihypertensives.  4.  Dementia: She has baseline mild dementia. Currently she is much more confused than usual probably due to urinary tract infection.  5.  Chronic kidney disease stage III: Stable, continue to monitor.   TIME SPENT ON ADMISSION: 40 minutes.    ____________________________ Ena Dawley. Clent Ridges, MD cpw:at D: 10/14/2013 16:17:54  ET T: 10/14/2013 16:43:54 ET JOB#: [id number]  cc: Santina Evans P. Clent Ridges, MD, <Dictator> Gale Journey MD ELECTRONICALLY SIGNED 10/19/2013 14:02

## 2014-05-01 NOTE — Discharge Summary (Signed)
PATIENT NAME:  Christine Vance, Christine Vance MR#:  [id number] DATE OF BIRTH:  [birth year]  DATE OF ADMISSION:  10/14/2013 DATE OF DISCHARGE:  10/16/2013  DISCHARGE DIAGNOSES:  1.  Altered mental status secondary to urinary tract infection.  2.  Escherichia coli urinary tract infection.  3.  Hypertension.   DISCHARGE MEDICATIONS:  1.  MiraLax as needed for constipation. 2.  Aspirin 81 mg daily.  3.  tylenol 325 mg 2 tablets every 4 hours as needed. 4.  Patient takes clindamycin 4 capsules each of 150 mg 1 hour prior to dental procedures. 5.  Keflex 500 mg every 8 hours for 7 days.   DIET: Mechanical soft diet with nectar-thick liquids.   CONSULTATIONS: Speech therapy consult and physical therapy consult  DISCHARGE VITAL SIGNS: Temperature 98.2, heart rate 83, blood pressure 155/70, and oxygen saturation 97% on room air.   [facility]. The patient's healthcare power-of-attorney is daughter-in-law and Christine Vance is her name, and I spoke to her about discharge plan.   HOSPITAL COURSE: This is a 81 year old female patient with dementia who came because of right arm pain, weakness, and confusion. Please look at the history and physical for full details. The patient's daughter-in-law noticed that she is more confused and also had some right-sided shoulder pain and weakness. The patient lives at [facility] [place] and sent to ER for that. 1.  The patient's workup showed UA was positive for 400 WBC and her electrolytes were normal. Chest x-ray did not show any acute changes. CT of the head concerning for small recent acute infarct in the posterior left corona radiata. So she was admitted for a possible small infarct. She was admitted to telemetry, started on a stroke workup. She was started on aspirin. We ( had   MRI, and carotid ultrasound. The patient was seen by physical therapy and speech therapy, as well. Did not have any further neurological deficit. Able to follow commands and participate  in physical therapy.  The patient's shoulder x-ray on admission  did not show any acute fractures. She had chronic compression fracture on x-ray. She also had a right elbow x-ray on admission which showed no acute abnormalities. The patient's carotid ultrasound showed very minimal bilateral necrotic plaque. No hemodynamically significant stenosis was observed. MRI of the brain showed no acute abnormalities, mild chronic small-vessel ischemic changes. The patient's echocardiogram showed the patient has an EF of 45% to 50% with mild systolic dysfunction. LDL is at goal at 71. The patient was seen by physical therapy. They recommended skilled nursing. The patient also seen by speech therapy. They changed her dysphagia 2 diet to nectar-thick liquids, and she tolerated the diet well. The patient does have chronic bending of the head towards the left side, and the patient tolerated the diet well and the patient continued on aspirin. Physical therapy saw the patient and they recommended short-term rehabilitation versus SNF. [facility].  2.  UTI. The patient's urine culture showed Escherichia coli sensitive to cephalosporins, resistant to Levaquin. Initially when she was admitted she was started on Levaquin. The patient's urine culture showed Escherichia coli resistant to Cipro and Levaquin. Patient has allergy to PENICILLIN. I spoke with pharmacy. We tried the Keflex and the patient's urine cultures were sensitive to cefazolin, cefoxitin and ceftriaxone, so we tried the Keflex 500 mg 1 dose here in the hospital to watch her, and she tolerated the Keflex well without any side effects, so we discharged her with Keflex  and I discussed this with the patient's daughter-in-law, Christine Rua.  CODE STATUS: Full code.  TIME SPENT ON DISCHARGE PREPARATION: More than 30 minutes.   ____________________________ Katha Hamming, MD sk:ST D: 10/17/2013 21:06:00 ET T: 10/17/2013 22:23:38  ET JOB#: [id number]  cc: Rhona Leavens. Burnett Sheng, MD Katha Hamming, MD, <Dictator>   Katha Hamming MD ELECTRONICALLY SIGNED 10/31/2013 18:12

## 2014-05-08 LAB — URINALYSIS, COMPLETE
BILIRUBIN, UR: NEGATIVE
BLOOD: NEGATIVE
Glucose,UR: NEGATIVE mg/dL (ref 0–75)
KETONE: NEGATIVE
Nitrite: NEGATIVE
PH: 5 (ref 4.5–8.0)
PROTEIN: NEGATIVE
SPECIFIC GRAVITY: 1.019 (ref 1.003–1.030)

## 2014-05-09 ENCOUNTER — Encounter: Admission: RE | Admit: 2014-05-09 | Payer: Medicare Other | Source: Ambulatory Visit | Admitting: Internal Medicine

## 2014-05-09 NOTE — H&P (Addendum)
PATIENT NAME:  Christine Vance, Christine Vance MR#:  [id number] DATE OF BIRTH:  [birth year]  DATE OF ADMISSION:  04/21/2014  PRIMARY DOCTOR: Rhona Leavens. Burnett Sheng, MD.  EMERGENCY ROOM PHYSICIAN: Sheryl L. Mindi Junker, MD    CHIEF COMPLAINT: Right hip pain.   HISTORY OF PRESENT ILLNESS: The patient is a 81 year old female patient brought in from [place] because of fall and right hip pain. The patient had a mechanical fall, did not have syncope. The patient was using a walker and she was trying sit on the bed, but she fell on the floor and suffered a right hip fracture.   PAST MEDICAL HISTORY: Significant for dementia, hypertension, and constipation. Also includes osteoporosis, vitamin D deficiency, GERD,   PAST SURGICAL HISTORY: Hysterectomy and cholecystectomy.   SOCIAL HISTORY: She is a resident of The [address] No smoking, no drinking. She is estranged from our daughters. Her power of attorney is her daughter-in-law.  FAMILY HISTORY: No strokes. Mother had colon cancer. This history is obtained from a previous source.   ALLERGIES: LISTED AS ALLERGIC TO XANAX, IV DYE, CODEINE, PENICILLIN, PREDNISONE, PREVACID.   REVIEW OF SYSTEMS: Not obtainable secondary to her dementia.   MEDICATIONS: Aspirin 81 mg daily, Colace with Senna 50/8.6 mg p.o. daily magnesium hydroxide suspension 30 mL as needed for constipation, Mapap 325 mg 1-2 tablets p.o. b.i.d., MiraLax as needed for constipation, Zoloft 20 mg p.o. daily, tramadol 50 mg p.o. daily, vitamin D 2000 units once a day.   PHYSICAL EXAMINATION: VITAL SIGNS: Temperature 97.9, heart rate 86, blood pressure 180/65, saturations 99% on room air.  GENERAL: A 81 year old, well-developed, well-nourished female, slightly confused, having pain in the right hip and unable to move her right hip.  HEAD: Normocephalic, atraumatic.  EYES: Pupils equal, react to light. No conjunctival pallor. No scleral icterus.  NOSE: No turbinate hypertrophy, no drainage.  EARS: No  drainage, no external lesions.  MOUTH: No lesions, no exudates. Oral mucosa is clinically dry.  NECK: No thyroid enlargement. No masses. No lymphadenopathy.  RESPIRATORATIONS: Bilaterally clear to auscultation. No wheeze, no rales.  CARDIOVASCULAR: S1, S2 regular, slightly tachycardic. No murmurs. Pulses equal at carotid, femoral, and dorsalis pedis. No pedal edema.  GASTROINTESTINAL: Abdomen is soft, nontender, nondistended. Bowel sounds present.  MUSCULOSKELETAL: :Unable to move right legs, and right leg is internally rotated and limited range of motion in the right hip because of fracture.  SKIN: Inspection is normal. No cervical lymphadenopathy. No lymphadenopathy.  NEUROLOGIC: The patient is confused, but no gross focal neurological deficits. Able to follow commands. Cranial nerves II through XII intact. Power 5/5 in upper and lower extremities. Sensory intact. Deep tendon reflexes are 2+ upper extremities and lower extremities.   LABORATORY DATA: Sodium is 136, potassium 4, chloride 104, bicarbonate 29, BUN 32, creatinine 1.24, glucose 168. CBC: WBC 7.6, hemoglobin 11.3, hematocrit 33.3, platelets 468,000. INR is 1. Hip x-ray shows right femur intertrochanteric fracture, history of left hip replacement. The patient had osseous demineralization of left old pubic body fracture. Chest x-ray shows no edema or consolidation. EKG shows normal sinus at 82 beats per minute.   ASSESSMENT AND PLAN: 1. The patient is a 81 year old female patient with advanced dementia, has right femur intertrochanteric fracture, which is displaced. The patient is admitted to medical service onto the floor. She is at moderate risk for surgery, given her advanced age and dementia. The patient, right now, is on pain medication with morphine and oxycodone. Continue deep vein thrombosis prophylaxis with Lovenox 40 subcutaneous  daily. > orthopedic consult with Dr. Ernest PineHooten.  2. Hypertension. No history of high blood pressure  before. Start  home meds continue metoprolol XL 20 mg daily.  3. Constipation. Continue her MiraLax, and Senna.  4. History of depression. Continue Zoloft.   TIME SPENT: 55 minutes.     ____________________________ Katha HammingSnehalatha Ellard Nan, MD sk:mw D: 04/21/2014 15:33:08 ET T: 04/21/2014 16:30:41 ET JOB#: [id number]  cc: Katha HammingSnehalatha Vitaliy Eisenhour, MD, <Dictator> Rhona LeavensJames F. Burnett ShengHedrick, MD Illene LabradorJames P. Angie FavaHooten Jr., MD  Katha HammingSNEHALATHA Yuepheng Schaller MD ELECTRONICALLY SIGNED 05/11/2014 13:15

## 2014-05-09 NOTE — Consult Note (Signed)
Brief Consult Note: Diagnosis: Right pertrochanteric femur fracture.   Patient was seen by consultant.   Orders entered.   Comments: discussed with patient and her daughter-in-law (POA) via phone. Recommended ORIF. The risks and benefits of surgical intervention were discussed in detail. The patient's POA expressed understanding of the risks and benefits and agreed with plans for surgery.  Surgical site signed as per "right site surgery" protocol.  Electronic Signatures: Donato HeinzHooten, James P (MD)  (Signed 13-Apr-16 17:13)  Authored: Brief Consult Note   Last Updated: 13-Apr-16 17:13 by Donato HeinzHooten, James P (MD)

## 2014-05-09 NOTE — Op Note (Signed)
PATIENT NAME:  Christine Vance, Christine Vance MR#:  [id number] DATE OF BIRTH:  08-01-1920  DATE OF PROCEDURE:  04/22/2014  PREOPERATIVE DIAGNOSIS: Comminuted right intertrochanteric femur fracture.   POSTOPERATIVE DIAGNOSIS: Comminuted right intertrochanteric femur fracture.   PROCEDURE PERFORMED: Open reduction and internal fixation of right intertrochanteric femur fracture.   SURGEON: Illene Labrador. Angie Fava., MD   ANESTHESIA: Spinal.   ESTIMATED BLOOD LOSS: 100 mL.   FLUIDS REPLACED: 750 mL of crystalloid.   DRAINS: None.  IMPLANTS UTILIZED: Synthes 130-degree 11 x 340 mm trochanteric fixation nail, 85 mm helical blade, and a 5.0 mm locking screw.  INDICATIONS FOR SURGERY: The patient is a 81 [place] who apparently fell and landed on her right hip and leg. X-rays obtained at [facility] Emergency Room demonstrated a comminuted right intertrochanteric femur fracture. After discussion of the risks and benefits of surgical intervention with the patient and her power-of-attorney, they expressed understanding of the risks, benefits, and agreed with plans for surgical intervention.   PROCEDURE IN DETAIL: The patient was brought to the operating room and, after adequate spinal anesthesia was achieved, the patient was transferred to the fracture table. Traction was applied to the right lower extremity and provisional reduction was performed. The patient's right hip and leg were cleaned and prepped with alcohol and DuraPrep and draped in the usual sterile fashion. A "timeout" was performed as per usual protocol. A lateral longitudinal incision was made extending from the tip of the greater trochanter proximally. Fascia was incised and dissection was carried down to the tip of the greater trochanter. A distally threaded guidewire was inserted through the tip of the trochanter and into the medullary canal. Position was confirmed using C-arm. A step drill was then used to enlarge the entry site. The  distally threaded guide pin was exchanged to a beaded guidewire. This was advanced down the shaft and positioned distally for placement of a long trochanteric fixation nail. Measurements were obtained, and it was felt that a 340 mm length was appropriate. The canal was reamed in a sequential fashion up to a 12 mm diameter. A 130-degree 11 x 340 mm trochanteric fixation nail was then advanced over the guidewire. Good position was noted in multiple planes using the C-arm. Next, a stab incision was made laterally and the tissue protector was inserted through the outrigger device and advanced to the lateral cortex of the proximal femur. A distally threaded guide pin was inserted into the femoral neck and head, with good position noted in both AP and lateral planes. Measurements were obtained and it was felt that an 85 mm helical blade was appropriate. Cortex was reamed, followed by insertion of a cannulated reamer. Next, the 85 mm helical blade was then advanced over the guidewire and impacted into place. Good position was noted in both AP and lateral planes. Set screw was engaged. Next, the C-arm was positioned so as to visualize the distal locking holes in the nail. A stab incision was made and a 5.0 mm distal locking screw was inserted through the slotted position. Good position was noted in multiple planes using the C-arm.   Wounds were irrigated with copious amounts of normal saline with antibiotic solution using bulb syringe and then suctioned dry. Good hemostasis was appreciated. Fascia was reapproximated using interrupted sutures of #1 Vicryl. The subcutaneous tissue was approximated in layers using first #0 Vicryl followed by #2-0 Vicryl. Skin was closed with skin staples. Sterile dressing was applied.  The patient tolerated the  procedure well. She was transported to the recovery room in stable condition.   ____________________________ Illene LabradorJames P. Angie FavaHooten Jr., MD jph:ST D: 04/22/2014 22:50:23  ET T: 04/23/2014 02:23:47 ET JOB#: [id number]  cc: Illene LabradorJames P. Angie FavaHooten Jr., MD, <Dictator> JAMES P Angie FavaHOOTEN JR MD ELECTRONICALLY SIGNED 05/06/2014 7:12

## 2014-05-09 NOTE — Discharge Summary (Signed)
PATIENT NAME:  Christine HeathPPLE, Christine M MR#:  [id number] DATE OF BIRTH:  1920/08/21  DATE OF ADMISSION:  04/21/2014 DATE OF DISCHARGE:  04/26/2014  PRESENTING COMPLAINT: Fall.   DISCHARGE DIAGNOSES: 1.  Mechanical fall status post right femur intertrochanteric fracture status post repair.  2.  Dementia.  3.  Constipation, resolved.  4.  History of depression.   CODE STATUS: FULL.  CONSULTANTS: Orthopedics - Dr. Ernest PineHooten.  PROCEDURES: ORIF intertrochanteric fracture, right.   DIAGNOSTIC DATA: Labs at discharge: Creatinine 1.08. C. diff negative. Hemoglobin 8.8.   BRIEF SUMMARY OF HOSPITAL COURSE: Ms. Modena Vance is a pleasant 81 year old Caucasian female with history of dementia and depression who comes to the Emergency Room after she had a fall. She was admitted with:  1.  Right intertrochanteric femur fracture. Underwent surgery with ORIF, done by Dr. Ernest PineHooten. Postop the patient did well. Today is postop day 4.  She will be discharged for rehab.  2.  Elevated blood pressure initially on admission. No history of hypertension. She received perioperative beta blockers. Blood pressure is on the lower side. We will DC metoprolol.  3.  Constipation. Resolved.  4.  History of depression and dementia. Continued home medications.  Hospital stay otherwise remained stable. The patient remained a FULL code.  DISCHARGE MEDICATIONS:  1.  Polyethylene glycol 17 grams p.o. daily as needed.  2.  Aspirin 81 mg daily.  3.  Sertraline 25 mg daily.  4.  Vitamin D3 2,000 international units p.o. daily.  5.  Acetaminophen 500 one tablet every 4 hours as needed.  6.  Tramadol 50 mg 1 to 2 q. 4 p.r.n.  7.  Lovenox 30 mg subcutaneous daily for 14 days. This is per ortho recommendation.  8.  Dulcolax 10 mg rectal suppository as needed.  9.  Docusate senna 50/8.6. p.o. b.i.d.  10.  Mag-Ox 30 mL b.i.d. as needed.   DISCHARGE INSTRUCTIONS: Follow up with Dr. Tera MaterJim Hedrick in 2 to 4 weeks.  TIME SPENT: 40 minutes.    ____________________________ Wylie HailSona A. Allena KatzPatel, MD sap:sb D: 04/26/2014 07:18:58 ET T: 04/26/2014 07:41:36 ET JOB#: [id number]  cc: Ulysess Witz A. Allena KatzPatel, MD, <Dictator> Willow OraSONA A Kenta Laster MD ELECTRONICALLY SIGNED 04/28/2014 10:35

## 2014-05-10 LAB — URINE CULTURE

## 2014-05-21 ENCOUNTER — Other Ambulatory Visit
Admission: RE | Admit: 2014-05-21 | Discharge: 2014-05-21 | Disposition: A | Discharge status: Home / Self Care | Payer: Medicare Other | Source: Skilled Nursing Facility | Attending: Internal Medicine | Admitting: Internal Medicine

## 2014-05-21 DIAGNOSIS — R8290 Unspecified abnormal findings in urine: Secondary | ICD-10-CM | POA: Insufficient documentation

## 2014-05-21 LAB — URINALYSIS COMPLETE WITH MICROSCOPIC (ARMC ONLY)
Bilirubin Urine: NEGATIVE
Glucose, UA: NEGATIVE mg/dL
Hgb urine dipstick: NEGATIVE
Ketones, ur: NEGATIVE mg/dL
Nitrite: NEGATIVE
PROTEIN: NEGATIVE mg/dL
Specific Gravity, Urine: 1.021 (ref 1.005–1.030)
pH: 5 (ref 5.0–8.0)

## 2014-05-23 LAB — URINE CULTURE

## 2014-05-28 ENCOUNTER — Telehealth: Payer: Self-pay

## 2014-05-28 NOTE — Telephone Encounter (Signed)
Christine Vance left v/m; Christine Vance is pts POA; pt is presently in rehab; Christine Vance wants to know if Dr Dayton MartesAron would sign FL2 for pt to go to assisted living or nursing home.Pt had annual exam on 02/11/2014. Christine Vance request cb with steps of what will need to be done to get pt placed.

## 2014-05-31 NOTE — Telephone Encounter (Signed)
Lm on Christine Vance's vm requesting a call back

## 2014-05-31 NOTE — Telephone Encounter (Signed)
I thought she was in SNF previously.  Please call to get more information.  I would be happy to sign an FL2.

## 2014-06-02 NOTE — Telephone Encounter (Addendum)
Spoke to CasselmanSheree who states [facility] and has not previously been in a SNF previously, but assisted living. Would you be able to complete FL2. Ok to leave detailed vm or attempt to contact daughter in law at alternate tele# [phone]

## 2014-06-08 DIAGNOSIS — S72141D Displaced intertrochanteric fracture of right femur, subsequent encounter for closed fracture with routine healing: Secondary | ICD-10-CM | POA: Diagnosis not present

## 2014-06-09 ENCOUNTER — Encounter
Admission: RE | Admit: 2014-06-09 | Discharge: 2014-06-09 | Disposition: A | Discharge status: Home / Self Care | Payer: Medicare Other | Source: Ambulatory Visit | Attending: Internal Medicine | Admitting: Internal Medicine

## 2014-06-15 DIAGNOSIS — F0281 Dementia in other diseases classified elsewhere with behavioral disturbance: Secondary | ICD-10-CM | POA: Diagnosis not present

## 2014-06-15 DIAGNOSIS — G9340 Encephalopathy, unspecified: Secondary | ICD-10-CM | POA: Diagnosis not present

## 2014-06-16 DIAGNOSIS — I10 Essential (primary) hypertension: Secondary | ICD-10-CM | POA: Diagnosis not present

## 2014-06-16 DIAGNOSIS — K5900 Constipation, unspecified: Secondary | ICD-10-CM | POA: Diagnosis not present

## 2014-06-16 DIAGNOSIS — G309 Alzheimer's disease, unspecified: Secondary | ICD-10-CM | POA: Diagnosis not present

## 2014-06-16 DIAGNOSIS — S72141D Displaced intertrochanteric fracture of right femur, subsequent encounter for closed fracture with routine healing: Secondary | ICD-10-CM | POA: Diagnosis not present

## 2014-06-16 DIAGNOSIS — N183 Chronic kidney disease, stage 3 (moderate): Secondary | ICD-10-CM | POA: Diagnosis not present

## 2014-06-16 DIAGNOSIS — N390 Urinary tract infection, site not specified: Secondary | ICD-10-CM | POA: Diagnosis not present

## 2014-06-16 DIAGNOSIS — F329 Major depressive disorder, single episode, unspecified: Secondary | ICD-10-CM | POA: Diagnosis not present

## 2014-06-16 DIAGNOSIS — F0280 Dementia in other diseases classified elsewhere without behavioral disturbance: Secondary | ICD-10-CM | POA: Diagnosis not present

## 2014-06-16 DIAGNOSIS — B9620 Unspecified Escherichia coli [E. coli] as the cause of diseases classified elsewhere: Secondary | ICD-10-CM | POA: Diagnosis not present

## 2014-06-16 DIAGNOSIS — R4702 Dysphasia: Secondary | ICD-10-CM | POA: Diagnosis not present

## 2014-06-16 DIAGNOSIS — G301 Alzheimer's disease with late onset: Secondary | ICD-10-CM | POA: Diagnosis not present

## 2014-06-16 DIAGNOSIS — R41841 Cognitive communication deficit: Secondary | ICD-10-CM | POA: Diagnosis not present

## 2014-06-17 DIAGNOSIS — I10 Essential (primary) hypertension: Secondary | ICD-10-CM | POA: Diagnosis not present

## 2014-06-17 DIAGNOSIS — R4702 Dysphasia: Secondary | ICD-10-CM | POA: Diagnosis not present

## 2014-06-17 DIAGNOSIS — G309 Alzheimer's disease, unspecified: Secondary | ICD-10-CM | POA: Diagnosis not present

## 2014-06-17 DIAGNOSIS — N183 Chronic kidney disease, stage 3 (moderate): Secondary | ICD-10-CM | POA: Diagnosis not present

## 2014-06-21 DIAGNOSIS — K5900 Constipation, unspecified: Secondary | ICD-10-CM | POA: Diagnosis not present

## 2014-06-21 DIAGNOSIS — F0280 Dementia in other diseases classified elsewhere without behavioral disturbance: Secondary | ICD-10-CM | POA: Diagnosis not present

## 2014-06-21 DIAGNOSIS — S72141D Displaced intertrochanteric fracture of right femur, subsequent encounter for closed fracture with routine healing: Secondary | ICD-10-CM | POA: Diagnosis not present

## 2014-06-21 DIAGNOSIS — G309 Alzheimer's disease, unspecified: Secondary | ICD-10-CM | POA: Diagnosis not present

## 2014-07-13 DIAGNOSIS — M6281 Muscle weakness (generalized): Secondary | ICD-10-CM | POA: Diagnosis not present

## 2014-07-15 DIAGNOSIS — F419 Anxiety disorder, unspecified: Secondary | ICD-10-CM | POA: Diagnosis not present

## 2014-07-21 DIAGNOSIS — K5900 Constipation, unspecified: Secondary | ICD-10-CM | POA: Diagnosis not present

## 2014-07-21 DIAGNOSIS — S72141D Displaced intertrochanteric fracture of right femur, subsequent encounter for closed fracture with routine healing: Secondary | ICD-10-CM | POA: Diagnosis not present

## 2014-07-23 DIAGNOSIS — R300 Dysuria: Secondary | ICD-10-CM | POA: Diagnosis not present

## 2014-07-26 DIAGNOSIS — R630 Anorexia: Secondary | ICD-10-CM | POA: Diagnosis not present

## 2014-07-26 DIAGNOSIS — K5900 Constipation, unspecified: Secondary | ICD-10-CM | POA: Diagnosis not present

## 2014-07-30 DIAGNOSIS — Z79899 Other long term (current) drug therapy: Secondary | ICD-10-CM | POA: Diagnosis not present

## 2014-08-20 DIAGNOSIS — R293 Abnormal posture: Secondary | ICD-10-CM | POA: Diagnosis not present

## 2014-08-23 DIAGNOSIS — R293 Abnormal posture: Secondary | ICD-10-CM | POA: Diagnosis not present

## 2014-08-23 DIAGNOSIS — R630 Anorexia: Secondary | ICD-10-CM | POA: Diagnosis not present

## 2014-08-23 DIAGNOSIS — K5900 Constipation, unspecified: Secondary | ICD-10-CM | POA: Diagnosis not present

## 2014-08-24 DIAGNOSIS — R293 Abnormal posture: Secondary | ICD-10-CM | POA: Diagnosis not present

## 2014-08-25 DIAGNOSIS — R293 Abnormal posture: Secondary | ICD-10-CM | POA: Diagnosis not present

## 2014-08-26 DIAGNOSIS — R293 Abnormal posture: Secondary | ICD-10-CM | POA: Diagnosis not present

## 2014-08-26 DIAGNOSIS — S72141D Displaced intertrochanteric fracture of right femur, subsequent encounter for closed fracture with routine healing: Secondary | ICD-10-CM | POA: Diagnosis not present

## 2014-08-27 DIAGNOSIS — R293 Abnormal posture: Secondary | ICD-10-CM | POA: Diagnosis not present

## 2014-08-30 DIAGNOSIS — R293 Abnormal posture: Secondary | ICD-10-CM | POA: Diagnosis not present

## 2014-08-31 DIAGNOSIS — R293 Abnormal posture: Secondary | ICD-10-CM | POA: Diagnosis not present

## 2014-09-01 DIAGNOSIS — R293 Abnormal posture: Secondary | ICD-10-CM | POA: Diagnosis not present

## 2014-09-02 DIAGNOSIS — R293 Abnormal posture: Secondary | ICD-10-CM | POA: Diagnosis not present

## 2014-09-03 DIAGNOSIS — R293 Abnormal posture: Secondary | ICD-10-CM | POA: Diagnosis not present

## 2014-09-06 DIAGNOSIS — M6281 Muscle weakness (generalized): Secondary | ICD-10-CM | POA: Diagnosis not present

## 2014-09-06 DIAGNOSIS — R293 Abnormal posture: Secondary | ICD-10-CM | POA: Diagnosis not present

## 2014-09-06 DIAGNOSIS — K5900 Constipation, unspecified: Secondary | ICD-10-CM | POA: Diagnosis not present

## 2014-09-07 DIAGNOSIS — R293 Abnormal posture: Secondary | ICD-10-CM | POA: Diagnosis not present

## 2014-09-07 DIAGNOSIS — F0150 Vascular dementia without behavioral disturbance: Secondary | ICD-10-CM | POA: Diagnosis not present

## 2014-09-07 DIAGNOSIS — F0631 Mood disorder due to known physiological condition with depressive features: Secondary | ICD-10-CM | POA: Diagnosis not present

## 2014-09-08 DIAGNOSIS — R293 Abnormal posture: Secondary | ICD-10-CM | POA: Diagnosis not present

## 2014-09-09 DIAGNOSIS — R293 Abnormal posture: Secondary | ICD-10-CM | POA: Diagnosis not present

## 2014-09-10 DIAGNOSIS — R293 Abnormal posture: Secondary | ICD-10-CM | POA: Diagnosis not present

## 2014-09-13 DIAGNOSIS — R293 Abnormal posture: Secondary | ICD-10-CM | POA: Diagnosis not present

## 2014-09-14 DIAGNOSIS — R293 Abnormal posture: Secondary | ICD-10-CM | POA: Diagnosis not present

## 2014-09-15 DIAGNOSIS — R293 Abnormal posture: Secondary | ICD-10-CM | POA: Diagnosis not present

## 2014-09-16 DIAGNOSIS — R293 Abnormal posture: Secondary | ICD-10-CM | POA: Diagnosis not present

## 2014-09-17 DIAGNOSIS — R293 Abnormal posture: Secondary | ICD-10-CM | POA: Diagnosis not present

## 2014-09-20 DIAGNOSIS — R293 Abnormal posture: Secondary | ICD-10-CM | POA: Diagnosis not present

## 2014-09-21 DIAGNOSIS — R293 Abnormal posture: Secondary | ICD-10-CM | POA: Diagnosis not present

## 2014-09-22 DIAGNOSIS — R293 Abnormal posture: Secondary | ICD-10-CM | POA: Diagnosis not present

## 2014-09-23 DIAGNOSIS — R293 Abnormal posture: Secondary | ICD-10-CM | POA: Diagnosis not present

## 2014-09-24 DIAGNOSIS — R293 Abnormal posture: Secondary | ICD-10-CM | POA: Diagnosis not present

## 2014-10-05 DIAGNOSIS — Z79899 Other long term (current) drug therapy: Secondary | ICD-10-CM | POA: Diagnosis not present

## 2014-10-05 DIAGNOSIS — E559 Vitamin D deficiency, unspecified: Secondary | ICD-10-CM | POA: Diagnosis not present

## 2014-10-11 DIAGNOSIS — Z23 Encounter for immunization: Secondary | ICD-10-CM | POA: Diagnosis not present

## 2014-10-20 DIAGNOSIS — I781 Nevus, non-neoplastic: Secondary | ICD-10-CM | POA: Diagnosis not present

## 2014-10-20 DIAGNOSIS — S72001A Fracture of unspecified part of neck of right femur, initial encounter for closed fracture: Secondary | ICD-10-CM | POA: Diagnosis not present

## 2014-10-25 DIAGNOSIS — K219 Gastro-esophageal reflux disease without esophagitis: Secondary | ICD-10-CM | POA: Diagnosis not present

## 2014-10-25 DIAGNOSIS — R400 Somnolence: Secondary | ICD-10-CM | POA: Diagnosis not present

## 2014-10-25 DIAGNOSIS — I781 Nevus, non-neoplastic: Secondary | ICD-10-CM | POA: Diagnosis not present

## 2014-10-26 DIAGNOSIS — F0631 Mood disorder due to known physiological condition with depressive features: Secondary | ICD-10-CM | POA: Diagnosis not present

## 2014-10-29 DIAGNOSIS — F0280 Dementia in other diseases classified elsewhere without behavioral disturbance: Secondary | ICD-10-CM | POA: Diagnosis not present

## 2014-10-29 DIAGNOSIS — N183 Chronic kidney disease, stage 3 (moderate): Secondary | ICD-10-CM | POA: Diagnosis not present

## 2014-10-29 DIAGNOSIS — G301 Alzheimer's disease with late onset: Secondary | ICD-10-CM | POA: Diagnosis not present

## 2014-10-29 DIAGNOSIS — K219 Gastro-esophageal reflux disease without esophagitis: Secondary | ICD-10-CM | POA: Diagnosis not present

## 2014-10-31 DIAGNOSIS — R0989 Other specified symptoms and signs involving the circulatory and respiratory systems: Secondary | ICD-10-CM | POA: Diagnosis not present

## 2014-11-01 DIAGNOSIS — R0989 Other specified symptoms and signs involving the circulatory and respiratory systems: Secondary | ICD-10-CM | POA: Diagnosis not present

## 2014-11-19 DIAGNOSIS — R634 Abnormal weight loss: Secondary | ICD-10-CM | POA: Diagnosis not present

## 2014-11-22 DIAGNOSIS — R634 Abnormal weight loss: Secondary | ICD-10-CM | POA: Diagnosis not present

## 2014-11-22 DIAGNOSIS — R400 Somnolence: Secondary | ICD-10-CM | POA: Diagnosis not present

## 2014-11-24 DIAGNOSIS — R1312 Dysphagia, oropharyngeal phase: Secondary | ICD-10-CM | POA: Diagnosis not present

## 2014-11-24 DIAGNOSIS — R293 Abnormal posture: Secondary | ICD-10-CM | POA: Diagnosis not present

## 2014-11-25 DIAGNOSIS — R293 Abnormal posture: Secondary | ICD-10-CM | POA: Diagnosis not present

## 2014-11-25 DIAGNOSIS — R1312 Dysphagia, oropharyngeal phase: Secondary | ICD-10-CM | POA: Diagnosis not present

## 2014-11-26 DIAGNOSIS — R1312 Dysphagia, oropharyngeal phase: Secondary | ICD-10-CM | POA: Diagnosis not present

## 2014-11-26 DIAGNOSIS — R293 Abnormal posture: Secondary | ICD-10-CM | POA: Diagnosis not present

## 2014-11-28 DIAGNOSIS — R1312 Dysphagia, oropharyngeal phase: Secondary | ICD-10-CM | POA: Diagnosis not present

## 2014-11-28 DIAGNOSIS — R293 Abnormal posture: Secondary | ICD-10-CM | POA: Diagnosis not present

## 2014-11-29 DIAGNOSIS — R293 Abnormal posture: Secondary | ICD-10-CM | POA: Diagnosis not present

## 2014-11-29 DIAGNOSIS — R1312 Dysphagia, oropharyngeal phase: Secondary | ICD-10-CM | POA: Diagnosis not present

## 2014-11-30 DIAGNOSIS — R293 Abnormal posture: Secondary | ICD-10-CM | POA: Diagnosis not present

## 2014-11-30 DIAGNOSIS — R1312 Dysphagia, oropharyngeal phase: Secondary | ICD-10-CM | POA: Diagnosis not present

## 2014-12-01 DIAGNOSIS — R293 Abnormal posture: Secondary | ICD-10-CM | POA: Diagnosis not present

## 2014-12-01 DIAGNOSIS — R1312 Dysphagia, oropharyngeal phase: Secondary | ICD-10-CM | POA: Diagnosis not present

## 2014-12-02 DIAGNOSIS — R1312 Dysphagia, oropharyngeal phase: Secondary | ICD-10-CM | POA: Diagnosis not present

## 2014-12-02 DIAGNOSIS — R293 Abnormal posture: Secondary | ICD-10-CM | POA: Diagnosis not present

## 2014-12-03 DIAGNOSIS — R293 Abnormal posture: Secondary | ICD-10-CM | POA: Diagnosis not present

## 2014-12-03 DIAGNOSIS — R1312 Dysphagia, oropharyngeal phase: Secondary | ICD-10-CM | POA: Diagnosis not present

## 2014-12-04 DIAGNOSIS — R1312 Dysphagia, oropharyngeal phase: Secondary | ICD-10-CM | POA: Diagnosis not present

## 2014-12-04 DIAGNOSIS — R293 Abnormal posture: Secondary | ICD-10-CM | POA: Diagnosis not present

## 2014-12-06 DIAGNOSIS — R1312 Dysphagia, oropharyngeal phase: Secondary | ICD-10-CM | POA: Diagnosis not present

## 2014-12-06 DIAGNOSIS — R293 Abnormal posture: Secondary | ICD-10-CM | POA: Diagnosis not present

## 2014-12-07 DIAGNOSIS — R1312 Dysphagia, oropharyngeal phase: Secondary | ICD-10-CM | POA: Diagnosis not present

## 2014-12-07 DIAGNOSIS — R293 Abnormal posture: Secondary | ICD-10-CM | POA: Diagnosis not present

## 2014-12-08 DIAGNOSIS — R293 Abnormal posture: Secondary | ICD-10-CM | POA: Diagnosis not present

## 2014-12-08 DIAGNOSIS — R1312 Dysphagia, oropharyngeal phase: Secondary | ICD-10-CM | POA: Diagnosis not present

## 2014-12-09 DIAGNOSIS — R1312 Dysphagia, oropharyngeal phase: Secondary | ICD-10-CM | POA: Diagnosis not present

## 2014-12-09 DIAGNOSIS — R293 Abnormal posture: Secondary | ICD-10-CM | POA: Diagnosis not present

## 2014-12-10 DIAGNOSIS — R293 Abnormal posture: Secondary | ICD-10-CM | POA: Diagnosis not present

## 2014-12-10 DIAGNOSIS — R1312 Dysphagia, oropharyngeal phase: Secondary | ICD-10-CM | POA: Diagnosis not present

## 2014-12-13 DIAGNOSIS — R1312 Dysphagia, oropharyngeal phase: Secondary | ICD-10-CM | POA: Diagnosis not present

## 2014-12-13 DIAGNOSIS — R293 Abnormal posture: Secondary | ICD-10-CM | POA: Diagnosis not present

## 2014-12-14 DIAGNOSIS — R1312 Dysphagia, oropharyngeal phase: Secondary | ICD-10-CM | POA: Diagnosis not present

## 2014-12-14 DIAGNOSIS — R293 Abnormal posture: Secondary | ICD-10-CM | POA: Diagnosis not present

## 2014-12-15 DIAGNOSIS — R293 Abnormal posture: Secondary | ICD-10-CM | POA: Diagnosis not present

## 2014-12-15 DIAGNOSIS — R1312 Dysphagia, oropharyngeal phase: Secondary | ICD-10-CM | POA: Diagnosis not present

## 2014-12-16 DIAGNOSIS — R1312 Dysphagia, oropharyngeal phase: Secondary | ICD-10-CM | POA: Diagnosis not present

## 2014-12-16 DIAGNOSIS — R293 Abnormal posture: Secondary | ICD-10-CM | POA: Diagnosis not present

## 2014-12-17 DIAGNOSIS — R293 Abnormal posture: Secondary | ICD-10-CM | POA: Diagnosis not present

## 2014-12-17 DIAGNOSIS — R1312 Dysphagia, oropharyngeal phase: Secondary | ICD-10-CM | POA: Diagnosis not present

## 2014-12-20 DIAGNOSIS — R293 Abnormal posture: Secondary | ICD-10-CM | POA: Diagnosis not present

## 2014-12-20 DIAGNOSIS — R1312 Dysphagia, oropharyngeal phase: Secondary | ICD-10-CM | POA: Diagnosis not present

## 2014-12-21 DIAGNOSIS — R1312 Dysphagia, oropharyngeal phase: Secondary | ICD-10-CM | POA: Diagnosis not present

## 2014-12-21 DIAGNOSIS — R293 Abnormal posture: Secondary | ICD-10-CM | POA: Diagnosis not present

## 2014-12-22 DIAGNOSIS — R293 Abnormal posture: Secondary | ICD-10-CM | POA: Diagnosis not present

## 2014-12-22 DIAGNOSIS — R1312 Dysphagia, oropharyngeal phase: Secondary | ICD-10-CM | POA: Diagnosis not present

## 2014-12-23 DIAGNOSIS — R1312 Dysphagia, oropharyngeal phase: Secondary | ICD-10-CM | POA: Diagnosis not present

## 2014-12-23 DIAGNOSIS — R293 Abnormal posture: Secondary | ICD-10-CM | POA: Diagnosis not present

## 2014-12-24 DIAGNOSIS — R1312 Dysphagia, oropharyngeal phase: Secondary | ICD-10-CM | POA: Diagnosis not present

## 2014-12-24 DIAGNOSIS — R293 Abnormal posture: Secondary | ICD-10-CM | POA: Diagnosis not present

## 2014-12-27 DIAGNOSIS — R1312 Dysphagia, oropharyngeal phase: Secondary | ICD-10-CM | POA: Diagnosis not present

## 2014-12-27 DIAGNOSIS — R293 Abnormal posture: Secondary | ICD-10-CM | POA: Diagnosis not present

## 2014-12-29 DIAGNOSIS — R634 Abnormal weight loss: Secondary | ICD-10-CM | POA: Diagnosis not present

## 2015-01-17 ENCOUNTER — Inpatient Hospital Stay
Admission: EM | Admit: 2015-01-17 | Discharge: 2015-02-09 | DRG: 870 | Disposition: E | Payer: Medicare Other | Attending: Internal Medicine | Admitting: Internal Medicine

## 2015-01-17 ENCOUNTER — Emergency Department: Payer: Medicare Other

## 2015-01-17 ENCOUNTER — Encounter: Payer: Self-pay | Admitting: Emergency Medicine

## 2015-01-17 DIAGNOSIS — Z1612 Extended spectrum beta lactamase (ESBL) resistance: Secondary | ICD-10-CM | POA: Diagnosis present

## 2015-01-17 DIAGNOSIS — L8990 Pressure ulcer of unspecified site, unspecified stage: Secondary | ICD-10-CM | POA: Diagnosis not present

## 2015-01-17 DIAGNOSIS — Z515 Encounter for palliative care: Secondary | ICD-10-CM | POA: Diagnosis present

## 2015-01-17 DIAGNOSIS — R4189 Other symptoms and signs involving cognitive functions and awareness: Secondary | ICD-10-CM

## 2015-01-17 DIAGNOSIS — R627 Adult failure to thrive: Secondary | ICD-10-CM | POA: Diagnosis present

## 2015-01-17 DIAGNOSIS — R630 Anorexia: Secondary | ICD-10-CM | POA: Diagnosis not present

## 2015-01-17 DIAGNOSIS — B9620 Unspecified Escherichia coli [E. coli] as the cause of diseases classified elsewhere: Secondary | ICD-10-CM | POA: Diagnosis present

## 2015-01-17 DIAGNOSIS — R0902 Hypoxemia: Secondary | ICD-10-CM | POA: Diagnosis not present

## 2015-01-17 DIAGNOSIS — Z9889 Other specified postprocedural states: Secondary | ICD-10-CM | POA: Diagnosis not present

## 2015-01-17 DIAGNOSIS — I6330 Cerebral infarction due to thrombosis of unspecified cerebral artery: Secondary | ICD-10-CM | POA: Diagnosis not present

## 2015-01-17 DIAGNOSIS — D72823 Leukemoid reaction: Secondary | ICD-10-CM | POA: Diagnosis present

## 2015-01-17 DIAGNOSIS — J9811 Atelectasis: Secondary | ICD-10-CM | POA: Diagnosis not present

## 2015-01-17 DIAGNOSIS — R1310 Dysphagia, unspecified: Secondary | ICD-10-CM | POA: Diagnosis present

## 2015-01-17 DIAGNOSIS — Z9911 Dependence on respirator [ventilator] status: Secondary | ICD-10-CM | POA: Diagnosis not present

## 2015-01-17 DIAGNOSIS — I447 Left bundle-branch block, unspecified: Secondary | ICD-10-CM | POA: Diagnosis present

## 2015-01-17 DIAGNOSIS — J9690 Respiratory failure, unspecified, unspecified whether with hypoxia or hypercapnia: Secondary | ICD-10-CM | POA: Diagnosis not present

## 2015-01-17 DIAGNOSIS — Z886 Allergy status to analgesic agent status: Secondary | ICD-10-CM

## 2015-01-17 DIAGNOSIS — E871 Hypo-osmolality and hyponatremia: Secondary | ICD-10-CM | POA: Diagnosis not present

## 2015-01-17 DIAGNOSIS — J690 Pneumonitis due to inhalation of food and vomit: Secondary | ICD-10-CM | POA: Diagnosis present

## 2015-01-17 DIAGNOSIS — Z888 Allergy status to other drugs, medicaments and biological substances status: Secondary | ICD-10-CM

## 2015-01-17 DIAGNOSIS — G309 Alzheimer's disease, unspecified: Secondary | ICD-10-CM | POA: Diagnosis present

## 2015-01-17 DIAGNOSIS — Z01818 Encounter for other preprocedural examination: Secondary | ICD-10-CM

## 2015-01-17 DIAGNOSIS — R509 Fever, unspecified: Secondary | ICD-10-CM | POA: Diagnosis not present

## 2015-01-17 DIAGNOSIS — K5900 Constipation, unspecified: Secondary | ICD-10-CM | POA: Diagnosis present

## 2015-01-17 DIAGNOSIS — R0602 Shortness of breath: Secondary | ICD-10-CM | POA: Diagnosis not present

## 2015-01-17 DIAGNOSIS — R4182 Altered mental status, unspecified: Secondary | ICD-10-CM | POA: Insufficient documentation

## 2015-01-17 DIAGNOSIS — E870 Hyperosmolality and hypernatremia: Secondary | ICD-10-CM | POA: Diagnosis present

## 2015-01-17 DIAGNOSIS — N179 Acute kidney failure, unspecified: Secondary | ICD-10-CM | POA: Diagnosis present

## 2015-01-17 DIAGNOSIS — R0600 Dyspnea, unspecified: Secondary | ICD-10-CM | POA: Diagnosis not present

## 2015-01-17 DIAGNOSIS — I639 Cerebral infarction, unspecified: Secondary | ICD-10-CM | POA: Diagnosis not present

## 2015-01-17 DIAGNOSIS — E860 Dehydration: Secondary | ICD-10-CM | POA: Diagnosis present

## 2015-01-17 DIAGNOSIS — D649 Anemia, unspecified: Secondary | ICD-10-CM | POA: Diagnosis present

## 2015-01-17 DIAGNOSIS — R269 Unspecified abnormalities of gait and mobility: Secondary | ICD-10-CM | POA: Diagnosis present

## 2015-01-17 DIAGNOSIS — Z66 Do not resuscitate: Secondary | ICD-10-CM | POA: Diagnosis not present

## 2015-01-17 DIAGNOSIS — Z6822 Body mass index (BMI) 22.0-22.9, adult: Secondary | ICD-10-CM | POA: Diagnosis not present

## 2015-01-17 DIAGNOSIS — Z87440 Personal history of urinary (tract) infections: Secondary | ICD-10-CM

## 2015-01-17 DIAGNOSIS — Z452 Encounter for adjustment and management of vascular access device: Secondary | ICD-10-CM | POA: Diagnosis not present

## 2015-01-17 DIAGNOSIS — Z90710 Acquired absence of both cervix and uterus: Secondary | ICD-10-CM | POA: Diagnosis not present

## 2015-01-17 DIAGNOSIS — N390 Urinary tract infection, site not specified: Secondary | ICD-10-CM | POA: Diagnosis not present

## 2015-01-17 DIAGNOSIS — Z4682 Encounter for fitting and adjustment of non-vascular catheter: Secondary | ICD-10-CM | POA: Diagnosis not present

## 2015-01-17 DIAGNOSIS — Z809 Family history of malignant neoplasm, unspecified: Secondary | ICD-10-CM

## 2015-01-17 DIAGNOSIS — E559 Vitamin D deficiency, unspecified: Secondary | ICD-10-CM | POA: Diagnosis present

## 2015-01-17 DIAGNOSIS — F0280 Dementia in other diseases classified elsewhere without behavioral disturbance: Secondary | ICD-10-CM | POA: Diagnosis present

## 2015-01-17 DIAGNOSIS — G9341 Metabolic encephalopathy: Secondary | ICD-10-CM | POA: Diagnosis not present

## 2015-01-17 DIAGNOSIS — E43 Unspecified severe protein-calorie malnutrition: Secondary | ICD-10-CM | POA: Insufficient documentation

## 2015-01-17 DIAGNOSIS — I6782 Cerebral ischemia: Secondary | ICD-10-CM | POA: Diagnosis not present

## 2015-01-17 DIAGNOSIS — D72829 Elevated white blood cell count, unspecified: Secondary | ICD-10-CM | POA: Diagnosis not present

## 2015-01-17 DIAGNOSIS — Z880 Allergy status to penicillin: Secondary | ICD-10-CM | POA: Diagnosis not present

## 2015-01-17 DIAGNOSIS — Z79899 Other long term (current) drug therapy: Secondary | ICD-10-CM

## 2015-01-17 DIAGNOSIS — J9600 Acute respiratory failure, unspecified whether with hypoxia or hypercapnia: Secondary | ICD-10-CM | POA: Insufficient documentation

## 2015-01-17 DIAGNOSIS — Z87891 Personal history of nicotine dependence: Secondary | ICD-10-CM | POA: Diagnosis not present

## 2015-01-17 DIAGNOSIS — Z95828 Presence of other vascular implants and grafts: Secondary | ICD-10-CM

## 2015-01-17 DIAGNOSIS — R9401 Abnormal electroencephalogram [EEG]: Secondary | ICD-10-CM | POA: Diagnosis present

## 2015-01-17 DIAGNOSIS — F0390 Unspecified dementia without behavioral disturbance: Secondary | ICD-10-CM | POA: Diagnosis not present

## 2015-01-17 DIAGNOSIS — Z8249 Family history of ischemic heart disease and other diseases of the circulatory system: Secondary | ICD-10-CM

## 2015-01-17 DIAGNOSIS — G931 Anoxic brain damage, not elsewhere classified: Secondary | ICD-10-CM | POA: Diagnosis present

## 2015-01-17 DIAGNOSIS — A419 Sepsis, unspecified organism: Principal | ICD-10-CM | POA: Diagnosis present

## 2015-01-17 DIAGNOSIS — J439 Emphysema, unspecified: Secondary | ICD-10-CM | POA: Diagnosis present

## 2015-01-17 DIAGNOSIS — J9602 Acute respiratory failure with hypercapnia: Secondary | ICD-10-CM | POA: Diagnosis not present

## 2015-01-17 DIAGNOSIS — I469 Cardiac arrest, cause unspecified: Secondary | ICD-10-CM | POA: Diagnosis not present

## 2015-01-17 DIAGNOSIS — R55 Syncope and collapse: Secondary | ICD-10-CM | POA: Diagnosis not present

## 2015-01-17 DIAGNOSIS — N3000 Acute cystitis without hematuria: Secondary | ICD-10-CM | POA: Diagnosis present

## 2015-01-17 DIAGNOSIS — Z9049 Acquired absence of other specified parts of digestive tract: Secondary | ICD-10-CM

## 2015-01-17 DIAGNOSIS — R739 Hyperglycemia, unspecified: Secondary | ICD-10-CM | POA: Diagnosis present

## 2015-01-17 DIAGNOSIS — F419 Anxiety disorder, unspecified: Secondary | ICD-10-CM | POA: Diagnosis present

## 2015-01-17 DIAGNOSIS — Z7982 Long term (current) use of aspirin: Secondary | ICD-10-CM

## 2015-01-17 DIAGNOSIS — J9601 Acute respiratory failure with hypoxia: Secondary | ICD-10-CM | POA: Diagnosis not present

## 2015-01-17 DIAGNOSIS — I959 Hypotension, unspecified: Secondary | ICD-10-CM | POA: Diagnosis not present

## 2015-01-17 DIAGNOSIS — I10 Essential (primary) hypertension: Secondary | ICD-10-CM | POA: Diagnosis present

## 2015-01-17 DIAGNOSIS — A4151 Sepsis due to Escherichia coli [E. coli]: Secondary | ICD-10-CM | POA: Diagnosis not present

## 2015-01-17 DIAGNOSIS — R400 Somnolence: Secondary | ICD-10-CM | POA: Diagnosis not present

## 2015-01-17 DIAGNOSIS — A498 Other bacterial infections of unspecified site: Secondary | ICD-10-CM | POA: Diagnosis not present

## 2015-01-17 DIAGNOSIS — J9621 Acute and chronic respiratory failure with hypoxia: Secondary | ICD-10-CM | POA: Diagnosis not present

## 2015-01-17 DIAGNOSIS — Z4659 Encounter for fitting and adjustment of other gastrointestinal appliance and device: Secondary | ICD-10-CM

## 2015-01-17 DIAGNOSIS — G9340 Encephalopathy, unspecified: Secondary | ICD-10-CM | POA: Diagnosis not present

## 2015-01-17 HISTORY — DX: Dementia in other diseases classified elsewhere, unspecified severity, without behavioral disturbance, psychotic disturbance, mood disturbance, and anxiety: F02.80

## 2015-01-17 HISTORY — DX: Alzheimer's disease, unspecified: G30.9

## 2015-01-17 LAB — BLOOD GAS, VENOUS
Acid-Base Excess: 2.3 mmol/L (ref 0.0–3.0)
BICARBONATE: 26.5 meq/L (ref 21.0–28.0)
PATIENT TEMPERATURE: 37
PCO2 VEN: 39 mmHg — AB (ref 44.0–60.0)
PH VEN: 7.44 — AB (ref 7.320–7.430)

## 2015-01-17 LAB — URINALYSIS COMPLETE WITH MICROSCOPIC (ARMC ONLY)
BILIRUBIN URINE: NEGATIVE
Glucose, UA: NEGATIVE mg/dL
NITRITE: POSITIVE — AB
PH: 5 (ref 5.0–8.0)
Protein, ur: 30 mg/dL — AB
SPECIFIC GRAVITY, URINE: 1.02 (ref 1.005–1.030)

## 2015-01-17 LAB — CBC WITH DIFFERENTIAL/PLATELET
BASOS ABS: 0.2 10*3/uL — AB (ref 0–0.1)
BASOS PCT: 2 %
EOS ABS: 0 10*3/uL (ref 0–0.7)
Eosinophils Relative: 0 %
HEMATOCRIT: 37 % (ref 35.0–47.0)
Hemoglobin: 11.5 g/dL — ABNORMAL LOW (ref 12.0–16.0)
Lymphocytes Relative: 17 %
Lymphs Abs: 2.4 10*3/uL (ref 1.0–3.6)
MCH: 26.5 pg (ref 26.0–34.0)
MCHC: 31.2 g/dL — ABNORMAL LOW (ref 32.0–36.0)
MCV: 85.1 fL (ref 80.0–100.0)
MONO ABS: 1.3 10*3/uL — AB (ref 0.2–0.9)
Monocytes Relative: 9 %
NEUTROS ABS: 10.1 10*3/uL — AB (ref 1.4–6.5)
Neutrophils Relative %: 72 %
PLATELETS: 545 10*3/uL — AB (ref 150–440)
RBC: 4.35 MIL/uL (ref 3.80–5.20)
RDW: 16.2 % — AB (ref 11.5–14.5)
WBC: 14.1 10*3/uL — ABNORMAL HIGH (ref 3.6–11.0)

## 2015-01-17 LAB — COMPREHENSIVE METABOLIC PANEL
ALBUMIN: 3.4 g/dL — AB (ref 3.5–5.0)
ALT: 11 U/L — AB (ref 14–54)
AST: 15 U/L (ref 15–41)
Alkaline Phosphatase: 93 U/L (ref 38–126)
Anion gap: 8 (ref 5–15)
BUN: 56 mg/dL — AB (ref 6–20)
CHLORIDE: 124 mmol/L — AB (ref 101–111)
CO2: 25 mmol/L (ref 22–32)
Calcium: 9.8 mg/dL (ref 8.9–10.3)
Creatinine, Ser: 1.31 mg/dL — ABNORMAL HIGH (ref 0.44–1.00)
GFR calc Af Amer: 40 mL/min — ABNORMAL LOW (ref >60)
GFR, EST NON AFRICAN AMERICAN: 34 mL/min — AB (ref >60)
GLUCOSE: 226 mg/dL — AB (ref 65–99)
POTASSIUM: 4 mmol/L (ref 3.5–5.1)
SODIUM: 157 mmol/L — AB (ref 135–145)
TOTAL PROTEIN: 7.1 g/dL (ref 6.5–8.1)
Total Bilirubin: 1.5 mg/dL — ABNORMAL HIGH (ref 0.3–1.2)

## 2015-01-17 LAB — TROPONIN I: TROPONIN I: 0.03 ng/mL (ref <0.031)

## 2015-01-17 LAB — LACTIC ACID, PLASMA: LACTIC ACID, VENOUS: 1.4 mmol/L (ref 0.5–2.0)

## 2015-01-17 MED ORDER — MORPHINE SULFATE (PF) 2 MG/ML IV SOLN
1.0000 mg | INTRAVENOUS | Status: DC | PRN
  Administered 2015-01-30 (×2): 1 mg via INTRAVENOUS
  Filled 2015-01-17 (×2): qty 1

## 2015-01-17 MED ORDER — LEVOFLOXACIN IN D5W 750 MG/150ML IV SOLN
750.0000 mg | Freq: Once | INTRAVENOUS | Status: DC
  Administered 2015-01-17: 750 mg via INTRAVENOUS
  Filled 2015-01-17: qty 150

## 2015-01-17 MED ORDER — CHLORHEXIDINE GLUCONATE 0.12 % MT SOLN
15.0000 mL | Freq: Two times a day (BID) | OROMUCOSAL | Status: DC
  Administered 2015-01-17 – 2015-01-22 (×7): 15 mL via OROMUCOSAL
  Filled 2015-01-17 (×6): qty 15

## 2015-01-17 MED ORDER — BISACODYL 10 MG RE SUPP: 10.0000 mg | Freq: Every day | RECTAL | Status: DC | PRN

## 2015-01-17 MED ORDER — DEXTROSE 5 % IV SOLN
1.0000 g | INTRAVENOUS | Status: DC
  Administered 2015-01-17 – 2015-01-18 (×2): 1 g via INTRAVENOUS
  Filled 2015-01-17 (×4): qty 10

## 2015-01-17 MED ORDER — VANCOMYCIN HCL IN DEXTROSE 1-5 GM/200ML-% IV SOLN
1000.0000 mg | Freq: Once | INTRAVENOUS | Status: DC
  Administered 2015-01-17: 1000 mg via INTRAVENOUS
  Filled 2015-01-17: qty 200

## 2015-01-17 MED ORDER — DEXTROSE 5 % IV SOLN
Freq: Once | INTRAVENOUS | Status: AC
  Administered 2015-01-17: 11:00:00 via INTRAVENOUS

## 2015-01-17 MED ORDER — POTASSIUM CL IN DEXTROSE 5% 20 MEQ/L IV SOLN
20.0000 meq | INTRAVENOUS | Status: DC
  Administered 2015-01-17 – 2015-01-18 (×2): 20 meq via INTRAVENOUS
  Filled 2015-01-17 (×3): qty 1000

## 2015-01-17 MED ORDER — SODIUM CHLORIDE 0.9 % IJ SOLN
3.0000 mL | Freq: Two times a day (BID) | INTRAMUSCULAR | Status: DC
  Administered 2015-01-17 – 2015-02-01 (×21): 3 mL via INTRAVENOUS

## 2015-01-17 MED ORDER — ENOXAPARIN SODIUM 30 MG/0.3ML ~~LOC~~ SOLN
30.0000 mg | SUBCUTANEOUS | Status: DC
  Administered 2015-01-17 – 2015-01-28 (×12): 30 mg via SUBCUTANEOUS
  Filled 2015-01-17 (×13): qty 0.3

## 2015-01-17 MED ORDER — AZTREONAM 2 G IJ SOLR
2.0000 g | Freq: Once | INTRAMUSCULAR | Status: AC
  Administered 2015-01-17: 2 g via INTRAVENOUS
  Filled 2015-01-17: qty 2

## 2015-01-17 MED ORDER — POTASSIUM CL IN DEXTROSE 5% 20 MEQ/L IV SOLN
20.0000 meq | INTRAVENOUS | Status: DC
  Administered 2015-01-17: 20 meq via INTRAVENOUS
  Filled 2015-01-17 (×2): qty 1000

## 2015-01-17 MED ORDER — ALBUTEROL SULFATE (2.5 MG/3ML) 0.083% IN NEBU: 2.5000 mg | INHALATION_SOLUTION | RESPIRATORY_TRACT | Status: DC | PRN

## 2015-01-17 MED ORDER — ONDANSETRON HCL 4 MG/2ML IJ SOLN: 4.0000 mg | Freq: Four times a day (QID) | INTRAMUSCULAR | Status: DC | PRN

## 2015-01-17 MED ORDER — SODIUM CHLORIDE 0.9 % IV BOLUS (SEPSIS)
1000.0000 mL | Freq: Once | INTRAVENOUS | Status: AC
  Administered 2015-01-17: 1000 mL via INTRAVENOUS

## 2015-01-17 MED ORDER — ONDANSETRON HCL 4 MG PO TABS: 4.0000 mg | ORAL_TABLET | Freq: Four times a day (QID) | ORAL | Status: DC | PRN

## 2015-01-17 MED ORDER — FLEET ENEMA 7-19 GM/118ML RE ENEM: 1.0000 | ENEMA | Freq: Once | RECTAL | Status: DC | PRN

## 2015-01-17 MED ORDER — HYDRALAZINE HCL 20 MG/ML IJ SOLN: 10.0000 mg | Freq: Four times a day (QID) | INTRAMUSCULAR | Status: DC | PRN

## 2015-01-17 MED ORDER — SODIUM CHLORIDE 0.9 % IV BOLUS (SEPSIS): 500.0000 mL | INTRAVENOUS | Status: AC

## 2015-01-17 NOTE — H&P (Signed)
[facility] Physicians - [place] at [facility]   PATIENT NAME: Christine Vance    MR#:  [id number]  DATE OF BIRTH:  09-Jul-1920  DATE OF ADMISSION:  February 11, 2015  PRIMARY CARE PHYSICIAN: Keane Police, MD   REQUESTING/REFERRING PHYSICIAN: Dr. Mayford Knife  CHIEF COMPLAINT:   Chief Complaint  Patient presents with  . unresponsive    unresponsive    HISTORY OF PRESENT ILLNESS:  Christine Vance  is a 82 y.o. female with a known history of dementia, malnutrition and dysphagia sent from NH due to encephalopathy and fever. Found to have UTI. Patient opens eyes and moans at times. History obtained from old records and ED. Daughter confirmed with Dr. Mayford Knife that patient is a full code.  PAST MEDICAL HISTORY:   Past Medical History  Diagnosis Date  . Edema leg   . Constipation   . LBBB (left bundle branch block)   . Allergic rhinitis   . Anxiety     PAST SURGICAL HISTORY:   Past Surgical History  Procedure Laterality Date  . Cholecystectomy  1960's  . Hemorrhoid surgery  late 1950's  . Other surgery  1960's    hysterectomy    SOCIAL HISTORY:   Social History  Substance Use Topics  . Smoking status: Former Games developer  . Smokeless tobacco: Not on file     Comment: very brief  . Alcohol Use: No    FAMILY HISTORY:   Family History  Problem Relation Age of Onset  . Cancer Mother     colon  . Heart disease Father     MI  . Cancer Maternal Aunt     lung  . Diabetes Neg Hx   . Hypertension Neg Hx   . Coronary artery disease Neg Hx     DRUG ALLERGIES:   Allergies  Allergen Reactions  . Codeine Sulfate Other (See Comments)    unknown  . Ivp Dye [Iodinated Diagnostic Agents] Other (See Comments)    unknown  . Lansoprazole Other (See Comments)    unknown  . Losartan Potassium-Hctz Other (See Comments)    unknown  . Penicillins Other (See Comments)    unknown  . Pravachol [Pravastatin] Other (See Comments)    unknown  . Prednisone Other  (See Comments)    unknown  . Sulfa Antibiotics Other (See Comments)    unknown  . Sulfonamide Derivatives Other (See Comments)    unknown    REVIEW OF SYSTEMS:   Review of Systems  Unable to perform ROS: mental status change    MEDICATIONS AT HOME:   Prior to Admission medications   Medication Sig Start Date End Date Taking? Authorizing Provider  acetaminophen (TYLENOL) 325 MG tablet Take 650 mg by mouth 2 (two) times daily as needed.   Yes Historical Provider, MD  aspirin 81 MG chewable tablet Chew 81 mg by mouth daily.   Yes Historical Provider, MD  cholecalciferol (VITAMIN D) 1000 units tablet Take 2,000 Units by mouth daily.   Yes Historical Provider, MD  polyethylene glycol powder (MIRALAX) powder Take 17 g by mouth daily.   Yes Historical Provider, MD  ranitidine (ZANTAC) 150 MG tablet Take 150 mg by mouth daily.   Yes Historical Provider, MD  senna-docusate (SENOKOT-S) 8.6-50 MG tablet Take 2 tablets by mouth 2 (two) times daily.   Yes Historical Provider, MD      VITAL SIGNS:  Blood pressure 174/81, pulse 92, temperature 100.9 F (38.3 C), temperature source Rectal, resp. rate 17, SpO2 100 %.  PHYSICAL EXAMINATION:  Physical Exam  GENERAL:  82 y.o.-year-old patient lying in the bed with no acute distress. Drowzy. Opens eyes EYES: Pupils equal, round, reactive to light and accommodation. No scleral icterus. Extraocular muscles intact.  HEENT: Head atraumatic, normocephalic. Oropharynx and nasopharynx clear. No oropharyngeal erythema, moist oral mucosa  NECK:  Supple, no jugular venous distention. No thyroid enlargement, no tenderness.  LUNGS: Normal breath sounds bilaterally, no wheezing, rales, rhonchi. No use of accessory muscles of respiration.  CARDIOVASCULAR: S1, S2 normal. No murmurs, rubs, or gallops.  ABDOMEN: Soft, nontender, nondistended. Bowel sounds present. No organomegaly or mass.  EXTREMITIES: No pedal edema, cyanosis, or clubbing. + 2 pedal & radial  pulses b/l.  Diffuse muscle wasting NEUROLOGIC: Moves extremities to pain PSYCHIATRIC: The patient is drowzy.  LABORATORY PANEL:   CBC  Recent Labs Lab 01/27/2015 0920  WBC 14.1*  HGB 11.5*  HCT 37.0  PLT 545*   ------------------------------------------------------------------------------------------------------------------  Chemistries   Recent Labs Lab 01/23/2015 0920  NA 157*  K 4.0  CL 124*  CO2 25  GLUCOSE 226*  BUN 56*  CREATININE 1.31*  CALCIUM 9.8  AST 15  ALT 11*  ALKPHOS 93  BILITOT 1.5*   ------------------------------------------------------------------------------------------------------------------  Cardiac Enzymes  Recent Labs Lab 01/31/2015 0920  TROPONINI 0.03   ------------------------------------------------------------------------------------------------------------------  RADIOLOGY:  Ct Head Wo Contrast  02/07/2015  CLINICAL DATA:  Found unresponsive this morning. EXAM: CT HEAD WITHOUT CONTRAST TECHNIQUE: Contiguous axial images were obtained from the base of the skull through the vertex without intravenous contrast. COMPARISON:  02/11/2014 FINDINGS: Stable age related cerebral atrophy, ventriculomegaly and periventricular white matter disease. No extra-axial fluid collections are identified. No CT findings for acute hemispheric infarction or intracranial hemorrhage. No mass lesions. The brainstem and cerebellum are normal. The bony structures are intact. No acute skull fracture. The paranasal sinuses and mastoid air cells are clear. The globes are intact. IMPRESSION: Stable age related cerebral atrophy, ventriculomegaly and periventricular white matter disease. No acute intracranial findings or skull fracture. Electronically Signed   By: Rudie MeyerP.  Gallerani M.D.   On: 01/26/2015 10:06   Dg Chest Port 1 View  01/30/2015  CLINICAL DATA:  Fever, found unresponsive this morning EXAM: PORTABLE CHEST 1 VIEW COMPARISON:  04/21/2014 FINDINGS: Cardiomediastinal  silhouette is stable. No acute infiltrate or pleural effusion. No pulmonary edema. Atherosclerotic calcifications of thoracic aorta again noted. Diffuse osteopenia again noted. IMPRESSION: No active disease.  No significant change. Electronically Signed   By: Natasha MeadLiviu  Pop M.D.   On: 01/18/2015 09:46     IMPRESSION AND PLAN:   * UTI with sepsis an acute encephalopathy Start aztreonam due to PCN allergy. Unknown if this is rash/anaphylaxis. Await cx results  * Hypernatremia with severe dehydration Start D5. Repeat labs. replace K as needed. Pt [facility] Honey thick[facility]  * Severe malnutrition NPO for now. Start diet once more awake. May need PEG if poor intake.  Consult palliative care  * ARF Fannie KneeSue to dehydration Start IVF I/OS  * DVT prophylaxis Lovenox  Critically ill   All the records are reviewed and case discussed with ED provider. Management plans discussed with the patient, family and they are in agreement.  CODE STATUS: FULL  TOTAL CC TIME TAKING CARE OF THIS PATIENT: 40 minutes.    Milagros LollSudini, Kaitelyn Jamison R M.D on 01/16/2015 at 10:58 AM  Between 7am to 6pm - Pager - [phone]  After 6pm go to [email or web address] - password [email or web address] [facility]  [facility]  [place] [facility]  Office  [phone]  CC: Primary care physician; Keane Police, MD     Note: This dictation was prepared with Dragon dictation along with smaller phrase technology. Any transcriptional errors that result from this process are unintentional.

## 2015-01-17 NOTE — ED Notes (Signed)
Patient transported to CT 

## 2015-01-17 NOTE — Plan of Care (Signed)
Problem: Safety: Goal: Ability to remain free from injury will improve Outcome: Progressing High fall risk. Patient has bed alarm on.  Problem: Skin Integrity: Goal: Risk for impaired skin integrity will decrease Outcome: Progressing q2h turns done. Patient remains responsive to pain.  Problem: Fluid Volume: Goal: Ability to maintain a balanced intake and output will improve Outcome: Progressing IVF infusing   Problem: Nutrition: Goal: Adequate nutrition will be maintained Outcome: Progressing NPO right now. Patient is not alert enough to handle PO. MD aware.  Foley in place for strict I&O, keep in per Dr. Elpidio AnisSudini

## 2015-01-17 NOTE — ED Notes (Signed)
Pt laying supine

## 2015-01-17 NOTE — Progress Notes (Signed)
Spoke with pharmacy. Patient has tolerated cephalosporins - Keflex in the past. Will d/c aztreonam and start ceftriaxone.

## 2015-01-17 NOTE — ED Notes (Signed)
Pt laying right side

## 2015-01-17 NOTE — ED Notes (Signed)
Foley emptied prior to transport. Pt clean at this time;

## 2015-01-17 NOTE — ED Notes (Signed)
Pt found unresponsive at 7 am. From [facility]. At baseline pt talkative and alert.

## 2015-01-17 NOTE — ED Notes (Signed)
Pt laying left side

## 2015-01-17 NOTE — ED Provider Notes (Addendum)
[facility] Emergency Department Provider Note     Time seen: ----------------------------------------- 9:19 AM on 02/05/2015 -----------------------------------------  Level V caveat: Review of systems and history cannot be obtained due to altered mental status  I have reviewed the triage vital signs and the nursing notes.   HISTORY  Chief Complaint No chief complaint on file.    HPI Christine Vance is a 82 y.o. female [facility] Patient is talkative and alert at baseline. Here she is febrile with altered mental status. No further information or history is available.   Past Medical History  Diagnosis Date  . Edema leg   . Constipation   . LBBB (left bundle branch block)   . Allergic rhinitis   . Anxiety     Patient Active Problem List   Diagnosis Date Noted  . Medicare annual wellness visit, initial 02/11/2014  . Osteoporosis 02/11/2014  . Dementia 02/11/2014  . Depression 02/11/2014  . Compression fracture 02/11/2014  . Weakness 12/20/2010  . Hypertension 08/01/2010  . ANXIETY 09/21/2008  . ALLERGIC RHINITIS 09/26/2007  . CONSTIPATION, CHRONIC 09/26/2007    Past Surgical History  Procedure Laterality Date  . Cholecystectomy  1960's  . Hemorrhoid surgery  late 1950's  . Other surgery  1960's    hysterectomy    Allergies Codeine sulfate; Lansoprazole; Losartan potassium-hctz; Penicillins; Prednisone; and Sulfonamide derivatives  Social History Social History  Substance Use Topics  . Smoking status: Former Games developermoker  . Smokeless tobacco: Not on file     Comment: very brief  . Alcohol Use: No    Review of Systems  10-point ROS is unknown at this time  ____________________________________________   PHYSICAL EXAM:  VITAL SIGNS: ED Triage Vitals  Enc Vitals Group     BP --      Pulse --      Resp --      Temp --      Temp src --      SpO2 --      Weight --      Height --    Head Cir --      Peak Flow --      Pain Score --      Pain Loc --      Pain Edu? --      Excl. in GC? --     Constitutional: Patient is poorly responsive, moderate distress Eyes: Conjunctivae are normal. PERRL. Normal extraocular movements. ENT   Head: Normocephalic and atraumatic.   Nose: No congestion/rhinnorhea.   Mouth/Throat: Mucous membranes are moist. Patient appears to have a gag reflex   Neck: No stridor. Cardiovascular: Normal rate, regular rhythm. Normal and symmetric distal pulses are present in all extremities. No murmurs, rubs, or gallops. Respiratory: Normal respiratory effort without tachypnea nor retractions. Breath sounds are clear and equal bilaterally. No wheezes/rales/rhonchi. Gastrointestinal: Soft and nontender. No distention. No abdominal bruits.  Musculoskeletal: Nontender with normal range of motion in all extremities. No edema Neurologic:  Patient seems to track with her eyes, localizes to pain. She has been nonverbal to this point. Skin:  Skin is warm, dry and intact. No rash noted. ____________________________________________  EKG: Interpreted by me. Normal sinus rhythm with rate 86 bpm, normal PR interval, wide QRS width, long QT interval. Left bundle branch block  ____________________________________________  ED COURSE:  Pertinent labs & imaging results that were available during my care of the patient were reviewed by me and considered  in my medical decision making (see chart for details). Patient is likely septic, will initiate sepsis protocol. ____________________________________________    LABS (pertinent positives/negatives)  Labs Reviewed  COMPREHENSIVE METABOLIC PANEL - Abnormal; Notable for the following:    Sodium 157 (*)    Chloride 124 (*)    Glucose, Bld 226 (*)    BUN 56 (*)    Creatinine, Ser 1.31 (*)    Albumin 3.4 (*)    ALT 11 (*)    Total Bilirubin 1.5 (*)    GFR calc non Af Amer 34 (*)    GFR calc Af Amer 40  (*)    All other components within normal limits  CBC WITH DIFFERENTIAL/PLATELET - Abnormal; Notable for the following:    WBC 14.1 (*)    Hemoglobin 11.5 (*)    MCHC 31.2 (*)    RDW 16.2 (*)    Platelets 545 (*)    Neutro Abs 10.1 (*)    Monocytes Absolute 1.3 (*)    Basophils Absolute 0.2 (*)    All other components within normal limits  URINALYSIS COMPLETEWITH MICROSCOPIC ([facility] ONLY) - Abnormal; Notable for the following:    Color, Urine YELLOW (*)    APPearance CLOUDY (*)    Ketones, ur TRACE (*)    Hgb urine dipstick 1+ (*)    Protein, ur 30 (*)    Nitrite POSITIVE (*)    Leukocytes, UA 1+ (*)    Bacteria, UA MANY (*)    Squamous Epithelial / LPF 0-5 (*)    All other components within normal limits  BLOOD GAS, VENOUS - Abnormal; Notable for the following:    pH, Ven 7.44 (*)    pCO2, Ven 39 (*)    All other components within normal limits  CULTURE, BLOOD (ROUTINE X 2)  CULTURE, BLOOD (ROUTINE X 2)  URINE CULTURE  LACTIC ACID, PLASMA  TROPONIN I  LACTIC ACID, PLASMA    RADIOLOGY Images were viewed by me  Chest x-ray, CT head  CRITICAL CARE Performed by: Emily Filbert   Total critical care time: 30 minutes  Critical care time was exclusive of separately billable procedures and treating other patients.  Critical care was necessary to treat or prevent imminent or life-threatening deterioration.  Critical care was time spent personally by me on the following activities: development of treatment plan with patient and/or surrogate as well as nursing, discussions with consultants, evaluation of patient's response to treatment, examination of patient, obtaining history from patient or surrogate, ordering and performing treatments and interventions, ordering and review of laboratory studies, ordering and review of radiographic studies, pulse oximetry and re-evaluation of patient's condition.  ____________________________________________  FINAL ASSESSMENT AND  PLAN  Fever, UTI, altered mental status, leukocytosis, hypernatremia and free water deficit  Plan: Patient with labs and imaging as dictated above. Patient likely with early urosepsis. She's been started on broad-spectrum antibiotics for pen allergic patients. I have started her on D5W for approximate 4.5 L free water deficit as well. Palliative care has been consult to due to her multiple critical issues. Patient remains in critical condition at this time.   Emily Filbert, MD   Emily Filbert, MD 02-01-2015 1013  Emily Filbert, MD 2015/02/01 [phone]

## 2015-01-18 LAB — CBC
HCT: 31.6 % — ABNORMAL LOW (ref 35.0–47.0)
Hemoglobin: 9.9 g/dL — ABNORMAL LOW (ref 12.0–16.0)
MCH: 27.5 pg (ref 26.0–34.0)
MCHC: 31.4 g/dL — ABNORMAL LOW (ref 32.0–36.0)
MCV: 87.5 fL (ref 80.0–100.0)
PLATELETS: 356 10*3/uL (ref 150–440)
RBC: 3.61 MIL/uL — ABNORMAL LOW (ref 3.80–5.20)
RDW: 16.5 % — AB (ref 11.5–14.5)
WBC: 10.4 10*3/uL (ref 3.6–11.0)

## 2015-01-18 LAB — BLOOD GAS, ARTERIAL
ACID-BASE DEFICIT: 0 mmol/L (ref 0.0–2.0)
Allens test (pass/fail): POSITIVE — AB
BICARBONATE: 23.6 meq/L (ref 21.0–28.0)
FIO2: 24
O2 SAT: 97 %
PCO2 ART: 34 mmHg (ref 32.0–48.0)
PO2 ART: 95 mmHg (ref 83.0–108.0)
Patient temperature: 37
pH, Arterial: 7.45 (ref 7.350–7.450)

## 2015-01-18 LAB — BASIC METABOLIC PANEL
Anion gap: 5 (ref 5–15)
BUN: 37 mg/dL — AB (ref 6–20)
CHLORIDE: 118 mmol/L — AB (ref 101–111)
CO2: 25 mmol/L (ref 22–32)
CREATININE: 0.92 mg/dL (ref 0.44–1.00)
Calcium: 8.6 mg/dL — ABNORMAL LOW (ref 8.9–10.3)
GFR calc Af Amer: >60 mL/min (ref >60)
GFR calc non Af Amer: 52 mL/min — ABNORMAL LOW (ref >60)
GLUCOSE: 249 mg/dL — AB (ref 65–99)
POTASSIUM: 4 mmol/L (ref 3.5–5.1)
Sodium: 148 mmol/L — ABNORMAL HIGH (ref 135–145)

## 2015-01-18 MED ORDER — SODIUM CHLORIDE 0.45 % IV SOLN
INTRAVENOUS | Status: DC
  Administered 2015-01-18 – 2015-01-20 (×6): via INTRAVENOUS

## 2015-01-18 NOTE — Plan of Care (Signed)
Pt was very lethargic today - so much so, ABGs were done - which were normal.  Tomorrow she will have a swallow eval.  Palliative Care consult called, but daughter in law who is POA wants all measures taken. Pt was nonverbal and nonresponsive this am - but became more verbal.

## 2015-01-18 NOTE — Progress Notes (Signed)
[facility] - [place] at [facility]   PATIENT NAME: Christine HeapLorraine Dishon    MR#:  [id number]  DATE OF BIRTH:  05/05/1920  SUBJECTIVE:  CHIEF COMPLAINT:   Chief Complaint  Patient presents with  . Urinary Tract Infection    unresponsive   Sent from NH- found with UTI, malnourished, and dehydrated.   Remains partially drowsy/ sleepy- opens eyes, but disoriented.  REVIEW OF SYSTEMS:   Not able to provide ROS due to drowsiness/ disorientation. ROS  DRUG ALLERGIES:   Allergies  Allergen Reactions  . Codeine Sulfate Other (See Comments)    unknown  . Ivp Dye [Iodinated Diagnostic Agents] Other (See Comments)    unknown  . Lansoprazole Other (See Comments)    unknown  . Losartan Potassium-Hctz Other (See Comments)    unknown  . Penicillins Other (See Comments)    unknown  . Pravachol [Pravastatin] Other (See Comments)    unknown  . Prednisone Other (See Comments)    unknown  . Sulfa Antibiotics Other (See Comments)    unknown  . Sulfonamide Derivatives Other (See Comments)    unknown    VITALS:  Blood pressure 148/59, pulse 86, temperature 98.1 F (36.7 C), temperature source Oral, resp. rate 20, height 4\' 8"  (1.422 m), weight 41.549 kg (91 lb 9.6 oz), SpO2 100 %.  PHYSICAL EXAMINATION:   GENERAL: 82 y.o.-year-old patient lying in the bed with no acute distress. Drowzy. Opens eyes EYES: Pupils equal, round, reactive to light. No scleral icterus. Extraocular muscles intact.  HEENT: Head atraumatic, normocephalic. Oropharynx and nasopharynx clear. No oropharyngeal erythema, dry oral mucosa  NECK: Supple, no jugular venous distention. No thyroid enlargement, no tenderness.  LUNGS: Normal breath sounds bilaterally, no wheezing, rales, rhonchi. No use of accessory muscles of respiration.  CARDIOVASCULAR: S1, S2 normal. No murmurs, rubs, or gallops.  ABDOMEN: Soft, nontender, nondistended. Bowel sounds present. No organomegaly or mass.   EXTREMITIES: No pedal edema, cyanosis, or clubbing. + 2 pedal & radial pulses b/l. Diffuse muscle wasting NEUROLOGIC: Moves extremities to pain PSYCHIATRIC: The patient is drowzy.  Physical Exam LABORATORY PANEL:   CBC  Recent Labs Lab 01/18/15 0451  WBC 10.4  HGB 9.9*  HCT 31.6*  PLT 356   ------------------------------------------------------------------------------------------------------------------  Chemistries   Recent Labs Lab 2015/01/16 0920 01/18/15 0451  NA 157* 148*  K 4.0 4.0  CL 124* 118*  CO2 25 25  GLUCOSE 226* 249*  BUN 56* 37*  CREATININE 1.31* 0.92  CALCIUM 9.8 8.6*  AST 15  --   ALT 11*  --   ALKPHOS 93  --   BILITOT 1.5*  --    ------------------------------------------------------------------------------------------------------------------  Cardiac Enzymes  Recent Labs Lab 2015/01/16 0920  TROPONINI 0.03   ------------------------------------------------------------------------------------------------------------------  RADIOLOGY:  Ct Head Wo Contrast  01/31/2015  CLINICAL DATA:  Found unresponsive this morning. EXAM: CT HEAD WITHOUT CONTRAST TECHNIQUE: Contiguous axial images were obtained from the base of the skull through the vertex without intravenous contrast. COMPARISON:  02/11/2014 FINDINGS: Stable age related cerebral atrophy, ventriculomegaly and periventricular white matter disease. No extra-axial fluid collections are identified. No CT findings for acute hemispheric infarction or intracranial hemorrhage. No mass lesions. The brainstem and cerebellum are normal. The bony structures are intact. No acute skull fracture. The paranasal sinuses and mastoid air cells are clear. The globes are intact. IMPRESSION: Stable age related cerebral atrophy, ventriculomegaly and periventricular white matter disease. No acute intracranial findings or skull fracture. Electronically Signed   By: Demetrius CharityP.  Gallerani M.D.   On: 2015/01/26 10:06   Dg Chest Port  1 View  January 26, 2015  CLINICAL DATA:  Fever, found unresponsive this morning EXAM: PORTABLE CHEST 1 VIEW COMPARISON:  04/21/2014 FINDINGS: Cardiomediastinal silhouette is stable. No acute infiltrate or pleural effusion. No pulmonary edema. Atherosclerotic calcifications of thoracic aorta again noted. Diffuse osteopenia again noted. IMPRESSION: No active disease.  No significant change. Electronically Signed   By: Natasha Mead M.D.   On: 26-Jan-2015 09:46    ASSESSMENT AND PLAN:   Active Problems:   UTI (lower urinary tract infection)  * UTI with sepsis an acute encephalopathy On rocephin. Await cx results  * Hypernatremia with severe dehydration IV fluids. Repeat labs- some correction. replace K as needed. Pt is on Honey thickened liquids due to dysphagia at NH.  will get SLP eval.  * Severe malnutrition NPO for now. Start diet once more awake. May need PEG if poor intake.  Consulted palliative care as overall very poor prognosis.  * ARF due to dehydration on IVF I/OS  * DVT prophylaxis Lovenox  All the records are reviewed and case discussed with Care Management/Social Workerr. Management plans discussed with the patient, family and they are in agreement.  CODE STATUS: FUll  TOTAL TIME TAKING CARE OF THIS PATIENT: 35 minutes.  Tried both the numbers available today in chart for her daughter- no response- left a voice msg for calling back.   POSSIBLE D/C IN 2-3 DAYS, DEPENDING ON CLINICAL CONDITION.   Altamese Dilling M.D on 01/18/2015   Between 7am to 6pm - Pager - [phone]  After 6pm go to [email or web address] - password [email or web address] [facility]  [place] [facility]  Office  [phone]  CC: Primary care physician; Keane Police, MD  Note: This dictation was prepared with Dragon dictation along with smaller phrase technology. Any transcriptional errors that result from this process are unintentional.

## 2015-01-18 NOTE — Plan of Care (Signed)
Problem: Education: Goal: Knowledge of [facility] Education information/materials will improve Outcome: Not Progressing Pt unresponsive  Problem: Safety: Goal: Ability to remain free from injury will improve Outcome: Progressing Turn q 2 and using pillows to elevate heels, between knees.  Bed alarm and frequent rounds for safety.  Problem: Skin Integrity: Goal: Risk for impaired skin integrity will decrease Outcome: Progressing Pt is kept clean and dry and turned every 2 hours.  Problem: Fluid Volume: Goal: Ability to maintain a balanced intake and output will improve Outcome: Not Progressing IV fluids currently with NPO until more alert.  Problem: Nutrition: Goal: Adequate nutrition will be maintained Outcome: Not Progressing Pt not alert enough for food or fluids.  Pt with baseline dysphagia and requires thickened liquids when able to take po.

## 2015-01-18 NOTE — Progress Notes (Signed)
I called daughter in law- the number in chart- she called back-  Pt is gradually going down hill in health and eating for 6 months.  I explained about this UTI, malnutrition and Dehydration. There might be a question of feeding also , if she recovers.  i also asked about code status.  She will think about it and let us know.  I also informed about the option of hospice nurse at NH, and she looks interested in that- But NOT in sending pt to hospice facility.

## 2015-01-18 NOTE — Progress Notes (Signed)
Inpatient Diabetes Program Recommendations  AACE/ADA: New Consensus Statement on Inpatient Glycemic Control (2015)  Target Ranges:  Prepandial:   less than 140 mg/dL      Peak postprandial:   less than 180 mg/dL (1-2 hours)      Critically ill patients:  140 - 180 mg/dL  Results for Marijean HeathPPLE, Arnita M (MRN [id number]) as of 01/18/2015 12:18  Ref. Range 01/13/2015 09:20 01/18/2015 04:51  Glucose Latest Ref Range: 65-99 mg/dL 409226 (H) 811249 (H)   Review of Glycemic Control  Diabetes history: No Outpatient Diabetes medications: NA Current orders for Inpatient glycemic control: None  Inpatient Diabetes Program Recommendations: Correction (SSI): Patient has no documented history of diabetes and in reviewing the chart did not note any steroids being given since admitted. Initial lab glucose was 226 m/gdl and fasting lab glucose was 249 mg/dl today. Please order CBGs with Novolog sensitive correction Q4H while inpatient.  HgbA1C: Please order an A1C to evaluate glycemic control over the past 2-3 months.  Thanks, Orlando PennerMarie Kaeli Nichelson, RN, MSN, CDE Diabetes Coordinator Inpatient Diabetes Program [phone] (Team Pager from 8am to 5pm) [phone] ([facility] office) [phone] [facility] office) [phone] [facility] office)

## 2015-01-18 NOTE — Progress Notes (Signed)
Pt is in atrial fibrillation, new rhythm. Pt on Lopressor. MD notified, continue to monitor heart rate.

## 2015-01-18 NOTE — Clinical Social Work Note (Signed)
Clinical Social Worker was consulted as pt was admitted from [facility]. CSW left a message for the facility requesting a return phone call. CSW also attempted to reach pt's daughter, Sheral FlowSheree Millhouse, and left messages (on listed home and cell numbers) requesting a return phone call. Full assessment to follow. CSW will continue to follow.   Dede QuerySarah Fabiha Rougeau, MSW, LCSW Clinical Social Worker [phone]

## 2015-01-18 NOTE — Progress Notes (Signed)
Initial Nutrition Assessment  DOCUMENTATION CODES:    RD notes severe malnutrition listed as active problem in H&P MD note  INTERVENTION:   Coordination of Care: will follow poc as pt currently NPO not awake enough to take po. RD notes PTA pt being on honey thickened liquids at baseline.   NUTRITION DIAGNOSIS:   Inadequate oral intake related to inability to eat as evidenced by NPO status.  GOAL:   Patient will meet greater than or equal to 90% of their needs  MONITOR:    (Energy Intake, Anthropometrics, Electrolyte and rneal Profile, Digestive System, Glucose Profile)  REASON FOR ASSESSMENT:   Consult Poor PO  ASSESSMENT:   Pt admitted with AMS and UTI. Pt with h/o dementia, dysphagia and was on honey thickened liquids PTA. Palliative Care consulted.  Past Medical History  Diagnosis Date  . Edema leg   . Constipation   . LBBB (left bundle branch block)   . Allergic rhinitis   . Anxiety   . Alzheimer's dementia     Diet Order:  Diet NPO time specified    Current Nutrition: Pt NPO  Food/Nutrition-Related History: Unable to clarify with pt as pt lethargic on visit unable to awaken. Per MST pt with decreased appetite PTA. No family present.    Scheduled Medications:  . cefTRIAXone (ROCEPHIN)  IV  1 g Intravenous Q24H  . chlorhexidine  15 mL Mouth Rinse BID  . enoxaparin (LOVENOX) injection  30 mg Subcutaneous Q24H  . sodium chloride  3 mL Intravenous Q12H    Continuous Medications:  . sodium chloride 100 mL/hr at 01/18/15 0958  . dextrose 5 % with KCl 20 mEq / L 20 mEq (01/18/15 0816)     Electrolyte/Renal Profile and Glucose Profile:   Recent Labs Lab 01/23/2015 0920 01/18/15 0451  NA 157* 148*  K 4.0 4.0  CL 124* 118*  CO2 25 25  BUN 56* 37*  CREATININE 1.31* 0.92  CALCIUM 9.8 8.6*  GLUCOSE 226* 249*   Protein Profile:  Recent Labs Lab 01/24/2015 0920  ALBUMIN 3.4*    Gastrointestinal Profile: Last BM:  unknown   Nutrition-Focused  Physical Exam Findings:  Unable to complete Nutrition-Focused physical exam at this time.   Weight Change: Per [facility] encounters pt with 13% weight loss over the past 11 months.   Height:   Ht Readings from Last 1 Encounters:  01/30/2015 4\' 8"  (1.422 m)    Weight:   Wt Readings from Last 1 Encounters:  01/18/15 91 lb 9.6 oz (41.549 kg)    Wt Readings from Last 10 Encounters:  01/18/15 91 lb 9.6 oz (41.549 kg)  02/11/14 105 lb 12 oz (47.968 kg)  01/07/12 104 lb (47.174 kg)  07/10/11 98 lb (44.453 kg)  03/05/11 105 lb (47.628 kg)  01/29/11 102 lb 12 oz (46.607 kg)  08/01/10 110 lb 4 oz (50.009 kg)  06/14/10 112 lb (50.803 kg)  06/12/10 111 lb 4 oz (50.463 kg)  04/20/10 111 lb 1.9 oz (50.404 kg)    BMI:  Body mass index is 20.55 kg/(m^2).  Estimated Nutritional Needs:   Kcal:  BEE: 688kcals, TEE: (IF 1.2-1.4)(AF 1.2) 990-1155kcals  Protein:  42-50g protein (1.0-1.2g/kg)  Fluid:  1050-12360mL of fluid (25-2030mL/kg)  EDUCATION NEEDS:   No education needs identified at this time   HIGH Care Level  Leda QuailAllyson Robin Pafford, RD, LDN Pager [phone] Weekend/On-Call Pager [phone]

## 2015-01-19 LAB — BASIC METABOLIC PANEL
Anion gap: 7 (ref 5–15)
BUN: 24 mg/dL — AB (ref 6–20)
CHLORIDE: 111 mmol/L (ref 101–111)
CO2: 22 mmol/L (ref 22–32)
CREATININE: 0.84 mg/dL (ref 0.44–1.00)
Calcium: 8.5 mg/dL — ABNORMAL LOW (ref 8.9–10.3)
GFR calc Af Amer: >60 mL/min (ref >60)
GFR calc non Af Amer: 59 mL/min — ABNORMAL LOW (ref >60)
Glucose, Bld: 146 mg/dL — ABNORMAL HIGH (ref 65–99)
Potassium: 3.9 mmol/L (ref 3.5–5.1)
SODIUM: 140 mmol/L (ref 135–145)

## 2015-01-19 LAB — BLOOD GAS, ARTERIAL
Acid-base deficit: 1.2 mmol/L (ref 0.0–2.0)
Allens test (pass/fail): POSITIVE — AB
Bicarbonate: 22 mEq/L (ref 21.0–28.0)
FIO2: 0.24
O2 Saturation: 97.8 %
PCO2 ART: 31 mmHg — AB (ref 32.0–48.0)
PH ART: 7.46 — AB (ref 7.350–7.450)
Patient temperature: 37
pO2, Arterial: 96 mmHg (ref 83.0–108.0)

## 2015-01-19 LAB — URINE CULTURE: Culture: >=100000

## 2015-01-19 MED ORDER — PANTOPRAZOLE SODIUM 40 MG IV SOLR
40.0000 mg | INTRAVENOUS | Status: DC
  Administered 2015-01-19 – 2015-01-28 (×10): 40 mg via INTRAVENOUS
  Filled 2015-01-19 (×11): qty 40

## 2015-01-19 MED ORDER — SODIUM CHLORIDE 0.9 % IV SOLN
0.5000 g | INTRAVENOUS | Status: DC
  Administered 2015-01-19 – 2015-01-21 (×3): 0.5 g via INTRAVENOUS
  Filled 2015-01-19 (×3): qty 0.5

## 2015-01-19 NOTE — Care Management Important Message (Signed)
Important Message  Patient Details  Name: Christine Vance MRN: [id number] Date of Birth: 04/26/1920   Medicare Important Message Given:  Yes    Gwenette GreetBrenda S Hanson Medeiros, RN 01/19/2015, 11:41 AM

## 2015-01-19 NOTE — Progress Notes (Signed)
Palliative Care Update  Pt seen.  Chart reviewed.  Will try to talk with family tomorrow (none here at time of my visit this evening at 8 pm).  I am quite aware of issues prompting Palliative Care consult.   See full consult note to follow.  Suan HalterMargaret F Bobbette Eakes, MD

## 2015-01-19 NOTE — Progress Notes (Signed)
[facility] [place] [facility]   PATIENT NAME: Tacey HeapLorraine Dancer    MR#:  [id number]  DATE OF BIRTH:  04/07/1920  SUBJECTIVE:  CHIEF COMPLAINT:   Chief Complaint  Patient presents with  . Urinary Tract Infection    unresponsive   Sent from NH- found with UTI, malnourished, and dehydrated.   Remains partially drowsy/ sleepy- opens eyes, but disoriented.  REVIEW OF SYSTEMS:   Not able to provide ROS due to drowsiness/ disorientation. ROS  DRUG ALLERGIES:   Allergies  Allergen Reactions  . Codeine Sulfate Other (See Comments)    unknown  . Ivp Dye [Iodinated Diagnostic Agents] Other (See Comments)    unknown  . Lansoprazole Other (See Comments)    unknown  . Losartan Potassium-Hctz Other (See Comments)    unknown  . Penicillins Other (See Comments)    unknown  . Pravachol [Pravastatin] Other (See Comments)    unknown  . Prednisone Other (See Comments)    unknown  . Sulfa Antibiotics Other (See Comments)    unknown  . Sulfonamide Derivatives Other (See Comments)    unknown    VITALS:  Blood pressure 150/66, pulse 101, temperature 98.2 F (36.8 C), temperature source Oral, resp. rate 20, height 4\' 8"  (1.422 m), weight 42.366 kg (93 lb 6.4 oz), SpO2 99 %.  PHYSICAL EXAMINATION:   GENERAL: 82 y.o.-year-old patient lying in the bed with no acute distress. Drowzy. Opens eyes EYES: Pupils equal, round, reactive to light. No scleral icterus. Extraocular muscles intact.  HEENT: Head atraumatic, normocephalic. Oropharynx and nasopharynx clear. No oropharyngeal erythema, dry oral mucosa  NECK: Supple, no jugular venous distention. No thyroid enlargement, no tenderness.  LUNGS: Normal breath sounds bilaterally, no wheezing, rales, rhonchi. No use of accessory muscles of respiration.  CARDIOVASCULAR: S1, S2 normal. No murmurs, rubs, or gallops.  ABDOMEN: Soft, nontender, nondistended. Bowel sounds present. No organomegaly or mass.   EXTREMITIES: No pedal edema, cyanosis, or clubbing. + 2 pedal & radial pulses b/l. Diffuse muscle wasting NEUROLOGIC: Moves extremities to pain PSYCHIATRIC: The patient is drowzy.  Physical Exam LABORATORY PANEL:   CBC  Recent Labs Lab 01/18/15 0451  WBC 10.4  HGB 9.9*  HCT 31.6*  PLT 356   ------------------------------------------------------------------------------------------------------------------  Chemistries   Recent Labs Lab 01/11/2015 0920  01/19/15 0621  NA 157*  < > 140  K 4.0  < > 3.9  CL 124*  < > 111  CO2 25  < > 22  GLUCOSE 226*  < > 146*  BUN 56*  < > 24*  CREATININE 1.31*  < > 0.84  CALCIUM 9.8  < > 8.5*  AST 15  --   --   ALT 11*  --   --   ALKPHOS 93  --   --   BILITOT 1.5*  --   --   < > = values in this interval not displayed. ------------------------------------------------------------------------------------------------------------------  Cardiac Enzymes  Recent Labs Lab 02/02/2015 0920  TROPONINI 0.03   ------------------------------------------------------------------------------------------------------------------  RADIOLOGY:  No results found.  ASSESSMENT AND PLAN:   Active Problems:   UTI (lower urinary tract infection)  * UTI with sepsis an acute encephalopathy Was On rocephin. As per Cx- she has ESBL- changed to Ertapenem.   If POA wants full treatment- we may need PICC line.   Awaited palliative care consult.  * Hypernatremia with severe dehydration IV fluids. Repeat labs- some correction. replace K as needed. Pt is on Honey thickened liquids due to  dysphagia at [facility]  Appreciated SLP eval- NPO for Now.  I am afraid- she may need feeding tube as not improving enough.  * Severe malnutrition  NPO for now. Start diet once more awake. May need PEG if poor intake.  Consulted palliative care as overall very poor prognosis.  * ARF due to dehydration on IVF I/OS   Improved.  * DVT prophylaxis Lovenox  All the  records are reviewed and case discussed with Care Management/Social Workerr. Management plans discussed with the patient, family and they are in agreement.  CODE STATUS: FUll  TOTAL TIME TAKING CARE OF THIS PATIENT: 35 minutes.  Awaited Input of palliative care, I had a long discussion with POA yesterday. She still wants everything to be done for the pt.   POSSIBLE D/C IN 2-3 DAYS, DEPENDING ON CLINICAL CONDITION.   Altamese Dilling M.D on 01/19/2015   Between 7am to 6pm - Pager - [phone]  After 6pm go to [email or web address] - password [email or web address] [facility]  [facility]  Office  [phone]  CC: Primary care physician; Keane Police, MD  Note: This dictation was prepared with Dragon dictation along with smaller phrase technology. Any transcriptional errors that result from this process are unintentional.

## 2015-01-19 NOTE — Progress Notes (Signed)
Speech Therapy Note: received order, reviewed chart notes. Attempted to see pt for BSE/po trials, however, pt was poorly responsive to max. Verbal/tactile stim. Pt would not be safe to participate in a po assessment at this time d/t current presentation. Rec. Continue NPO status w/ oral care by NSG. ST will f/u tomorrow w/ pt's status and appropriateness for oral intake/evaluation. NSG updated.

## 2015-01-19 NOTE — Clinical Social Work Note (Signed)
Clinical Social Work Assessment  Patient Details  Name: Christine Vance MRN: [id number] Date of Birth: 04/06/1920  Date of referral:  01/19/15               Reason for consult:  Facility Placement                Permission sought to share information with:  Family Supports Permission granted to share information::  Yes, Verbal Permission Granted  Name::     Christine Vance, daughter and POA   Housing/Transportation Living arrangements for the past 2 months:  Skilled Building survey[facility]ursing Facility Source of Information:  Adult Children Patient Interpreter Needed:  None Criminal Activity/Legal Involvement Pertinent to Current Situation/Hospitalization:  No - Comment as needed Significant Relationships:  Adult Children Lives with:  Facility Resident Do you feel safe going back to the place where you live?  Yes Need for family participation in patient care:  Yes (Comment)  Care giving concerns:  No caregiving concerns identified.   Social Worker assessment / plan:  CSW spoke with pt's daughter to address consult. [facility] CSW introduced herself and explained role of social work. CSW also explained the process of returning to facility.   [facility]. CSW has sent information to facility and left a message requesting a return phone call.   Pt's daughter, Christine MendSheree, would like for pt to remain a full code and stated that "she is not giving up on her." Pt's daughter was very clear that she would like everything done. Pt's daughter was agreeable to a Palliative Care consult.  CSW will continue to follow.   Employment status:  Retired Database administratornsurance information:  Managed Medicare PT Recommendations:  Skilled Nursing Facility Information / Referral to community resources:  Skilled Nursing Facility  Patient/Family's Response to care:  Pt's daughter was Adult nurseappreciative of CSW support.   Patient/Family's Understanding of and Emotional Response to Diagnosis, Current  Treatment, and Prognosis:  Pt's daughter understands that pt needs around the clock care.   Emotional Assessment Appearance:  Appears stated age Attitude/Demeanor/Rapport:  Unable to Assess Affect (typically observed):  Flat Orientation:  Fluctuating Orientation (Suspected and/or reported Sundowners) Alcohol / Substance use:  Never Used Psych involvement (Current and /or in the community):  No (Comment)  Discharge Needs  Concerns to be addressed:  No discharge needs identified Readmission within the last 30 days:  No Current discharge risk:  None Barriers to Discharge:  No Barriers Identified   Dede QuerySarah Monserrat Vidaurri, LCSW 01/19/2015, 4:30 PM

## 2015-01-19 NOTE — NC FL2 (Signed)
[place] MEDICAID FL2 LEVEL OF CARE SCREENING TOOL     IDENTIFICATION  Patient Name: Christine Vance Birthdate: 03/12/1920 Sex: female Admission Date (Current Location): 01/25/2015  [place]   Facility and Address:  [facility], [address], [place], [place] [address]      Provider Number: [id number]  Attending Physician Name and Address:  Altamese DillingVaibhavkumar Vachhani, MD  Relative Name and Phone Number:       Current Level of Care: Hospital Recommended Level of Care: Skilled Nursing Facility Prior Approval Number:    Date Approved/Denied:   PASRR Number: [id number]  Discharge Plan: SNF    Current Diagnoses: Patient Active Problem List   Diagnosis Date Noted  . UTI (lower urinary tract infection) 01/13/2015  . Medicare annual wellness visit, initial 02/11/2014  . Osteoporosis 02/11/2014  . Dementia 02/11/2014  . Depression 02/11/2014  . Compression fracture 02/11/2014  . Weakness 12/20/2010  . Hypertension 08/01/2010  . ANXIETY 09/21/2008  . ALLERGIC RHINITIS 09/26/2007  . CONSTIPATION, CHRONIC 09/26/2007    Orientation RESPIRATION BLADDER Height & Weight    Self  Normal Incontinent 4\' 8"  (142.2 cm) 93 lbs.  BEHAVIORAL SYMPTOMS/MOOD NEUROLOGICAL BOWEL NUTRITION STATUS      Incontinent Diet (NPO)  AMBULATORY STATUS COMMUNICATION OF NEEDS Skin   Total Care Verbally Normal                       Personal Care Assistance Level of Assistance  Bathing, Feeding, Dressing, Total care Bathing Assistance: Maximum assistance Feeding assistance: Maximum assistance Dressing Assistance: Maximum assistance Total Care Assistance: Maximum assistance   Functional Limitations Info  Sight, Hearing, Speech Sight Info: Impaired Hearing Info: Impaired Speech Info: Impaired    SPECIAL CARE FACTORS FREQUENCY  Speech therapy                    Contractures      Additional Factors Info  Code Status,  Allergies Code Status Info: Full Code Allergies Info: Allergies: Codeine Sulfate, Ivp Dye, Lansoprazole, Losartan Potassium-hctz, Penicillins, Pravachol, Prednisone, Sulfa Antibiotics, Sulfonamide Derivatives           Current Medications (01/19/2015):  This is the current hospital active medication list Current Facility-Administered Medications  Medication Dose Route Frequency Provider Last Rate Last Dose  . 0.45 % sodium chloride infusion   Intravenous Continuous Altamese DillingVaibhavkumar Vachhani, MD 100 mL/hr at 01/19/15 0629    . albuterol (PROVENTIL) (2.5 MG/3ML) 0.083% nebulizer solution 2.5 mg  2.5 mg Nebulization Q2H PRN Srikar Sudini, MD      . bisacodyl (DULCOLAX) suppository 10 mg  10 mg Rectal Daily PRN Srikar Sudini, MD      . chlorhexidine (PERIDEX) 0.12 % solution 15 mL  15 mL Mouth Rinse BID Milagros LollSrikar Sudini, MD   15 mL at 01/19/15 1113  . enoxaparin (LOVENOX) injection 30 mg  30 mg Subcutaneous Q24H Milagros LollSrikar Sudini, MD   30 mg at 01/18/15 2036  . ertapenem (INVANZ) 0.5 g in sodium chloride 0.9 % 50 mL IVPB  0.5 g Intravenous Q24H Altamese DillingVaibhavkumar Vachhani, MD   0.5 g at 01/19/15 1113  . hydrALAZINE (APRESOLINE) injection 10 mg  10 mg Intravenous Q6H PRN Srikar Sudini, MD      . morphine 2 MG/ML injection 1 mg  1 mg Intravenous Q4H PRN Srikar Sudini, MD      . ondansetron (ZOFRAN) tablet 4 mg  4 mg Oral Q6H PRN Milagros LollSrikar Sudini, MD  Or  . ondansetron (ZOFRAN) injection 4 mg  4 mg Intravenous Q6H PRN Srikar Sudini, MD      . pantoprazole (PROTONIX) injection 40 mg  40 mg Intravenous Q24H Altamese Dilling, MD      . sodium chloride 0.9 % injection 3 mL  3 mL Intravenous Q12H Srikar Sudini, MD   3 mL at 01/19/15 1114  . sodium phosphate (FLEET) 7-19 GM/118ML enema 1 enema  1 enema Rectal Once PRN Milagros Loll, MD         Discharge Medications: Please see discharge summary for a list of discharge medications.  Relevant Imaging Results:  Relevant Lab Results:   Additional  Information SSN:  [id number]  Dede Query, LCSW

## 2015-01-19 NOTE — Plan of Care (Signed)
Problem: Education: Goal: Knowledge of [facility] Education information/materials will improve Outcome: Not Progressing Pt confused and lethargic. Minimal responsiveness.  Problem: Fluid Volume: Goal: Ability to maintain a balanced intake and output will improve Outcome: Progressing VSS. IVF infusing. Foley continues.  In am pt minimally responded to pain, MD notified, ABG's ordered. During the shift pt slightly more responsive. Urine culture positive for ESBL. Contact precautions and Invanz initiated.  Problem: Nutrition: Goal: Adequate nutrition will be maintained Outcome: Not Progressing Pt remains NPO. SLP follow up with pt.

## 2015-01-20 MED ORDER — ACETAMINOPHEN 650 MG RE SUPP
650.0000 mg | Freq: Four times a day (QID) | RECTAL | Status: DC | PRN
  Administered 2015-01-20: 650 mg via RECTAL
  Filled 2015-01-20: qty 1

## 2015-01-20 MED ORDER — ACETAMINOPHEN 325 MG PO TABS
650.0000 mg | ORAL_TABLET | Freq: Four times a day (QID) | ORAL | Status: DC | PRN
  Administered 2015-01-23 – 2015-02-02 (×3): 650 mg via ORAL
  Filled 2015-01-20 (×3): qty 2

## 2015-01-20 MED ORDER — KCL IN DEXTROSE-NACL 20-5-0.45 MEQ/L-%-% IV SOLN
INTRAVENOUS | Status: DC
  Administered 2015-01-20 – 2015-01-21 (×2): via INTRAVENOUS
  Filled 2015-01-20 (×4): qty 1000

## 2015-01-20 MED ORDER — ACETAMINOPHEN 60 MG HALF SUPP
60.0000 mg | RECTAL | Status: DC | PRN
  Filled 2015-01-20: qty 1

## 2015-01-20 NOTE — Progress Notes (Signed)
Speech Therapy Note: In to see patient for potential for PO trials.. However, The patient continues to be under aroused and is not safe for PO trials at this time.  Patient appears to be slightly more responsive than she was yesterday in that she did respond to 2 questions (weak voice and poorly articulated)  Rec. Continue NPO status w/ oral care by NSG. ST will f/u tomorrow w/ pt's status and appropriateness for oral intake/evaluation.

## 2015-01-20 NOTE — Consult Note (Addendum)
[facility] Infectious Disease     Reason for Consult: Gouru, A   Referring Physician: ESBL E coli Date of Admission:  01/12/2015   Active Problems:   UTI (lower urinary tract infection)   HPI: Christine Vance is a 82 y.o. female history of dementia, malnutrition and dysphagia sent from NH due to encephalopathy and fever. Found to have UTI with UA with TNTC WBC ESBL E coli.  No history is obtained from pt as she is laying in bed in fetal position and lethargic  Past Medical History  Diagnosis Date  . Edema leg   . Constipation   . LBBB (left bundle branch block)   . Allergic rhinitis   . Anxiety   . Alzheimer's dementia    Past Surgical History  Procedure Laterality Date  . Cholecystectomy  1960's  . Hemorrhoid surgery  late 1950's  . Other surgery  1960's    hysterectomy   Social History  Substance Use Topics  . Smoking status: Former Research scientist (life sciences)  . Smokeless tobacco: None     Comment: very brief  . Alcohol Use: No   Family History  Problem Relation Age of Onset  . Cancer Mother     colon  . Heart disease Father     MI  . Cancer Maternal Aunt     lung  . Diabetes Neg Hx   . Hypertension Neg Hx   . Coronary artery disease Neg Hx     Allergies:  Allergies  Allergen Reactions  . Codeine Sulfate Other (See Comments)    unknown  . Ivp Dye [Iodinated Diagnostic Agents] Other (See Comments)    unknown  . Lansoprazole Other (See Comments)    unknown  . Losartan Potassium-Hctz Other (See Comments)    unknown  . Penicillins Other (See Comments)    unknown  . Pravachol [Pravastatin] Other (See Comments)    unknown  . Prednisone Other (See Comments)    unknown  . Sulfa Antibiotics Other (See Comments)    unknown  . Sulfonamide Derivatives Other (See Comments)    unknown    Current antibiotics: Antibiotics Given (last 72 hours)    Date/Time Action Medication Dose Rate   01/19/15 1113 Given   ertapenem (INVANZ) 0.5 g in sodium chloride 0.9 % 50 mL IVPB 0.5  g 100 mL/hr   01/20/15 1336 Given   ertapenem (INVANZ) 0.5 g in sodium chloride 0.9 % 50 mL IVPB 0.5 g 100 mL/hr      MEDICATIONS: . chlorhexidine  15 mL Mouth Rinse BID  . enoxaparin (LOVENOX) injection  30 mg Subcutaneous Q24H  . ertapenem  0.5 g Intravenous Q24H  . pantoprazole (PROTONIX) IV  40 mg Intravenous Q24H  . sodium chloride  3 mL Intravenous Q12H    Review of Systems - unable to obtain OBJECTIVE: Temp:  [98.2 F (36.8 C)-98.6 F (37 C)] 98.6 F (37 C) (01/12 1300) Pulse Rate:  [79-86] 79 (01/12 1300) Resp:  [20-22] 20 (01/12 1300) BP: (137-157)/(37-68) 138/57 mmHg (01/12 1300) SpO2:  [97 %-98 %] 98 % (01/12 1300) Weight:  [42.502 kg (93 lb 11.2 oz)] 42.502 kg (93 lb 11.2 oz) (01/12 0500) Physical Exam  Constitutional:  Thin, chronically ill appearing, in fetal position, lethargic  HENT: /AT, , no scleral icterus Mouth/Throat: Oropharynx is clear and dry. No oropharyngeal exudate.  Cardiovascular: Normal rate, regular rhythm and normal heart sounds. Pulmonary/Chest: Effort normal  Rhonchi Neck = supple, no nuchal rigidity Abdominal: Soft. Bowel sounds are normal.  exhibits no distension. There is no tenderness.  Lymphadenopathy: no cervical adenopathy. No axillary adenopathy Neurological:lethargic, seems to have some LE contractures but not cooperative with exam Skin: Skin is warm and dry. No rash noted. No erythema.  Psychiatric: lethargic GU foley in place   LABS: Results for orders placed or performed during the hospital encounter of 01/11/2015 (from the past 48 hour(s))  Basic metabolic panel     Status: Abnormal   Collection Time: 01/19/15  6:21 AM  Result Value Ref Range   Sodium 140 135 - 145 mmol/L   Potassium 3.9 3.5 - 5.1 mmol/L   Chloride 111 101 - 111 mmol/L   CO2 22 22 - 32 mmol/L   Glucose, Bld 146 (H) 65 - 99 mg/dL   BUN 24 (H) 6 - 20 mg/dL   Creatinine, Ser 0.84 0.44 - 1.00 mg/dL   Calcium 8.5 (L) 8.9 - 10.3 mg/dL   GFR calc non Af Amer  59 (L) >60 mL/min   GFR calc Af Amer >60 >60 mL/min    Comment: (NOTE) The eGFR has been calculated using the CKD EPI equation. This calculation has not been validated in all clinical situations. eGFR's persistently <60 mL/min signify possible Chronic Kidney Disease.    Anion gap 7 5 - 15  Blood gas, arterial     Status: Abnormal   Collection Time: 01/19/15  8:05 AM  Result Value Ref Range   FIO2 0.24    pH, Arterial 7.46 (H) 7.350 - 7.450   pCO2 arterial 31 (L) 32.0 - 48.0 mmHg   pO2, Arterial 96 83.0 - 108.0 mmHg   Bicarbonate 22.0 21.0 - 28.0 mEq/L   Acid-base deficit 1.2 0.0 - 2.0 mmol/L   O2 Saturation 97.8 %   Patient temperature 37.0    Collection site RIGHT RADIAL    Sample type ARTERIAL DRAW    Allens test (pass/fail) POSITIVE (A) PASS   No components found for: ESR, C REACTIVE PROTEIN MICRO: Recent Results (from the past 720 hour(s))  Blood Culture (routine x 2)     Status: None (Preliminary result)   Collection Time: 01/18/2015  9:20 AM  Result Value Ref Range Status   Specimen Description BLOOD RIGHT ARM  Final   Special Requests   Final    BOTTLES DRAWN AEROBIC AND ANAEROBIC AEROBIC 2ML ANAEROBIC 1ML   Culture NO GROWTH 3 DAYS  Final   Report Status PENDING  Incomplete  Blood Culture (routine x 2)     Status: None (Preliminary result)   Collection Time: 01/11/2015  9:31 AM  Result Value Ref Range Status   Specimen Description BLOOD LEFT SIDE  Final   Special Requests BOTTLES DRAWN AEROBIC AND ANAEROBIC 1ML  Final   Culture NO GROWTH 3 DAYS  Final   Report Status PENDING  Incomplete  Urine culture     Status: None   Collection Time: 01/16/2015  9:31 AM  Result Value Ref Range Status   Specimen Description URINE, RANDOM  Final   Special Requests NONE  Final   Culture   Final    >=100,000 COLONIES/mL ESCHERICHIA COLI Results Called to: [facility] [place] AT 0900 ON 01/19/15 CTJ ESBL-EXTENDED SPECTRUM BETA LACTAMASE-THE ORGANISM IS RESISTANT TO PENICILLINS,  CEPHALOSPORINS AND AZTREONAM ACCORDING TO CLSI M100-S15 VOL.[place].    Report Status 01/19/2015 FINAL  Final   Organism ID, Bacteria ESCHERICHIA COLI  Final      Susceptibility   Escherichia coli - MIC*    AMPICILLIN >=32  RESISTANT Resistant     CEFTAZIDIME 4 RESISTANT Resistant     CEFAZOLIN >=64 RESISTANT Resistant     CEFTRIAXONE 32 RESISTANT Resistant     CIPROFLOXACIN >=4 RESISTANT Resistant     GENTAMICIN <=1 SENSITIVE Sensitive     IMIPENEM <=0.25 SENSITIVE Sensitive     TRIMETH/SULFA >=320 RESISTANT Resistant     Extended ESBL POSITIVE Resistant     NITROFURANTOIN Value in next row Sensitive      SENSITIVE<=16    PIP/TAZO Value in next row Sensitive      SENSITIVE<=4    AMPICILLIN/SULBACTAM Value in next row Sensitive      SENSITIVE4    * >=100,000 COLONIES/mL ESCHERICHIA COLI    IMAGING: Ct Head Wo Contrast  01/26/2015  CLINICAL DATA:  Found unresponsive this morning. EXAM: CT HEAD WITHOUT CONTRAST TECHNIQUE: Contiguous axial images were obtained from the base of the skull through the vertex without intravenous contrast. COMPARISON:  02/11/2014 FINDINGS: Stable age related cerebral atrophy, ventriculomegaly and periventricular white matter disease. No extra-axial fluid collections are identified. No CT findings for acute hemispheric infarction or intracranial hemorrhage. No mass lesions. The brainstem and cerebellum are normal. The bony structures are intact. No acute skull fracture. The paranasal sinuses and mastoid air cells are clear. The globes are intact. IMPRESSION: Stable age related cerebral atrophy, ventriculomegaly and periventricular white matter disease. No acute intracranial findings or skull fracture. Electronically Signed   By: Marijo Sanes M.D.   On: 01/24/2015 10:06   Dg Chest Port 1 View  02/01/2015  CLINICAL DATA:  Fever, found unresponsive this morning EXAM: PORTABLE CHEST 1 VIEW COMPARISON:  04/21/2014 FINDINGS: Cardiomediastinal silhouette is  stable. No acute infiltrate or pleural effusion. No pulmonary edema. Atherosclerotic calcifications of thoracic aorta again noted. Diffuse osteopenia again noted. IMPRESSION: No active disease.  No significant change. Electronically Signed   By: Lahoma Crocker M.D.   On: 01/20/2015 09:46    Assessment:   AAMIRAH SALMI is a 82 y.o. female dementia, admitted with UTI with ESBL E coli. She is currently not taking pos. WBC has improved from 14->10 and has defervesced.    Recommendations Continue ertapenem while not taking po At DC can send home on macrobid 100 bid for a total abx course of 14 days from admission. Would also place on oral macrobid 100 qd following completion of the treatment course. Poor prognosis and agree with Palliative care consult Thank you very much for allowing me to participate in the care of this patient. Please call with questions.   Cheral Marker. Ola Spurr, MD

## 2015-01-20 NOTE — Plan of Care (Signed)
Problem: Education: Goal: Knowledge of [facility] Education information/materials will improve Outcome: Not Progressing Pt unresponsive.  Unable to orient to unit.  Problem: Safety: Goal: Ability to remain free from injury will improve Outcome: Progressing Bed alarm in use.  Pt turned q 2 and pillows used between knees.  Pt is free from injury this shift.  Problem: Skin Integrity: Goal: Risk for impaired skin integrity will decrease Outcome: Progressing Foam replaced to sacrum.  No skin breakdown.  Applied 24 hour moisturizer to arms, elbows, legs and heels.  Moisture barrier to buttocks and perineum.  Problem: Fluid Volume: Goal: Ability to maintain a balanced intake and output will improve Outcome: Not Progressing Pt unresponsive. NPO.  SLP unable to do assessment.  Pt has a baseline of dysphagia with thickened liquids. IV fluids running .45 NS at 100 cc/hr.  Output from foley is adequate.  Problem: Nutrition: Goal: Adequate nutrition will be maintained Outcome: Not Progressing Remains NPO

## 2015-01-20 NOTE — Progress Notes (Signed)
[facility] - [place] [facility]   PATIENT NAME: Christine Vance    MR#:  [id number]  DATE OF BIRTH:  [birth year]  SUBJECTIVE:  CHIEF COMPLAINT:   Chief Complaint  Patient presents with  . Urinary Tract Infection    unresponsive   Sent from NH- found with UTI, malnourished, and dehydrated.   Remains partially drowsy/ sleepy- opens eyes, today she said she is doing fine. She feels hungry, could not get much history from the patient except few words  REVIEW OF SYSTEMS:   Not able to provide ROS due to drowsiness/ disorientation. ROS  DRUG ALLERGIES:   Allergies  Allergen Reactions  . Codeine Sulfate Other (See Comments)    unknown  . Ivp Dye [Iodinated Diagnostic Agents] Other (See Comments)    unknown  . Lansoprazole Other (See Comments)    unknown  . Losartan Potassium-Hctz Other (See Comments)    unknown  . Penicillins Other (See Comments)    unknown  . Pravachol [Pravastatin] Other (See Comments)    unknown  . Prednisone Other (See Comments)    unknown  . Sulfa Antibiotics Other (See Comments)    unknown  . Sulfonamide Derivatives Other (See Comments)    unknown    VITALS:  Blood pressure 138/57, pulse 79, temperature 98.6 F (37 C), temperature source Oral, resp. rate 20, height 4\' 8"  (1.422 m), weight 42.502 kg (93 lb 11.2 oz), SpO2 98 %.  PHYSICAL EXAMINATION:   GENERAL: 82 y.o.-year-old patient lying in the bed with no acute distress. Drowzy. Opens eyes to verbal commands EYES: Pupils equal, round, reactive to light. No scleral icterus. Extraocular muscles intact.  HEENT: Head atraumatic, normocephalic. Oropharynx and nasopharynx clear. No oropharyngeal erythema, dry oral mucosa  NECK: Supple, no jugular venous distention. No thyroid enlargement, no tenderness.  LUNGS: Normal breath sounds bilaterally, no wheezing, rales, rhonchi. No use of accessory muscles of respiration.  CARDIOVASCULAR: S1, S2 normal. No murmurs, rubs,  or gallops.  ABDOMEN: Soft, nontender, nondistended. Bowel sounds present. No organomegaly or mass.  EXTREMITIES: No pedal edema, cyanosis, or clubbing. + 2 pedal & radial pulses b/l. Diffuse muscle wasting NEUROLOGIC: Moves extremities to pain PSYCHIATRIC: The patient is drowzy.  Physical Exam LABORATORY PANEL:   CBC  Recent Labs Lab 01/18/15 0451  WBC 10.4  HGB 9.9*  HCT 31.6*  PLT 356   ------------------------------------------------------------------------------------------------------------------  Chemistries   Recent Labs Lab 01-18-2015 0920  01/19/15 0621  NA 157*  < > 140  K 4.0  < > 3.9  CL 124*  < > 111  CO2 25  < > 22  GLUCOSE 226*  < > 146*  BUN 56*  < > 24*  CREATININE 1.31*  < > 0.84  CALCIUM 9.8  < > 8.5*  AST 15  --   --   ALT 11*  --   --   ALKPHOS 93  --   --   BILITOT 1.5*  --   --   < > = values in this interval not displayed. ------------------------------------------------------------------------------------------------------------------  Cardiac Enzymes  Recent Labs Lab 01-18-15 0920  TROPONINI 0.03   ------------------------------------------------------------------------------------------------------------------  RADIOLOGY:  No results found.  ASSESSMENT AND PLAN:   Active Problems:   UTI (lower urinary tract infection)  * Acute encephalopathy secondary to acute cystitis from ESBL/failure to thrive with poor by mouth intake As per Cx- she has ESBL- changed to Ertapenem. But per urine culture ESBL sensitive to Zosyn and Macrodantin, discussed with Dr. Sampson Goon  regarding antibiotic choice   If POA wants full treatment- we may need PICC line.   Follow-up with palliative care Dr. Orvan Falconerampbell. Repeat swallow evaluation, unsuccessful continue patient nothing by mouth  * Hypernatremia with severe dehydration Clinically better with sodium 140 with IV fluids Pt was  [facility] Honey [facility]  Appreciated SLP  eval- NPO for Now.   * Severe malnutrition  NPO for now. Start diet once more awake after speech therapy evaluation Consulted palliative care as overall very poor prognosis.  * ARF secondary to dehydration from poor by mouth intake Resolved with IV fluids  poor prognosis   All the records are reviewed and case discussed with Care Management/Social Workerr. Management plans discussed with the patient, family and they are in agreement.  CODE STATUS: FUll  TOTAL TIME TAKING CARE OF THIS PATIENT: 35 minutes.  Awaited Input of palliative care, I had a long discussion with POA yesterday. She still wants everything to be done for the pt.   POSSIBLE D/C IN 2-3 DAYS, DEPENDING ON CLINICAL CONDITION.   Ramonita LabGouru, Sewell Pitner M.D on 01/20/2015   Between 7am to 6pm - Pager - [phone]   After 6pm go to [email or web address] - password [email or web address] [facility]  [facility]  Office  [phone]  CC: Primary care physician; Keane PoliceSLADE-HARTMAN, VENEZELA, MD  Note: This dictation was prepared with Dragon dictation along with smaller phrase technology. Any transcriptional errors that result from this process are unintentional.

## 2015-01-21 DIAGNOSIS — R630 Anorexia: Secondary | ICD-10-CM

## 2015-01-21 DIAGNOSIS — E43 Unspecified severe protein-calorie malnutrition: Secondary | ICD-10-CM | POA: Insufficient documentation

## 2015-01-21 DIAGNOSIS — Z515 Encounter for palliative care: Secondary | ICD-10-CM

## 2015-01-21 DIAGNOSIS — D649 Anemia, unspecified: Secondary | ICD-10-CM

## 2015-01-21 DIAGNOSIS — Z87891 Personal history of nicotine dependence: Secondary | ICD-10-CM

## 2015-01-21 DIAGNOSIS — K5900 Constipation, unspecified: Secondary | ICD-10-CM

## 2015-01-21 DIAGNOSIS — G9341 Metabolic encephalopathy: Secondary | ICD-10-CM

## 2015-01-21 LAB — BASIC METABOLIC PANEL
Anion gap: 6 (ref 5–15)
BUN: 18 mg/dL (ref 6–20)
CHLORIDE: 106 mmol/L (ref 101–111)
CO2: 20 mmol/L — ABNORMAL LOW (ref 22–32)
CREATININE: 0.87 mg/dL (ref 0.44–1.00)
Calcium: 8.1 mg/dL — ABNORMAL LOW (ref 8.9–10.3)
GFR, EST NON AFRICAN AMERICAN: 56 mL/min — AB (ref >60)
Glucose, Bld: 140 mg/dL — ABNORMAL HIGH (ref 65–99)
Potassium: 3.5 mmol/L (ref 3.5–5.1)
SODIUM: 132 mmol/L — AB (ref 135–145)

## 2015-01-21 LAB — CBC
HCT: 27 % — ABNORMAL LOW (ref 35.0–47.0)
HEMOGLOBIN: 8.8 g/dL — AB (ref 12.0–16.0)
MCH: 27.7 pg (ref 26.0–34.0)
MCHC: 32.6 g/dL (ref 32.0–36.0)
MCV: 85 fL (ref 80.0–100.0)
PLATELETS: 327 10*3/uL (ref 150–440)
RBC: 3.18 MIL/uL — AB (ref 3.80–5.20)
RDW: 16 % — ABNORMAL HIGH (ref 11.5–14.5)
WBC: 7 10*3/uL (ref 3.6–11.0)

## 2015-01-21 LAB — GLUCOSE, CAPILLARY: GLUCOSE-CAPILLARY: 148 mg/dL — AB (ref 65–99)

## 2015-01-21 MED ORDER — MEGESTROL ACETATE 400 MG/10ML PO SUSP
400.0000 mg | Freq: Two times a day (BID) | ORAL | Status: DC
  Administered 2015-01-21 – 2015-01-28 (×15): 400 mg via ORAL
  Filled 2015-01-21 (×16): qty 10

## 2015-01-21 MED ORDER — ERTAPENEM SODIUM 1 G IJ SOLR
0.5000 g | INTRAMUSCULAR | Status: AC
  Administered 2015-01-22 – 2015-01-28 (×7): 0.5 g via INTRAVENOUS
  Filled 2015-01-21 (×7): qty 0.5

## 2015-01-21 MED ORDER — ENSURE ENLIVE PO LIQD
237.0000 mL | Freq: Two times a day (BID) | ORAL | Status: DC
  Administered 2015-01-21: 237 mL via ORAL

## 2015-01-21 MED ORDER — NITROFURANTOIN MONOHYD MACRO 100 MG PO CAPS: 100.0000 mg | ORAL_CAPSULE | Freq: Two times a day (BID) | ORAL | Status: DC

## 2015-01-21 MED ORDER — POTASSIUM CHLORIDE IN NACL 20-0.9 MEQ/L-% IV SOLN
INTRAVENOUS | Status: DC
  Administered 2015-01-21 – 2015-01-25 (×7): via INTRAVENOUS
  Filled 2015-01-21 (×11): qty 1000

## 2015-01-21 NOTE — Care Management Important Message (Signed)
Important Message  Patient Details  Name: Christine Vance MRN: [id number] Date of Birth: 02/01/1920   Medicare Important Message Given:  Yes    Gwenette GreetBrenda S Lezlee Gills, RN 01/21/2015, 8:39 AM

## 2015-01-21 NOTE — Consult Note (Signed)
Palliative Medicine Inpatient Consult Note   Name: Christine Vance Date: 01/21/2015 MRN: [id number]  DOB: 1920-11-09  Referring Physician: Ramonita Lab, MD  Palliative Care consult requested for this 82 y.o. female for goals of medical therapy in patient with a UTI, dementia, dysphagia, and poor oral intake as well as other problems as outline below.  TODAY'S DISCUSSIONS AND DECISIONS: 1.  I have again reviewed past records and current notes.  Past records reflect daughter-in-law's consistent requests for full code status and aggressive care.   2.  After I reviewed as much history as I could gather, I called the patient's daughter -IN -LAW  (she is daughter in law ---not daughter though she refers to herself as such and stated that she has been the only person taking care of pt since 1995).    3.  I presented the problems of dysphagia, saliva going into the airway ('windpipe'), loss of appetite with advancing age, etc.  I talked about feeding tubes and dehydration.  She then got to a point in the conversation where she felt comfortable telling me in a nice way that she was not going to change her plans for her 'mother'.  She repeatedly stated that she was not being ugly, but this is the way it is going to be. She reported a story about pt having a feeding tube in the remote past and eventually having it removed.   4.  She wants the following:     UTI treated first before feeding tube is deemed essential     A feeding tube (PEG) placed when it comes time  (she does not have a preference of which GI doctor to call for this procedure).        5.  She does not like:     ---thick liquids because these 'cause dehydration'           -- I did not find an opportunity to talk with her further on this as she told me several stories about how thickened liquids caused dehyrdation and when pt was put back on thin liquids, she got better.    ---  Remeron (though she believes pt is depressed and this explains  part of the reason why she isn't eating yet today)  DO NOT START REMERON per daughter in law--( I will add to her long list of allergies)  6.  She says she will always want her 'mother' to be Full Code.    7.   I let her know I would pass all of her wishes along to pts attending.    I am not surprised at how this conversation went--based on my review of past records.  I did my best to help enlighten the pt's daughter-in-law, but was not really effective because she has rigid beliefs that anything wrong with the pt is due to medications ---or a UTI that isn't fully treated ---or due to 'thickened liquids' (which in her mind CAUSE problems).  She has many things she blames for pt's illnesses and dysphagia.  She does not seem to appreciate that pt is much older now than when she had her PEG before;  and she does not appreciate the natural course of dementia (even when it is explained to her).   I will sign off as I do not see how I can be of help given these directives from the daughter-in-law for the pt.     The only thing that I can recommend is that the  HCPOA form be obtained from the daughter-in-law ----in the event that at some point, her ability to make rational decisions for the pt is questioned.  Note that form from facility lists her as 'daughter' BUT she admitted to me that she is daughter-in-law (as is stated in older records).   IMPRESSION: Sepsis due to UTI UTI due to ESBL E coli ---ID ZOX:[id number] when not taking po.   ---Macrobid (to complete a 14 day course) once taking po and then macrobid 100 mg daily thereafter for suppression Alzheimer's Dementia Dysphagia ---Has been seen by SLP and now has a diet ordered for dysphagia 1 (pureed with honey thick liquids) Poor oral intake ---she started a diet of dysphagia 1 with honey thick liquids today and only took 2 bites per report Anxiety and h/o depression LBBB Constipation Dependent edema Moderate Malnutrition present at  admission Acute Renal Failure due to dehydration -resolved now Dehydration present at admission Former Smoker Multiple Drug Allergies or Sensitivities Fatigue and weakness Metabolic Encephalopathy Hyponatremia Anemia --unclear etiology (Hgb 8.8 01/21/15) Hyperglycemia Vit D deficiency H/O right hip fx and repair and gait disorder   REVIEW OF SYSTEMS:  Patient is not able to provide ROS due to illness   SOCIAL HISTORY:  reports that she has quit smoking. She does not have any smokeless tobacco history on file. She reports that she does not drink alcohol. Lives at SNF. Daughter-in-law (reportedly also 'POA') is [employer] --who had stated she wanted mother to remain FULL CODE and wanted everything done.   Resides at the [place] of [address]   Phone number for [employer] is [phone].   Mobile is [phone].   LEGAL DOCUMENTS:  None  CODE STATUS: Full code  PAST MEDICAL HISTORY: Past Medical History  Diagnosis Date  . Edema leg   . Constipation   . LBBB (left bundle branch block)   . Allergic rhinitis   . Anxiety   . Alzheimer's dementia     PAST SURGICAL HISTORY:  Past Surgical History  Procedure Laterality Date  . Cholecystectomy  1960's  . Hemorrhoid surgery  late 1950's  . Other surgery  1960's    hysterectomy    ALLERGIES:  is allergic to codeine sulfate; ivp dye; lansoprazole; losartan potassium-hctz; penicillins; pravachol; prednisone; sulfa antibiotics; and sulfonamide derivatives.  MEDICATIONS:  Current Facility-Administered Medications  Medication Dose Route Frequency Provider Last Rate Last Dose  . acetaminophen (TYLENOL) suppository 650 mg  650 mg Rectal Q6H PRN Altamese Dilling, MD   650 mg at 01/20/15 2100  . acetaminophen (TYLENOL) tablet 650 mg  650 mg Oral Q6H PRN Ramonita Lab, MD      . albuterol (PROVENTIL) (2.5 MG/3ML) 0.083% nebulizer solution 2.5 mg  2.5 mg Nebulization Q2H PRN Srikar Sudini, MD      . bisacodyl (DULCOLAX) suppository 10  mg  10 mg Rectal Daily PRN Srikar Sudini, MD      . chlorhexidine (PERIDEX) 0.12 % solution 15 mL  15 mL Mouth Rinse BID Milagros Loll, MD   15 mL at 01/19/15 2244  . dextrose 5 % and 0.45 % NaCl with KCl 20 mEq/L infusion   Intravenous Continuous Ramonita Lab, MD 100 mL/hr at 01/21/15 0858    . enoxaparin (LOVENOX) injection 30 mg  30 mg Subcutaneous Q24H Milagros Loll, MD   30 mg at 01/20/15 2100  . ertapenem (INVANZ) 0.5 g in sodium chloride 0.9 % 50 mL IVPB  0.5 g Intravenous Q24H Altamese Dilling, MD   0.5 g at  01/20/15 1336  . hydrALAZINE (APRESOLINE) injection 10 mg  10 mg Intravenous Q6H PRN Srikar Sudini, MD      . morphine 2 MG/ML injection 1 mg  1 mg Intravenous Q4H PRN Srikar Sudini, MD      . ondansetron (ZOFRAN) tablet 4 mg  4 mg Oral Q6H PRN Srikar Sudini, MD       Or  . ondansetron (ZOFRAN) injection 4 mg  4 mg Intravenous Q6H PRN Srikar Sudini, MD      . pantoprazole (PROTONIX) injection 40 mg  40 mg Intravenous Q24H Altamese Dilling, MD   40 mg at 01/20/15 1655  . sodium chloride 0.9 % injection 3 mL  3 mL Intravenous Q12H Srikar Sudini, MD   3 mL at 01/20/15 1337  . sodium phosphate (FLEET) 7-19 GM/118ML enema 1 enema  1 enema Rectal Once PRN Milagros Loll, MD        Vital Signs: BP 126/39 mmHg  Pulse 82  Temp(Src) 98 F (36.7 C) (Oral)  Resp 20  Ht 4\' 8"  (1.422 m)  Wt 42.048 kg (92 lb 11.2 oz)  BMI 20.79 kg/m2  SpO2 98% Filed Weights   01/19/15 0500 01/20/15 0500 01/21/15 0500  Weight: 42.366 kg (93 lb 6.4 oz) 42.502 kg (93 lb 11.2 oz) 42.048 kg (92 lb 11.2 oz)    Estimated body mass index is 20.79 kg/(m^2) as calculated from the following:   Height as of this encounter: 4\' 8"  (1.422 m).   Weight as of this encounter: 42.048 kg (92 lb 11.2 oz).  PERFORMANCE STATUS (ECOG) : 4 - Bedbound  PHYSICAL EXAM: Frail Pale Lying in medical bed, asleep She does not waken to my voice or exam No JVD or TM Hrt rrr no m Lungs cta ant-lat Abd soft and NT Skin  no mottling or cyanosis  LABS: CBC:    Component Value Date/Time   WBC 7.0 01/21/2015 0446   WBC 7.6 04/21/2014 1440   HGB 8.8* 01/21/2015 0446   HGB 8.8* 04/25/2014 0451   HCT 27.0* 01/21/2015 0446   HCT 35.3 04/21/2014 1440   PLT 327 01/21/2015 0446   PLT 350 04/24/2014 0344   MCV 85.0 01/21/2015 0446   MCV 91 04/21/2014 1440   NEUTROABS 10.1* 02-16-2015 0920   NEUTROABS 4.5 10/15/2013 0451   LYMPHSABS 2.4 02/16/2015 0920   LYMPHSABS 1.6 10/15/2013 0451   MONOABS 1.3* 02/16/2015 0920   MONOABS 0.5 10/15/2013 0451   EOSABS 0.0 Feb 16, 2015 0920   EOSABS 0.4 10/15/2013 0451   BASOSABS 0.2* Feb 16, 2015 0920   BASOSABS 0.1 10/15/2013 0451   Comprehensive Metabolic Panel:    Component Value Date/Time   NA 132* 01/21/2015 0446   NA 137 04/24/2014 0344   K 3.5 01/21/2015 0446   K 3.9 04/24/2014 0344   CL 106 01/21/2015 0446   CL 108 04/24/2014 0344   CO2 20* 01/21/2015 0446   CO2 25 04/24/2014 0344   BUN 18 01/21/2015 0446   BUN 23* 04/24/2014 0344   CREATININE 0.87 01/21/2015 0446   CREATININE 1.08* 04/25/2014 0451   GLUCOSE 140* 01/21/2015 0446   GLUCOSE 141* 04/24/2014 0344   CALCIUM 8.1* 01/21/2015 0446   CALCIUM 8.2* 04/24/2014 0344   AST 15 02-16-2015 0920   AST 20 04/21/2014 1440   ALT 11* Feb 16, 2015 0920   ALT 12* 04/21/2014 1440   ALKPHOS 93 02-16-15 0920   ALKPHOS 99 04/21/2014 1440   BILITOT 1.5* 02-16-15 0920   BILITOT 0.5 04/21/2014 1440  PROT 7.1 2015/03/06 0920   PROT 6.8 04/21/2014 1440   ALBUMIN 3.4* 2015/03/06 0920   ALBUMIN 3.9 04/21/2014 1440    More than 50% of the visit was spent in counseling/coordination of care: Yes  Time Spent:  80 minutes

## 2015-01-21 NOTE — Progress Notes (Signed)
Clinical Child psychotherapistocial Worker (CSW) discussed case with RN Sports coachCase Manager. Patient will likely not be ready for D/C over the weekend. [name]ic[facility] making her aware of above. CSW will continue to follow and assist as needed.   Jetta LoutBailey Vance, LCSWA [phone]

## 2015-01-21 NOTE — Plan of Care (Signed)
Problem: SLP Dysphagia Goals Goal: Misc Dysphagia Goal Pt will safely tolerate po diet of least restrictive consistency w/ no overt s/s of aspiration noted by Staff/pt/family x3 sessions.    

## 2015-01-21 NOTE — Evaluation (Signed)
Clinical/Bedside Swallow Evaluation Patient Details  Name: Christine Vance MRN: [id number] Date of Birth: 09/10/1920  Today's Date: 01/21/2015 Time: SLP Start Time (ACUTE ONLY): 0815 SLP Stop Time (ACUTE ONLY): 0900 SLP Time Calculation (min) (ACUTE ONLY): 45 min  Past Medical History:  Past Medical History  Diagnosis Date  . Edema leg   . Constipation   . LBBB (left bundle branch block)   . Allergic rhinitis   . Anxiety   . Alzheimer's dementia    Past Surgical History:  Past Surgical History  Procedure Laterality Date  . Cholecystectomy  1960's  . Hemorrhoid surgery  late 1950's  . Other surgery  1960's    hysterectomy   HPI:  Pt is a 82 y.o. female with a known history of dementia, malnutrition and dysphagia sent from NH due to encephalopathy and fever. Pt found to have UTI. She only moans at times but has not been fully responsive until today. Pt is NPO.   Assessment / Plan / Recommendation Clinical Impression  Pt appears at increased risk for aspiration sec. to her declined medical and Cognitive status'. She appears to adequately tolerate TSP trials of Honey consistency liquids and purees w/ no immediate, overt s/s of aspiration noted. Oral phase c/b min. decreaesed awareness overall but pt did attend to boluses once placed orally and cleared adequately. Pt required feeding w/ verbal cues to attend to, and follow through w/, tasks of eating/drinking. Rec. a dys. 1 diet w/ Honey consistency liquids w/ strict aspiration precautions; meds in Puree - crushed as able. Feeding assistance at all meals. Rec. pleasure ice chips (single presentation) w/ NSG supervision for pleasure and quality of life; improve oral comfort as long as no increased s/s of aspiration are noted. Suspect pt may be at her baseline in presentation at this time.     Aspiration Risk  Moderate aspiration risk    Diet Recommendation  Dys. 1 w/ Honey consistency liquids; strict aspiration precautions; feeding  assist  Medication Administration: Crushed with puree    Other  Recommendations Recommended Consults:  (Dietician f/u) Oral Care Recommendations: Oral care BID;Staff/trained caregiver to provide oral care Other Recommendations: Order thickener from pharmacy;Prohibited food (jello, ice cream, thin soups);Remove water pitcher   Follow up Recommendations  Skilled Nursing facility    Frequency and Duration min 2x/week  1 week       Prognosis Prognosis for Safe Diet Advancement: Fair Barriers to Reach Goals: Cognitive deficits;Severity of deficits      Swallow Study   General Date of Onset: 2015/11/28 HPI: Pt is a 82 y.o. female with a known history of dementia, malnutrition and dysphagia sent from NH due to encephalopathy and fever. Pt found to have UTI. She only moans at times but has not been fully responsive until today. Pt is NPO. Type of Study: Bedside Swallow Evaluation Previous Swallow Assessment: yes Diet Prior to this Study: Dysphagia 1 (puree);Honey-thick liquids Temperature Spikes Noted: No (wbc 7.0) Respiratory Status: Room air History of Recent Intubation: No Behavior/Cognition: Alert;Cooperative;Pleasant mood;Confused;Requires cueing (max. cues) Oral Cavity Assessment: Dry Oral Care Completed by SLP: Yes Oral Cavity - Dentition: Missing dentition;Adequate natural dentition Vision:  (n/a) Self-Feeding Abilities: Total assist Patient Positioning: Upright in bed Baseline Vocal Quality: Low vocal intensity Volitional Cough: Cognitively unable to elicit Volitional Swallow: Unable to elicit    Oral/Motor/Sensory Function Overall Oral Motor/Sensory Function:  (could not fully assess sec. to declined Cognitive status)   Ice Chips Ice chips: Impaired Presentation: Spoon (fed; 5 trials)  Oral Phase Impairments: Poor awareness of bolus (accepting trials but attended to trials once in mouth) Oral Phase Functional Implications:  (min. decreased oral manipulation  overall) Pharyngeal Phase Impairments:  (none)   Thin Liquid Thin Liquid: Not tested    Nectar Thick Nectar Thick Liquid: Not tested   Honey Thick Honey Thick Liquid: Impaired Presentation: Spoon (fed; ~2 ozs) Oral Phase Impairments: Poor awareness of bolus (accepting trials but attended to trials once in mouth) Oral Phase Functional Implications: Prolonged oral transit (min. ) Pharyngeal Phase Impairments:  (none)   Puree Puree: Impaired Presentation: Spoon (fed; ~2 ozs) Oral Phase Impairments: Poor awareness of bolus (accepting trials but attended to trials once in mouth) Oral Phase Functional Implications: Prolonged oral transit (min. ) Pharyngeal Phase Impairments:  (none)   Solid   GO   Solid: Not tested        Christine Som, MS, CCC-SLP  Christine Vance 01/21/2015,1:57 PM

## 2015-01-21 NOTE — Progress Notes (Signed)
[facility] Date of Admission:  01/19/2015     ID: Christine Vance is a 82 y.o. female with a ESBL E coli  Active Problems:   UTI (lower urinary tract infection)   Subjective: A little more alert. Per aide ate a little lunch and breakfast  ROS  Eleven systems are reviewed and negative except per hpi  Medications:  Antibiotics Given (last 72 hours)    Date/Time Action Medication Dose Rate   01/19/15 1113 Given   ertapenem (INVANZ) 0.5 g in sodium chloride 0.9 % 50 mL IVPB 0.5 g 100 mL/hr   01/20/15 1336 Given   ertapenem (INVANZ) 0.5 g in sodium chloride 0.9 % 50 mL IVPB 0.5 g 100 mL/hr   01/21/15 1000 Given   ertapenem (INVANZ) 0.5 g in sodium chloride 0.9 % 50 mL IVPB 0.5 g 100 mL/hr     . chlorhexidine  15 mL Mouth Rinse BID  . enoxaparin (LOVENOX) injection  30 mg Subcutaneous Q24H  . ertapenem  0.5 g Intravenous Q24H  . pantoprazole (PROTONIX) IV  40 mg Intravenous Q24H  . sodium chloride  3 mL Intravenous Q12H    Objective: Vital signs in last 24 hours: Temp:  [98 F (36.7 C)-98.8 F (37.1 C)] 98.8 F (37.1 C) (01/13 1352) Pulse Rate:  [82-98] 98 (01/13 1352) Resp:  [18-20] 20 (01/13 0501) BP: (111-126)/(39-45) 120/43 mmHg (01/13 1352) SpO2:  [97 %-98 %] 98 % (01/13 1352) Weight:  [42.048 kg (92 lb 11.2 oz)] 42.048 kg (92 lb 11.2 oz) (01/13 0500) Constitutional: Thin, chronically ill appearing, in fetal position, lethargic  HENT: [place]/AT, , no scleral icterus Mouth/Throat: Oropharynx is clear and dry. No oropharyngeal exudate.  Cardiovascular: Normal rate, regular rhythm and normal heart sounds. Pulmonary/Chest: Effort normal Rhonchi Neck = supple, no nuchal rigidity Abdominal: Soft. Bowel sounds are normal. exhibits no distension. There is no tenderness.  Lymphadenopathy: no cervical adenopathy. No axillary adenopathy Neurological:lethargic, seems to have some LE contractures but not cooperative with exam Skin: Skin is  warm and dry. No rash noted. No erythema.  Psychiatric: lethargic GU foley in place  Lab Results  Recent Labs  01/19/15 0621 01/21/15 0446  WBC  --  7.0  HGB  --  8.8*  HCT  --  27.0*  NA 140 132*  K 3.9 3.5  CL 111 106  CO2 22 20*  BUN 24* 18  CREATININE 0.84 0.87    Microbiology: Results for orders placed or performed during the hospital encounter of 01/15/2015  Blood Culture (routine x 2)     Status: None (Preliminary result)   Collection Time: 02/01/2015  9:20 AM  Result Value Ref Range Status   Specimen Description BLOOD RIGHT ARM  Final   Special Requests   Final    BOTTLES DRAWN AEROBIC AND ANAEROBIC AEROBIC 2ML ANAEROBIC 1ML   Culture NO GROWTH 4 DAYS  Final   Report Status PENDING  Incomplete  Blood Culture (routine x 2)     Status: None (Preliminary result)   Collection Time: 02/01/2015  9:31 AM  Result Value Ref Range Status   Specimen Description BLOOD LEFT SIDE  Final   Special Requests BOTTLES DRAWN AEROBIC AND ANAEROBIC 1ML  Final   Culture NO GROWTH 4 DAYS  Final   Report Status PENDING  Incomplete  Urine culture     Status: None   Collection Time: 02/02/2015  9:31 AM  Result Value Ref Range Status   Specimen Description URINE,  RANDOM  Final   Special Requests NONE  Final   Culture   Final    >=100,000 COLONIES/mL ESCHERICHIA COLI Results Called to: [facility] [place] AT 0900 ON 01/19/15 CTJ ESBL-EXTENDED SPECTRUM BETA LACTAMASE-THE ORGANISM IS RESISTANT TO PENICILLINS, CEPHALOSPORINS AND AZTREONAM ACCORDING TO CLSI M100-S15 VOL.25 N01 JAN 2005.    Report Status 01/19/2015 FINAL  Final   Organism ID, Bacteria ESCHERICHIA COLI  Final      Susceptibility   Escherichia coli - MIC*    AMPICILLIN >=32 RESISTANT Resistant     CEFTAZIDIME 4 RESISTANT Resistant     CEFAZOLIN >=64 RESISTANT Resistant     CEFTRIAXONE 32 RESISTANT Resistant     CIPROFLOXACIN >=4 RESISTANT Resistant     GENTAMICIN <=1 SENSITIVE Sensitive     IMIPENEM <=0.25 SENSITIVE Sensitive      TRIMETH/SULFA >=320 RESISTANT Resistant     Extended ESBL POSITIVE Resistant     NITROFURANTOIN Value in next row Sensitive      SENSITIVE<=16    PIP/TAZO Value in next row Sensitive      SENSITIVE<=4    AMPICILLIN/SULBACTAM Value in next row Sensitive      SENSITIVE4    * >=100,000 COLONIES/mL ESCHERICHIA COLI    Studies/Results: No results found.  Assessment/Plan: Christine Vance is a 82 y.o. female dementia, admitted with UTI with ESBL E coli. She is currently not taking pos. WBC has improved from 14->10 and has defervesced.   Recommendations Since starting to take  Po can dc ertapenem and change to macrobid At DC can send home on macrobid 100 bid for a total abx course of 14 days from admission. Would also place on oral macrobid 100 qd following completion of the treatment course. Poor prognosis and agree with Palliative care consult Thank you very much for the consult. Will follow with you.  Cornella Emmer   01/21/2015, 1:53 PM

## 2015-01-21 NOTE — Plan of Care (Signed)
Problem: Safety: Goal: Ability to remain free from injury will improve Outcome: Progressing Pt remains free from falls.  Fall precautions in place.  Problem: Skin Integrity: Goal: Risk for impaired skin integrity will decrease Outcome: Progressing Found pts IV to be infiltrated, red and swollen during shift assessment.  IV removed, ice pack applied, linens changed, new IV placed, fluids started over.  Problem: Fluid Volume: Goal: Ability to maintain a balanced intake and output will improve Outcome: Not Progressing Pt NPO

## 2015-01-21 NOTE — Progress Notes (Signed)
ID E note Per pharmacy Cr Cl is 23.  Dced macrobid as not used with CrCl < 30  Restarted ertapenem - would give 7 days total  Currently day 3/7

## 2015-01-21 NOTE — Progress Notes (Signed)
Nutrition Follow-up  DOCUMENTATION CODES:   Severe malnutrition in context of chronic illness  INTERVENTION:   Meals and Snacks: Cater to patient preferences as pt just advanced this am to Dysphagia I, Honey thick liquids per SLP; Pt a feeder, CNA assisted this am for breakfast Medical Food Supplement Therapy: will recommend Honey thick Mighty Shakes TID and Magic Cup BID on meal trays for added nutrition (each supplement provides approximately 300kcals and 9g protein) Coordination of Care: pt may require calorie count as pt on isolation and a feeder if documentation is limited, however will assess on follow as pt diet just advanced this am   NUTRITION DIAGNOSIS:   Inadequate oral intake related to inability to eat as evidenced by NPO status.  GOAL:   Patient will meet greater than or equal to 90% of their needs  MONITOR:    (Energy Intake, Anthropometrics, Electrolyte and rneal Profile, Digestive System, Glucose Profile)  REASON FOR ASSESSMENT:   Consult Poor PO  ASSESSMENT:   Pt admitted with AMS and UTI. Pt with h/o dementia, dysphagia and was on honey thickened liquids PTA. Palliative Care consulted. Pt remains on isolation.  Pt more alert this am on visit, spoke just a few words to RD. SLP able to evaluate pt this am advanced diet order.   Diet Order:  DIET - DYS 1 Room service appropriate?: Yes with Assist; Fluid consistency:: Honey Thick    Current Nutrition: Pt ate bites of applesauce with SLP this am and bites of ice chips. RD now sees 25% of breakfast tray recorded in I/O chart. Pt has been NPO since admission. Pt unable to verbalize intake PTA, however per chart review MD note reports poor po intake PTA.    Gastrointestinal Profile: Last BM: 01/20/2015   Scheduled Medications:  . chlorhexidine  15 mL Mouth Rinse BID  . enoxaparin (LOVENOX) injection  30 mg Subcutaneous Q24H  . ertapenem  0.5 g Intravenous Q24H  . pantoprazole (PROTONIX) IV  40 mg  Intravenous Q24H  . sodium chloride  3 mL Intravenous Q12H    Continuous Medications:  . dextrose 5 % and 0.45 % NaCl with KCl 20 mEq/L 100 mL/hr at 01/21/15 0858   D5 providing 408kcals over 24 hours   Electrolyte/Renal Profile and Glucose Profile:   Recent Labs Lab 01/18/15 0451 01/19/15 0621 01/21/15 0446  NA 148* 140 132*  K 4.0 3.9 3.5  CL 118* 111 106  CO2 25 22 20*  BUN 37* 24* 18  CREATININE 0.92 0.84 0.87  CALCIUM 8.6* 8.5* 8.1*  GLUCOSE 249* 146* 140*   Protein Profile:  Recent Labs Lab 15-Mar-2015 0920  ALBUMIN 3.4*      Weight Trend since Admission: Filed Weights   01/19/15 0500 01/20/15 0500 01/21/15 0500  Weight: 93 lb 6.4 oz (42.366 kg) 93 lb 11.2 oz (42.502 kg) 92 lb 11.2 oz (42.048 kg)    BMI:  Body mass index is 20.79 kg/(m^2).  Estimated Nutritional Needs:   Kcal:  BEE: 688kcals, TEE: (IF 1.2-1.4)(AF 1.2) 990-1155kcals  Protein:  42-50g protein (1.0-1.2g/kg)  Fluid:  1050-129560mL of fluid (25-6330mL/kg)  EDUCATION NEEDS:   No education needs identified at this time   HIGH Care Level  Leda QuailAllyson Pellegrino Kennard, RD, LDN Pager [phone] Weekend/On-Call Pager [phone]

## 2015-01-21 NOTE — Progress Notes (Signed)
[facility] - [place] at [facility]   PATIENT NAME: Taraoluwa Thakur    MR#:  [id number]  DATE OF BIRTH:  1920/07/03  SUBJECTIVE:  CHIEF COMPLAINT:   Chief Complaint  Patient presents with  . Urinary Tract Infection    unresponsive   Sent from NH- found with UTI, malnourished, and dehydrated.  Patient is more awake and alert today. He had bedside swallow evaluation and is placed on pure diet. No family members at bedside. Unable to reach family members  REVIEW OF SYSTEMS:   Limited review of systems Review of Systems  Constitutional: Negative for fever and chills.  Cardiovascular: Negative for chest pain and orthopnea.  Gastrointestinal: Negative for nausea, vomiting and abdominal pain.  Neurological: Positive for weakness.    DRUG ALLERGIES:   Allergies  Allergen Reactions  . Codeine Sulfate Other (See Comments)    unknown  . Ivp Dye [Iodinated Diagnostic Agents] Other (See Comments)    unknown  . Lansoprazole Other (See Comments)    unknown  . Losartan Potassium-Hctz Other (See Comments)    unknown  . Penicillins Other (See Comments)    unknown  . Pravachol [Pravastatin] Other (See Comments)    unknown  . Prednisone Other (See Comments)    unknown  . Sulfa Antibiotics Other (See Comments)    unknown  . Sulfonamide Derivatives Other (See Comments)    unknown    VITALS:  Blood pressure 120/43, pulse 98, temperature 98.8 F (37.1 C), temperature source Oral, resp. rate 20, height 4\' 8"  (1.422 m), weight 42.048 kg (92 lb 11.2 oz), SpO2 98 %.  PHYSICAL EXAMINATION:   GENERAL: 82 y.o.-year-old patient lying in the bed with no acute distress. Drowzy. Opens eyes to verbal commands EYES: Pupils equal, round, reactive to light. No scleral icterus. Extraocular muscles intact.  HEENT: Head atraumatic, normocephalic. Oropharynx and nasopharynx clear. No oropharyngeal erythema, dry oral mucosa  NECK: Supple, no jugular venous distention. No  thyroid enlargement, no tenderness.  LUNGS: Normal breath sounds bilaterally, no wheezing, rales, rhonchi. No use of accessory muscles of respiration.  CARDIOVASCULAR: S1, S2 normal. No murmurs, rubs, or gallops.  ABDOMEN: Soft, nontender, nondistended. Bowel sounds present. No organomegaly or mass.  EXTREMITIES: No pedal edema, cyanosis, or clubbing. + 2 pedal & radial pulses b/l. Diffuse muscle wasting NEUROLOGIC: Awake and alert but answers very few questions PSYCHIATRIC: The patient is awake and alert today  Physical Exam LABORATORY PANEL:   CBC  Recent Labs Lab 01/21/15 0446  WBC 7.0  HGB 8.8*  HCT 27.0*  PLT 327   ------------------------------------------------------------------------------------------------------------------  Chemistries   Recent Labs Lab 01/18/2015 0920  01/21/15 0446  NA 157*  < > 132*  K 4.0  < > 3.5  CL 124*  < > 106  CO2 25  < > 20*  GLUCOSE 226*  < > 140*  BUN 56*  < > 18  CREATININE 1.31*  < > 0.87  CALCIUM 9.8  < > 8.1*  AST 15  --   --   ALT 11*  --   --   ALKPHOS 93  --   --   BILITOT 1.5*  --   --   < > = values in this interval not displayed. ------------------------------------------------------------------------------------------------------------------  Cardiac Enzymes  Recent Labs Lab 02/02/2015 0920  TROPONINI 0.03   ------------------------------------------------------------------------------------------------------------------  RADIOLOGY:  No results found.  ASSESSMENT AND PLAN:   Active Problems:   UTI (lower urinary tract infection)   Protein-calorie malnutrition, severe  *  Acute encephalopathy secondary to acute cystitis from ESBL/failure to thrive with poor by mouth intake Patient is clinically improving but at a slower pace As per Cx- she has ESBL- changed to Ertapenem. But per urine culture ESBL sensitive to Zosyn and Macrodantin, discussed with Dr. Sampson GoonFitzgerald regarding antibiotic choice recommending  to continue ertapenem for now, eventually it can be changed to by mouth Macrodantin 100 mg by mouth twice a day for 2 weeks and to continue 100 mg once a day following that. Appreciate ID recommendations-   Unable to reach healthcare power of attorney.   Follow-up with palliative care Dr. Orvan Falconerampbell.Dr. Orvan Falconerampbell is trying to reach family members since yesterday  Repeat swallow evaluation done today and patient is started on pure diet  * Hyponatremia with severe dehydration Patient is started on diet today and on normal saline , will check BMP in a.m.  Pt   on  pure diet with Honey thickened liquids [facility]     * Severe malnutrition  Patient is started on pure diet with honey thick liquids as recommended by speech pathology  Will recommend dietary supplements and start the patient on Megace   Consulted palliative care as overall very poor prognosis.  * ARF secondary to dehydration from poor by mouth intake Resolved with IV fluids  poor prognosis   All the records are reviewed and case discussed with Care Management/Social Workerr. Palliative care Unable to reach healthcare power of attorney   CODE STATUS: FUll  TOTAL TIME TAKING CARE OF THIS PATIENT: 35 minutes.      POSSIBLE D/C IN 2-3 DAYS, DEPENDING ON CLINICAL CONDITION.   Ramonita LabGouru, Bowman Higbie M.D on 01/21/2015   Between 7am to 6pm - Pager - [phone]   After 6pm go to [email or web address] - password [email or web address] [facility]  [facility]  Office  [phone]  CC: Primary care physician; Keane PoliceSLADE-HARTMAN, VENEZELA, MD  Note: This dictation was prepared with Dragon dictation along with smaller phrase technology. Any transcriptional errors that result from this process are unintentional.

## 2015-01-22 ENCOUNTER — Inpatient Hospital Stay: Payer: Medicare Other

## 2015-01-22 LAB — BASIC METABOLIC PANEL
ANION GAP: 7 (ref 5–15)
BUN: 15 mg/dL (ref 6–20)
CALCIUM: 8.7 mg/dL — AB (ref 8.9–10.3)
CHLORIDE: 109 mmol/L (ref 101–111)
CO2: 21 mmol/L — AB (ref 22–32)
Creatinine, Ser: 0.8 mg/dL (ref 0.44–1.00)
GFR calc Af Amer: >60 mL/min (ref >60)
GFR calc non Af Amer: >60 mL/min (ref >60)
GLUCOSE: 156 mg/dL — AB (ref 65–99)
POTASSIUM: 4.1 mmol/L (ref 3.5–5.1)
Sodium: 137 mmol/L (ref 135–145)

## 2015-01-22 LAB — BLOOD GAS, ARTERIAL
ACID-BASE DEFICIT: 7.9 mmol/L — AB (ref 0.0–2.0)
Acid-base deficit: 12.7 mmol/L — ABNORMAL HIGH (ref 0.0–2.0)
Allens test (pass/fail): POSITIVE — AB
Allens test (pass/fail): POSITIVE — AB
BICARBONATE: 13.8 meq/L — AB (ref 21.0–28.0)
Bicarbonate: 16 mEq/L — ABNORMAL LOW (ref 21.0–28.0)
FIO2: 1
FIO2: 0.6
LHR: 26 {breaths}/min
MECHANICAL RATE: 26
MECHVT: 450 mL
O2 SAT: 98.7 %
O2 Saturation: 99.9 %
PCO2 ART: 27 mmHg — AB (ref 32.0–48.0)
PEEP/CPAP: 5 cmH2O
PEEP: 5 cmH2O
PH ART: 7.38 (ref 7.350–7.450)
Patient temperature: 37
Patient temperature: 37
RATE: 35 resp/min
VT: 450 mL
pCO2 arterial: 33 mmHg (ref 32.0–48.0)
pH, Arterial: 7.23 — ABNORMAL LOW (ref 7.350–7.450)
pO2, Arterial: 294 mmHg — ABNORMAL HIGH (ref 83.0–108.0)
pO2, Arterial: 122 mmHg — ABNORMAL HIGH (ref 83.0–108.0)

## 2015-01-22 LAB — CULTURE, BLOOD (ROUTINE X 2)
CULTURE: NO GROWTH
Culture: NO GROWTH

## 2015-01-22 LAB — GLUCOSE, CAPILLARY: GLUCOSE-CAPILLARY: 258 mg/dL — AB (ref 65–99)

## 2015-01-22 LAB — TRIGLYCERIDES: Triglycerides: 100 mg/dL (ref <150)

## 2015-01-22 LAB — MRSA PCR SCREENING: MRSA BY PCR: NEGATIVE

## 2015-01-22 MED ORDER — NOREPINEPHRINE BITARTRATE 1 MG/ML IV SOLN
0.0000 ug/min | INTRAVENOUS | Status: DC
  Administered 2015-01-22: 2 ug/min via INTRAVENOUS
  Filled 2015-01-22 (×2): qty 4

## 2015-01-22 MED ORDER — SODIUM CHLORIDE 0.9 % IV BOLUS (SEPSIS)
1000.0000 mL | Freq: Once | INTRAVENOUS | Status: AC
  Administered 2015-01-22: 1000 mL via INTRAVENOUS

## 2015-01-22 MED ORDER — VANCOMYCIN HCL 500 MG IV SOLR
500.0000 mg | INTRAVENOUS | Status: DC
  Administered 2015-01-22: 500 mg via INTRAVENOUS
  Filled 2015-01-22 (×2): qty 500

## 2015-01-22 MED ORDER — PROPOFOL 1000 MG/100ML IV EMUL
5.0000 ug/kg/min | INTRAVENOUS | Status: DC
  Administered 2015-01-22: 5 ug/kg/min via INTRAVENOUS
  Filled 2015-01-22: qty 100

## 2015-01-22 MED ORDER — VANCOMYCIN HCL 500 MG IV SOLR
500.0000 mg | Freq: Once | INTRAVENOUS | Status: AC
  Administered 2015-01-22: 500 mg via INTRAVENOUS
  Filled 2015-01-22: qty 500

## 2015-01-22 MED ORDER — ANTISEPTIC ORAL RINSE SOLUTION (CORINZ)
7.0000 mL | Freq: Four times a day (QID) | OROMUCOSAL | Status: DC
  Administered 2015-01-22 – 2015-02-02 (×41): 7 mL via OROMUCOSAL
  Filled 2015-01-22 (×14): qty 7

## 2015-01-22 MED ORDER — CHLORHEXIDINE GLUCONATE 0.12% ORAL RINSE (MEDLINE KIT)
15.0000 mL | Freq: Two times a day (BID) | OROMUCOSAL | Status: DC
  Administered 2015-01-22 – 2015-02-03 (×23): 15 mL via OROMUCOSAL
  Filled 2015-01-22 (×16): qty 15

## 2015-01-22 NOTE — Progress Notes (Signed)
[facility] Physicians - [place] at [facility]   PATIENT NAME: Christine Vance    MR#:  [id number]  DATE OF BIRTH:  Jul 01, 1920  SUBJECTIVE:  CHIEF COMPLAINT:   Chief Complaint  Patient presents with  . Urinary Tract Infection    unresponsive   Sent from NH- found with UTI, malnourished, and dehydrated.  Patient with her eyes open and moaning today am.  on pure diet. No family members at bedside.   REVIEW OF SYSTEMS:   Limited review of systems Review of Systems  Unable to perform ROS   DRUG ALLERGIES:   Allergies  Allergen Reactions  . Codeine Sulfate Other (See Comments)    unknown  . Ivp Dye [Iodinated Diagnostic Agents] Other (See Comments)    unknown  . Lansoprazole Other (See Comments)    unknown  . Losartan Potassium-Hctz Other (See Comments)    unknown  . Penicillins Other (See Comments)    unknown  . Pravachol [Pravastatin] Other (See Comments)    unknown  . Prednisone Other (See Comments)    unknown  . Sulfa Antibiotics Other (See Comments)    unknown  . Sulfonamide Derivatives Other (See Comments)    unknown    VITALS:  Blood pressure 146/66, pulse 102, temperature 98.9 F (37.2 C), temperature source Axillary, resp. rate 18, height 4\' 8"  (1.422 m), weight 42.23 kg (93 lb 1.6 oz), SpO2 96 %.  PHYSICAL EXAMINATION:   GENERAL: 82 y.o.-year-old patient lying in the bed with no acute distress.  EYES: Pupils equal, round, reactive to light. No scleral icterus. Extraocular muscles intact.  HEENT: Head atraumatic, normocephalic. Oropharynx and nasopharynx clear. No oropharyngeal erythema, dry oral mucosa  NECK: Supple, no jugular venous distention. No thyroid enlargement, no tenderness.  LUNGS: Diminished  breath sounds bilaterally, no wheezing, rales, rhonchi. No use of accessory muscles of respiration.  CARDIOVASCULAR: S1, S2 normal. No murmurs, rubs, or gallops.  ABDOMEN: Soft, nontender, nondistended. Bowel sounds present. No  organomegaly or mass.  EXTREMITIES: No pedal edema, cyanosis, or clubbing. + 2 pedal & radial pulses b/l. Diffuse muscle wasting NEUROLOGIC: Awake and moaning / mumbling  PSYCHIATRIC: The patient is awake and moaning  Physical Exam LABORATORY PANEL:   CBC  Recent Labs Lab 01/21/15 0446  WBC 7.0  HGB 8.8*  HCT 27.0*  PLT 327   ------------------------------------------------------------------------------------------------------------------  Chemistries   Recent Labs Lab 01/15/2015 0920  01/22/15 0503  NA 157*  < > 137  K 4.0  < > 4.1  CL 124*  < > 109  CO2 25  < > 21*  GLUCOSE 226*  < > 156*  BUN 56*  < > 15  CREATININE 1.31*  < > 0.80  CALCIUM 9.8  < > 8.7*  AST 15  --   --   ALT 11*  --   --   ALKPHOS 93  --   --   BILITOT 1.5*  --   --   < > = values in this interval not displayed. ------------------------------------------------------------------------------------------------------------------  Cardiac Enzymes  Recent Labs Lab 01/27/2015 0920  TROPONINI 0.03   ------------------------------------------------------------------------------------------------------------------  RADIOLOGY:  No results found.  ASSESSMENT AND PLAN:   Active Problems:   UTI (lower urinary tract infection)   Protein-calorie malnutrition, severe  * Acute encephalopathy secondary to acute cystitis from ESBL/failure to thrive with poor by mouth intake Patient is clinically improving but at a slower pacec As per Cx- she has ESBL- changed to Irtapenem. But per urine culture ESBL sensitive  to Zosyn and Macrodantin, discussed with Dr. Sampson GoonFitzgerald regarding antibiotic choice recommending to continue ertapenem for now, eventually it can be changed to by mouth Macrodantin 100 mg by mouth twice a day for 2 weeks and to continue 100 mg once a day following that. As per pharmacy creatinine clearance being less than 30 , ID changed abx to Irtapenem for 7 days total      Repeat swallow  evaluation done 1/13  and patient is started on pure diet  * Hyponatremia with severe dehydration Patient is started on diet and on normal saline , na is nml  Pt   on  pure diet with Honey thickened liquids [facility]     * Severe malnutrition  Patient is started on pure diet with honey thick liquids as recommended by speech pathology  started dietary supplements and started  the patient on Megace  Calorie count by dietician   palliative care Dr. Orvan Falconerampbell d/w pts daughter - in - law , [facility]) need to get papers to save in pts file wants aggressive measures and full code status. She will consider PEG tube if pt's condition doesn't improve.  Consulted palliative care as overall very poor prognosis.  * ARF secondary to dehydration from poor by mouth intake Resolved with IV fluids  poor prognosis   All the records are reviewed and case discussed with Care Management/Social Workerr. Palliative care Unable to reach healthcare power of attorney   CODE STATUS: FUll  TOTAL TIME TAKING CARE OF THIS PATIENT: 35 minutes.      POSSIBLE D/C IN 2-3 DAYS, DEPENDING ON CLINICAL CONDITION.   Ramonita LabGouru, Christine Vance M.D on 01/22/2015   Between 7am to 6pm - Pager - [phone]   After 6pm go to [email or web address] - password [email or web address] [facility]  [facility]  Office  [phone]  CC: Primary care physician; Keane PoliceSLADE-HARTMAN, VENEZELA, MD  Note: This dictation was prepared with Dragon dictation along with smaller phrase technology. Any transcriptional errors that result from this process are unintentional.

## 2015-01-22 NOTE — Progress Notes (Signed)
[facility] Physicians - [place] at [facility]   PATIENT NAME: Christine Vance    MR#:  [id number]  DATE OF BIRTH:  [birth year]  SUBJECTIVE:  CHIEF COMPLAINT:   Chief Complaint  Patient presents with  . Urinary Tract Infection    unresponsive   Sent from NH- found with UTI, malnourished, and dehydrated.   had bedside swallow evaluation and is placed on pure diet.  As per nurse- she was alert this morning- but did not receive any food.  Later around 2 pm- she was found unresponsive- and not detectable pulse.  Code Blue was activated and CPR was done for approximately 8-10 min.  She had successful recovery of pulse. Intubated. (01/22/15)  REVIEW OF SYSTEMS:    Review of Systems  Unable to perform ROS: intubated    DRUG ALLERGIES:   Allergies  Allergen Reactions  . Codeine Sulfate Other (See Comments)    unknown  . Ivp Dye [Iodinated Diagnostic Agents] Other (See Comments)    unknown  . Lansoprazole Other (See Comments)    unknown  . Losartan Potassium-Hctz Other (See Comments)    unknown  . Penicillins Other (See Comments)    unknown  . Pravachol [Pravastatin] Other (See Comments)    unknown  . Prednisone Other (See Comments)    unknown  . Sulfa Antibiotics Other (See Comments)    unknown  . Sulfonamide Derivatives Other (See Comments)    unknown    VITALS:  Blood pressure 146/66, pulse 102, temperature 98.9 F (37.2 C), temperature source Axillary, resp. rate 18, height 4\' 8"  (1.422 m), weight 42.23 kg (93 lb 1.6 oz), SpO2 96 %.  PHYSICAL EXAMINATION:   GENERAL: 82 y.o.-year-old patient lying in the bed with Intuibation. Drowzy.  EYES: Pupils equal, round, reactive to light. No scleral icterus.  HEENT: Head atraumatic, normocephalic. Oropharynx and nasopharynx clear. No oropharyngeal erythema, dry oral mucosa ETT in place. NECK: Supple, no jugular venous distention. No thyroid enlargement, no tenderness.  LUNGS: Normal breath sounds  bilaterally, no wheezing, some creps. No use of accessory muscles of respiration. on Ventilator support. CARDIOVASCULAR: S1, S2 normal. No murmurs.  ABDOMEN: Soft, nontender, nondistended. Bowel sounds present. No organomegaly or mass.  EXTREMITIES: No pedal edema, cyanosis, or clubbing. + 2 pedal & radial pulses b/l. Diffuse muscle wasting NEUROLOGIC: sedated on vent now. PSYCHIATRIC: not able to get due to sedation.  Physical Exam LABORATORY PANEL:   CBC  Recent Labs Lab 01/21/15 0446  WBC 7.0  HGB 8.8*  HCT 27.0*  PLT 327   ------------------------------------------------------------------------------------------------------------------  Chemistries   Recent Labs Lab 02/06/15 0920  01/22/15 0503  NA 157*  < > 137  K 4.0  < > 4.1  CL 124*  < > 109  CO2 25  < > 21*  GLUCOSE 226*  < > 156*  BUN 56*  < > 15  CREATININE 1.31*  < > 0.80  CALCIUM 9.8  < > 8.7*  AST 15  --   --   ALT 11*  --   --   ALKPHOS 93  --   --   BILITOT 1.5*  --   --   < > = values in this interval not displayed. ------------------------------------------------------------------------------------------------------------------  Cardiac Enzymes  Recent Labs Lab 2015-02-06 0920  TROPONINI 0.03   ------------------------------------------------------------------------------------------------------------------  RADIOLOGY:  No results found.  ASSESSMENT AND PLAN:   Active Problems:   UTI (lower urinary tract infection)   Protein-calorie malnutrition, severe  * Cardiac arrest  s/p Code Blue and CPR- with successful recovery of pulse- Intubated on vent.  BP stable- lower range after CPR.  Possible cause of cardiac arrest may be aspiration?   Her labs and vitals this morning appears satisfactory.   I spoke to her POA again during code- on phone- she want "everything to be done"   Will transfer to ICU for further care.   I spoke to Oncall intensivist- Dr. Dema SeverinMungal.    She is already on  ertapenem for ESBL UTI   Will add vancomycin for now. Currently BP stable- may add levophed in night if needed.     * Acute encephalopathy secondary to acute cystitis from ESBL/failure to thrive with poor by mouth intake As per Cx- she has ESBL- changed to Ertapenem. But per urine culture ESBL sensitive to Zosyn and Macrodantin, discussed with Dr. Sampson GoonFitzgerald regarding antibiotic choice recommending to continue ertapenem for now, eventually it can be changed to by mouth Macrodantin 100 mg by mouth twice a day for 2 weeks and to continue 100 mg once a day following that. Appreciate ID recommendations-   * Hyponatremia with severe dehydration Patient is started on diet and on normal saline , check BMP in a.m.  Pt   on  pure diet with Honey thickened liquids [facility] Will have to reassess now after extubation.    * Severe malnutrition  Patient is started on pure diet with honey thick liquids as recommended by speech pathology  Will recommend dietary supplements and start the patient on Megace - after Extubation.   * ARF secondary to dehydration from poor by mouth intake Resolved with IV fluids  Extremely poor prognosis   All the records are reviewed and case discussed with Care Management/Social Workerr. Palliative care Unable to reach healthcare power of attorney   CODE STATUS: FUll  TOTAL TIME TAKING CARE OF THIS PATIENT: 50 critical care minutes.   I spoke to floor nurses, pt's POA and ICU oncall doctor about the event and the plan.   POSSIBLE D/C IN 2-3 DAYS, DEPENDING ON CLINICAL CONDITION.   Altamese DillingVACHHANI, Lynx Goodrich M.D on 01/22/2015   Between 7am to 6pm - Pager - [phone].  After 6pm go to [email or web address] - password [email or web address] [facility]  [facility]  Office  [phone]  CC: Primary care physician; Keane PoliceSLADE-HARTMAN, VENEZELA, MD  Note: This dictation was prepared with Dragon dictation along with smaller phrase technology. Any transcriptional  errors that result from this process are unintentional.

## 2015-01-22 NOTE — Plan of Care (Signed)
Problem: Education: Goal: Knowledge of [place] [facility] Education information/materials will improve Outcome: Progressing Pt resting well throughout shift, no complaints of pain, patient remains free from falls bed alarm in place. On honey thick liquids doing well with medications and honey thick. IVF infusing per md order, will continue to monitor

## 2015-01-22 NOTE — Plan of Care (Signed)
Rounds made on patient. Resting comfortably but arousable. IV medication infusion occluded. In to assess IV infusion. Patient lying in bed pale, but warm to touch. Unresponsive. No response to sternal rub. Carotid pulse detected. Code Blue initiated. Code team arrived at bedside. Chest compressions initiated by RN. MD at bedside. Intubated. MD spoke with POA regarding condition. Family wants all heroic measures provided. Transported to ICU with RN at bedside. Report given to Maralyn SagoSarah, RN in ICU. NO unanswered questions.

## 2015-01-22 NOTE — Progress Notes (Signed)
   01/22/15 1400  Clinical Encounter Type  Visited With Patient not available  Visit Type Code  Referral From Nurse  Chaplain responded to code and silently prayed and waited on family outside of the room.   Fisher ScientificChaplain Trenna Kiely [phone]

## 2015-01-22 NOTE — ED Provider Notes (Signed)
[facility]  Department of Emergency Medicine  Patient evaluated 1:00 PM on 01/22/2015  Code Blue CONSULT NOTE  Chief Complaint: Cardiac arrest/unresponsive   Level V Caveat: Unresponsive  History of present illness: I was contacted by the hospital for a CODE BLUE cardiac arrest upstairs and presented to the patient's bedside.  Per nursing report the patient has been unresponsive for a total of 5-10 minutes without any CPR performed prior to my arrival. They're unsure if the patient has had a pulse. No spontaneous breathing observed by nursing. Patient was admitted for urosepsis.  ROS: Unable to obtain, Level V caveat  Scheduled Meds: . chlorhexidine  15 mL Mouth Rinse BID  . enoxaparin (LOVENOX) injection  30 mg Subcutaneous Q24H  . ertapenem  0.5 g Intravenous Q24H  . megestrol  400 mg Oral BID  . pantoprazole (PROTONIX) IV  40 mg Intravenous Q24H  . sodium chloride  3 mL Intravenous Q12H   Continuous Infusions: . 0.9 % NaCl with KCl 20 mEq / L 75 mL/hr at 01/22/15 1010   PRN Meds:.acetaminophen, acetaminophen, albuterol, bisacodyl, hydrALAZINE, morphine injection, ondansetron **OR** ondansetron (ZOFRAN) IV, sodium phosphate Past Medical History  Diagnosis Date  . Edema leg   . Constipation   . LBBB (left bundle branch block)   . Allergic rhinitis   . Anxiety   . Alzheimer's dementia    Past Surgical History  Procedure Laterality Date  . Cholecystectomy  1960's  . Hemorrhoid surgery  late 1950's  . Other surgery  1960's    hysterectomy   Social History   Social History  . Marital Status: Widowed    Spouse Name: N/A  . Number of Children: 2  . Years of Education: N/A   Occupational History  . hosiery worker     retired   Social History Main Topics  . Smoking status: Former Games developer  . Smokeless tobacco: Not on file     Comment: very brief  . Alcohol Use: No  . Drug Use: Not on file  . Sexual Activity: Not on file   Other Topics Concern  .  Not on file   Social History Narrative   Living at [place] [facility] [place]   Has 2 half brothers, one half sister.   Does not have a living will.   Grand daughter, Roanna Raider Spagnoli is HPOA   Allergies  Allergen Reactions  . Codeine Sulfate Other (See Comments)    unknown  . Ivp Dye [Iodinated Diagnostic Agents] Other (See Comments)    unknown  . Lansoprazole Other (See Comments)    unknown  . Losartan Potassium-Hctz Other (See Comments)    unknown  . Penicillins Other (See Comments)    unknown  . Pravachol [Pravastatin] Other (See Comments)    unknown  . Prednisone Other (See Comments)    unknown  . Sulfa Antibiotics Other (See Comments)    unknown  . Sulfonamide Derivatives Other (See Comments)    unknown    Last set of Vital Signs (not current) Filed Vitals:   01/22/15 0457 01/22/15 0958  BP:  146/66  Pulse:  102  Temp: 100.1 F (37.8 C) 98.9 F (37.2 C)  Resp:  18      Physical Exam  Gen: unresponsive Cardiovascular: pulseless.    Resp: apneic. Breath sounds equal bilaterally with bagging  Abd: nondistended  Neuro: GCS 3, unresponsive to pain  HEENT: No blood in posterior pharynx, gag reflex absent  Neck: No crepitus  Musculoskeletal: No deformity  Skin: warm  Procedures  INTUBATION Performed by: Scotty CourtSTAFFORD, Avarey Yaeger Required items: required blood products, implants, devices, and special equipment available Patient identity confirmed: provided demographic data and hospital-assigned identification number Time out: Immediately prior to procedure a "time out" was called to verify the correct patient, procedure, equipment, support staff and site/side marked as required. Indications: Cardiopulmonary arrest, hypoxic respiratory failure  Intubation method: Direct laryngoscopy Preoxygenation: BVM Sedatives: None  Paralytic: None  Tube Size: 7.5 cuffed Post-procedure assessment: chest rise and ETCO2 monitor.  There was immediate return of gastric contents through the  tracheal tube. I suctioned this with a long laryngeal suction catheter through the endotracheal tube Breath sounds: equal and absent over the epigastrium Tube secured by Respiratory Therapy Patient tolerated the procedure well with no immediate complications.  CRITICAL CARE Performed by: Scotty CourtSTAFFORD, Sonyia Muro Total critical care time: 10 minutes Critical care time was exclusive of separately billable procedures and treating other patients. Critical care was necessary to treat or prevent imminent or life-threatening deterioration. Critical care was time spent personally by me on the following activities: development of treatment plan with patient and/or surrogate as well as nursing, discussions with consultants, evaluation of patient's response to treatment, examination of patient, obtaining history from patient or surrogate, ordering and performing treatments and interventions, ordering and review of laboratory studies, ordering and review of radiographic studies, pulse oximetry and re-evaluation of patient's condition.    Cardiopulmonary Resuscitation (CPR) Procedure Note  Directed/Performed by: Sharman CheekSTAFFORD, Earnesteen Birnie I personally directed ancillary staff and/or performed CPR in an effort to regain return of spontaneous circulation and to maintain cardiac, neuro and systemic perfusion.    Medical Decision making  Patient found to be warm but pulseless and apneic on my arrival. Suspected respiratory arrest. CPR started immediately upon my arrival at about 1:00 PM.  She was given epinephrine as well while we placed the pads on the patient to go to assess her rhythm. At first rhythm check she was in PE A.  We continued compressions while I intubated the patient. After intubation and suctioning the airway, the patient quickly regained spontaneous circulation at next pulse check with a blood pressure of about 90/40 and a heart rate of 40. She was given atropine IV. Heart rate improved to 80.   Assessment  and Plan  Patient found to be in cardiopulmonary arrest due to aspiration and airway occlusion. This was corrected with intubation and airway suctioning with regain of pulses and spontaneous respiration attempts. Care of the patient transferred to hospitalist Dr. Madelon LipsVacchani for ICU upgrade. Follow-up chest x-ray pending at that time.    Sharman CheekPhillip Kinslei Labine, MD 01/22/15 1435

## 2015-01-22 NOTE — Progress Notes (Signed)
ANTIBIOTIC CONSULT NOTE - INITIAL  Pharmacy Consult for vancomycin Indication: pneumonia  Allergies  Allergen Reactions  . Codeine Sulfate Other (See Comments)    unknown  . Ivp Dye [Iodinated Diagnostic Agents] Other (See Comments)    unknown  . Lansoprazole Other (See Comments)    unknown  . Losartan Potassium-Hctz Other (See Comments)    unknown  . Penicillins Other (See Comments)    unknown  . Pravachol [Pravastatin] Other (See Comments)    unknown  . Prednisone Other (See Comments)    unknown  . Sulfa Antibiotics Other (See Comments)    unknown  . Sulfonamide Derivatives Other (See Comments)    unknown    Patient Measurements: Height: 4\' 8"  (142.2 cm) Weight: 93 lb 1.6 oz (42.23 kg) IBW/kg (Calculated) : 36.3 Adjusted Body Weight: n/a  Vital Signs: Temp: 98.9 F (37.2 C) (01/14 0958) Temp Source: Axillary (01/14 0958) BP: 146/66 mmHg (01/14 0958) Pulse Rate: 102 (01/14 0958) Intake/Output from previous day: 01/13 0701 - 01/14 0700 In: 928.3 [I.V.:878.3; IV Piggyback:50] Out: 1100 [Urine:1100] Intake/Output from this shift: Total I/O In: 1358.8 [I.V.:1358.8] Out: 425 [Urine:425]  Labs:  Recent Labs  01/21/15 0446 01/22/15 0503  WBC 7.0  --   HGB 8.8*  --   PLT 327  --   CREATININE 0.87 0.80   Estimated Creatinine Clearance: 25.7 mL/min (by C-G formula based on Cr of 0.8). No results for input(s): VANCOTROUGH, VANCOPEAK, VANCORANDOM, GENTTROUGH, GENTPEAK, GENTRANDOM, TOBRATROUGH, TOBRAPEAK, TOBRARND, AMIKACINPEAK, AMIKACINTROU, AMIKACIN in the last 72 hours.   Assessment: Pharmacy consulted to dose vancomycin for pneumonia in this 82 year old female. Code blue was called this afternoon and patient transferred to ICU.   Kinetics: Ke: 0.028 Half-life: 24.5 hours Vd: 29.5L Cmin ~ 17 mcg/mL CrCl ~ 29 mL/min Actual body weight = 42.2 kg  Goal of Therapy:  Vancomycin trough level 15-20 mcg/ml  Plan:  Measure antibiotic drug levels at steady  state Follow up culture results  Vancomycin 500 mg dose x1 Followed by vancomycin 500 mg IV q 24 hours with stacked dose starting 6 hours after initial dose Vanc trough ordered for 1/17 at 2130  Pharmacy will continue to monitor, thank you for the consult.  Jodelle RedMary M Lindley Stachnik 01/22/2015,3:09 PM

## 2015-01-22 NOTE — Progress Notes (Signed)
eLink Physician-Brief Progress Note Patient Name: Christine Vance DOB: 08/30/1920 MRN: [id number]   Date of Service  01/22/2015  HPI/Events of Note  ABG on 100%/PRVC 26/TV 450/P 5 = 7.23/33/294/13.8  eICU Interventions  Will order: 1. Increase PRVC rate to 35. 2. Repeat ABG at 5:15 PM.     Intervention Category Major Interventions: Respiratory failure - evaluation and management;Acid-Base disturbance - evaluation and management  Colten Desroches Eugene 01/22/2015, 4:04 PM

## 2015-01-22 NOTE — Progress Notes (Signed)
Calorie Count Note  48 hour calorie count ordered.  Discussed calorie count instructions with CNA, Ladona Ridgelaylor and items needed for calorie count placed in room.  RD to calculate once calorie count complete and follow up with MD regarding results  Oral nutrition supplements modified to meet honey thickened liquids diet order.   Nazly Digilio B. Freida BusmanAllen, RD, LDN [phone] (pager) Weekend/On-Call pager [phone])

## 2015-01-23 ENCOUNTER — Inpatient Hospital Stay: Payer: Medicare Other

## 2015-01-23 DIAGNOSIS — E43 Unspecified severe protein-calorie malnutrition: Secondary | ICD-10-CM

## 2015-01-23 DIAGNOSIS — J986 Disorders of diaphragm: Secondary | ICD-10-CM

## 2015-01-23 DIAGNOSIS — J9601 Acute respiratory failure with hypoxia: Secondary | ICD-10-CM

## 2015-01-23 DIAGNOSIS — A498 Other bacterial infections of unspecified site: Secondary | ICD-10-CM

## 2015-01-23 DIAGNOSIS — N390 Urinary tract infection, site not specified: Secondary | ICD-10-CM

## 2015-01-23 DIAGNOSIS — A4151 Sepsis due to Escherichia coli [E. coli]: Secondary | ICD-10-CM

## 2015-01-23 DIAGNOSIS — Z1612 Extended spectrum beta lactamase (ESBL) resistance: Secondary | ICD-10-CM

## 2015-01-23 LAB — CBC
HEMATOCRIT: 31.8 % — AB (ref 35.0–47.0)
Hemoglobin: 9.9 g/dL — ABNORMAL LOW (ref 12.0–16.0)
MCH: 26.1 pg (ref 26.0–34.0)
MCHC: 31 g/dL — ABNORMAL LOW (ref 32.0–36.0)
MCV: 84.3 fL (ref 80.0–100.0)
Platelets: 383 10*3/uL (ref 150–440)
RBC: 3.77 MIL/uL — AB (ref 3.80–5.20)
RDW: 17 % — ABNORMAL HIGH (ref 11.5–14.5)
WBC: 15.4 10*3/uL — AB (ref 3.6–11.0)

## 2015-01-23 LAB — CBC WITH DIFFERENTIAL/PLATELET
BASOS ABS: 0.1 10*3/uL (ref 0–0.1)
Basophils Relative: 1 %
EOS PCT: 0 %
Eosinophils Absolute: 0 10*3/uL (ref 0–0.7)
HCT: 30 % — ABNORMAL LOW (ref 35.0–47.0)
Hemoglobin: 9.3 g/dL — ABNORMAL LOW (ref 12.0–16.0)
LYMPHS ABS: 1.9 10*3/uL (ref 1.0–3.6)
LYMPHS PCT: 13 %
MCH: 26 pg (ref 26.0–34.0)
MCHC: 31.1 g/dL — AB (ref 32.0–36.0)
MCV: 83.8 fL (ref 80.0–100.0)
MONO ABS: 0.9 10*3/uL (ref 0.2–0.9)
Monocytes Relative: 6 %
Neutro Abs: 11.5 10*3/uL — ABNORMAL HIGH (ref 1.4–6.5)
Neutrophils Relative %: 80 %
PLATELETS: 334 10*3/uL (ref 150–440)
RBC: 3.58 MIL/uL — ABNORMAL LOW (ref 3.80–5.20)
RDW: 16.5 % — AB (ref 11.5–14.5)
WBC: 14.5 10*3/uL — ABNORMAL HIGH (ref 3.6–11.0)

## 2015-01-23 LAB — BASIC METABOLIC PANEL
ANION GAP: 10 (ref 5–15)
BUN: 19 mg/dL (ref 6–20)
CO2: 15 mmol/L — AB (ref 22–32)
Calcium: 8.2 mg/dL — ABNORMAL LOW (ref 8.9–10.3)
Chloride: 115 mmol/L — ABNORMAL HIGH (ref 101–111)
Creatinine, Ser: 1.02 mg/dL — ABNORMAL HIGH (ref 0.44–1.00)
GFR calc Af Amer: 54 mL/min — ABNORMAL LOW (ref >60)
GFR calc non Af Amer: 46 mL/min — ABNORMAL LOW (ref >60)
GLUCOSE: 188 mg/dL — AB (ref 65–99)
POTASSIUM: 3.4 mmol/L — AB (ref 3.5–5.1)
Sodium: 140 mmol/L (ref 135–145)

## 2015-01-23 LAB — BLOOD GAS, ARTERIAL
Allens test (pass/fail): POSITIVE — AB
FIO2: 0.35
LHR: 35 {breaths}/min
PEEP/CPAP: 5 cmH2O
PH ART: 7.59 — AB (ref 7.350–7.450)
Patient temperature: 37
VT: 450 mL
pCO2 arterial: 16 mmHg — CL (ref 32.0–48.0)
pO2, Arterial: 159 mmHg — ABNORMAL HIGH (ref 83.0–108.0)

## 2015-01-23 LAB — URINALYSIS COMPLETE WITH MICROSCOPIC (ARMC ONLY)
Bilirubin Urine: NEGATIVE
Glucose, UA: 50 mg/dL — AB
Nitrite: NEGATIVE
PROTEIN: 100 mg/dL — AB
SPECIFIC GRAVITY, URINE: 1.02 (ref 1.005–1.030)
pH: 5 (ref 5.0–8.0)

## 2015-01-23 LAB — C DIFFICILE QUICK SCREEN W PCR REFLEX
C Diff antigen: NEGATIVE
C Diff interpretation: NEGATIVE
C Diff toxin: NEGATIVE

## 2015-01-23 MED ORDER — VITAL AF 1.2 CAL PO LIQD
1000.0000 mL | ORAL | Status: DC
Start: 2015-01-23 — End: 2015-02-02
  Administered 2015-01-23 – 2015-02-01 (×8): 1000 mL

## 2015-01-23 MED ORDER — FREE WATER
200.0000 mL | Freq: Three times a day (TID) | Status: DC
Start: 2015-01-23 — End: 2015-01-29
  Administered 2015-01-23 – 2015-01-29 (×17): 200 mL

## 2015-01-23 MED ORDER — VANCOMYCIN HCL IN DEXTROSE 750-5 MG/150ML-% IV SOLN
750.0000 mg | INTRAVENOUS | Status: AC
  Administered 2015-01-24 – 2015-01-27 (×3): 750 mg via INTRAVENOUS
  Filled 2015-01-23 (×3): qty 150

## 2015-01-23 NOTE — Progress Notes (Signed)
RN notified by radiologist over the phone about PICC placement and recommendation of withdrawal 3-4 cm. RN notified [facility] about recommendation. PICC RN currently in another procedure so another staff member took a message for her. RN told by representative that someone would come to reposition PICC line and to not use until that point.

## 2015-01-23 NOTE — Progress Notes (Signed)
PULMONARY / CRITICAL CARE MEDICINE   Name: Christine Vance MRN: [id number] DOB: Jun 21, 1920    ADMISSION DATE:  02/02/2015 CONSULTATION DATE:  01/23/15  REFERRING MD :  Dr. Rosalio Macadamia   CHIEF COMPLAINT:   Reason for consult - CODE BLUE, vent management.    HISTORY OF PRESENT ILLNESS  History per chart review Patient is a 82 year old female with a known history of dementia, malnutrition, dysphagia that was admitted on 01/28/2015 for encephalopathy and fever, found to have a UTI. During the hospitalization it was Located with a PEA arrest times 8-10 minutes on 01/22/2015. History per chart review, patient on the morning of CODE BLUE was noted to have opening of eyes burning at times, mild to moderately responsive. On the afternoon of 01/22/2015 around 2 PM she was f found unresponsive, no pulses detected, ACLS was started for about 8-10 minutes, she had return of pulse and was transferred to the ICU for further management. Over the past few months, her clinical status is severely decline, with malnutrition, dehydration, hyponatremia, recurrent and resistant UTIs. Per clinical staff healthcare power of attorney is a daughter-in-law who has met with positive care in several occasions and patient is a full code.   SIGNIFICANT EVENTS  1/14>PEA arrest, ACLS x 2mns   PAST MEDICAL HISTORY    :  Past Medical History  Diagnosis Date  . Edema leg   . Constipation   . LBBB (left bundle branch block)   . Allergic rhinitis   . Anxiety   . Alzheimer's dementia    Past Surgical History  Procedure Laterality Date  . Cholecystectomy  1960's  . Hemorrhoid surgery  late 1950's  . Other surgery  1960's    hysterectomy   Prior to Admission medications   Medication Sig Start Date End Date Taking? Authorizing Provider  acetaminophen (TYLENOL) 325 MG tablet Take 650 mg by mouth 2 (two) times daily as needed.   Yes Historical Provider, MD  aspirin 81 MG chewable tablet Chew 81 mg by mouth  daily.   Yes Historical Provider, MD  cholecalciferol (VITAMIN D) 1000 units tablet Take 2,000 Units by mouth daily.   Yes Historical Provider, MD  polyethylene glycol powder (MIRALAX) powder Take 17 g by mouth daily.   Yes Historical Provider, MD  ranitidine (ZANTAC) 150 MG tablet Take 150 mg by mouth daily.   Yes Historical Provider, MD  senna-docusate (SENOKOT-S) 8.6-50 MG tablet Take 2 tablets by mouth 2 (two) times daily.   Yes Historical Provider, MD   Allergies  Allergen Reactions  . Codeine Sulfate Other (See Comments)    unknown  . Ivp Dye [Iodinated Diagnostic Agents] Other (See Comments)    unknown  . Lansoprazole Other (See Comments)    unknown  . Losartan Potassium-Hctz Other (See Comments)    unknown  . Penicillins Other (See Comments)    unknown  . Pravachol [Pravastatin] Other (See Comments)    unknown  . Prednisone Other (See Comments)    unknown  . Sulfa Antibiotics Other (See Comments)    unknown  . Sulfonamide Derivatives Other (See Comments)    unknown     FAMILY HISTORY   Family History  Problem Relation Age of Onset  . Cancer Mother     colon  . Heart disease Father     MI  . Cancer Maternal Aunt     lung  . Diabetes Neg Hx   . Hypertension Neg Hx   . Coronary artery disease Neg Hx  SOCIAL HISTORY    reports that she has quit smoking. She does not have any smokeless tobacco history on file. She reports that she does not drink alcohol. Her drug history is not on file.  Review of Systems  Unable to perform ROS: critical illness      VITAL SIGNS    Temp:  [96 F (35.6 C)-101.1 F (38.4 C)] 100.9 F (38.3 C) (01/15 0745) Pulse Rate:  [46-137] 97 (01/15 0745) Resp:  [18-35] 35 (01/15 0745) BP: (58-160)/(25-105) 85/52 mmHg (01/15 0745) SpO2:  [81 %-100 %] 100 % (01/15 0745) FiO2 (%):  [35 %-100 %] 35 % (01/15 0831) Weight:  [90 lb 2.7 oz (40.9 kg)-92 lb 9.5 oz (42 kg)] 92 lb 9.5 oz (42 kg) (01/15 0321) HEMODYNAMICS:    VENTILATOR SETTINGS: Vent Mode:  [-] PRVC FiO2 (%):  [35 %-100 %] 35 % Set Rate:  [26 bmp-35 bmp] 35 bmp Vt Set:  [450 mL] 450 mL PEEP:  [5 cmH20] 5 cmH20 Plateau Pressure:  [18 cmH20] 18 cmH20 INTAKE / OUTPUT:  Intake/Output Summary (Last 24 hours) at 01/23/15 0904 Last data filed at 01/23/15 0730  Gross per 24 hour  Intake   4208 ml  Output    691 ml  Net   3517 ml       PHYSICAL EXAM   Physical Exam  GENERAL: critical appearance, intubated and sedated HEENT: Head atraumatic, normocephalic. Oropharynx and nasopharynx clear. No oropharyngeal erythema, dry oral mucosa ETT in place. NECK: Supple, no jugular venous distention. No thyroid enlargement, no tenderness.  LUNGS:no wheezing, dec BS at the bases. On MV. CARDIOVASCULAR: S1, S2 normal. No murmurs.  ABDOMEN: Soft, nontender, nondistended. Bowel sounds present. No organomegaly or mass.  EXTREMITIES: No pedal edema, cyanosis, or clubbing. + 2 pedal & radial pulses b/l. Diffuse muscle wasting NEUROLOGIC: Intubated, will withdraw to pain, not tracking with eyes   LABS   LABS:  CBC  Recent Labs Lab 01/18/15 0451 01/21/15 0446 01/23/15 0523  WBC 10.4 7.0 15.4*  HGB 9.9* 8.8* 9.9*  HCT 31.6* 27.0* 31.8*  PLT 356 327 383   Coag's No results for input(s): APTT, INR in the last 168 hours. BMET  Recent Labs Lab 01/21/15 0446 01/22/15 0503 01/23/15 0523  NA 132* 137 140  K 3.5 4.1 3.4*  CL 106 109 115*  CO2 20* 21* 15*  BUN _0 CREATININE 0.87 0.80 1.02*  GLUCOSE 140* 156* 188*   Electrolytes  Recent Labs Lab 01/21/15 0446 01/22/15 0503 01/23/15 0523  CALCIUM 8.1* 8.7* 8.2*   Sepsis Markers  Recent Labs Lab 01/16/2015 0920  LATICACIDVEN 1.4   ABG  Recent Labs Lab 01/19/15 0805 01/22/15 1540 01/22/15 1715  PHART 7.46* 7.23* 7.38  PCO2ART 31* 33 27*  PO2ART 96 294* 122*   Liver Enzymes  Recent Labs Lab 01/24/2015 0920  AST 15  ALT 11*  ALKPHOS 93  BILITOT 1.5*   ALBUMIN 3.4*   Cardiac Enzymes  Recent Labs Lab 02/04/2015 0920  TROPONINI 0.03   Glucose  Recent Labs Lab 01/21/15 0732 01/22/15 1529  GLUCAP 148* 258*     Recent Results (from the past 240 hour(s))  Blood Culture (routine x 2)     Status: None   Collection Time: 01/09/2015  9:20 AM  Result Value Ref Range Status   Specimen Description BLOOD RIGHT ARM  Final   Special Requests   Final    BOTTLES DRAWN AEROBIC AND ANAEROBIC AEROBIC 2ML ANAEROBIC 1ML  Culture NO GROWTH 5 DAYS  Final   Report Status 01/22/2015 FINAL  Final  Blood Culture (routine x 2)     Status: None   Collection Time: 01/29/2015  9:31 AM  Result Value Ref Range Status   Specimen Description BLOOD LEFT SIDE  Final   Special Requests BOTTLES DRAWN AEROBIC AND ANAEROBIC 1ML  Final   Culture NO GROWTH 5 DAYS  Final   Report Status 01/22/2015 FINAL  Final  Urine culture     Status: None   Collection Time: 01/29/2015  9:31 AM  Result Value Ref Range Status   Specimen Description URINE, RANDOM  Final   Special Requests NONE  Final   Culture   Final    >=100,000 COLONIES/mL ESCHERICHIA COLI Results Called to: [facility] [place] AT 0900 ON 01/19/15 CTJ ESBL-EXTENDED SPECTRUM BETA LACTAMASE-THE ORGANISM IS RESISTANT TO PENICILLINS, CEPHALOSPORINS AND AZTREONAM ACCORDING TO CLSI M100-S15 VOL.[employer].    Report Status 01/19/2015 FINAL  Final   Organism ID, Bacteria ESCHERICHIA COLI  Final      Susceptibility   Escherichia coli - MIC*    AMPICILLIN >=32 RESISTANT Resistant     CEFTAZIDIME 4 RESISTANT Resistant     CEFAZOLIN >=64 RESISTANT Resistant     CEFTRIAXONE 32 RESISTANT Resistant     CIPROFLOXACIN >=4 RESISTANT Resistant     GENTAMICIN <=1 SENSITIVE Sensitive     IMIPENEM <=0.25 SENSITIVE Sensitive     TRIMETH/SULFA >=320 RESISTANT Resistant     Extended ESBL POSITIVE Resistant     NITROFURANTOIN Value in next row Sensitive      SENSITIVE<=16    PIP/TAZO Value in next row Sensitive       SENSITIVE<=4    AMPICILLIN/SULBACTAM Value in next row Sensitive      SENSITIVE4    * >=100,000 COLONIES/mL ESCHERICHIA COLI  MRSA PCR Screening     Status: None   Collection Time: 01/22/15  2:35 PM  Result Value Ref Range Status   MRSA by PCR NEGATIVE NEGATIVE Final    Comment:        The GeneXpert MRSA Assay (FDA approved for NASAL specimens only), is one component of a comprehensive MRSA colonization surveillance program. It is not intended to diagnose MRSA infection nor to guide or monitor treatment for MRSA infections.   C difficile quick scan w PCR reflex     Status: None   Collection Time: 01/23/15  3:59 AM  Result Value Ref Range Status   C Diff antigen NEGATIVE NEGATIVE Final   C Diff toxin NEGATIVE NEGATIVE Final   C Diff interpretation Negative for C. difficile  Final     Current facility-administered medications:  .  0.9 % NaCl with KCl 20 mEq/ L  infusion, , Intravenous, Continuous, Nicholes Mango, MD, Last Rate: 75 mL/hr at 01/23/15 0825 .  acetaminophen (TYLENOL) suppository 650 mg, 650 mg, Rectal, Q6H PRN, Vaughan Basta, MD, 650 mg at 01/20/15 2100 .  acetaminophen (TYLENOL) tablet 650 mg, 650 mg, Oral, Q6H PRN, Nicholes Mango, MD, 650 mg at 01/23/15 0533 .  albuterol (PROVENTIL) (2.5 MG/3ML) 0.083% nebulizer solution 2.5 mg, 2.5 mg, Nebulization, Q2H PRN, Srikar Sudini, MD .  antiseptic oral rinse solution (CORINZ), 7 mL, Mouth Rinse, QID, Nicholes Mango, MD, 7 mL at 01/23/15 0535 .  bisacodyl (DULCOLAX) suppository 10 mg, 10 mg, Rectal, Daily PRN, Srikar Sudini, MD .  chlorhexidine gluconate (PERIDEX) 0.12 % solution 15 mL, 15 mL, Mouth Rinse, BID, Aruna Gouru, MD, 15 mL at  01/23/15 0734 .  enoxaparin (LOVENOX) injection 30 mg, 30 mg, Subcutaneous, Q24H, Srikar Sudini, MD, 30 mg at 01/22/15 2049 .  ertapenem (INVANZ) 0.5 g in sodium chloride 0.9 % 50 mL IVPB, 0.5 g, Intravenous, Q24H, Adrian Prows, MD, 0.5 g at 01/22/15 1011 .  hydrALAZINE (APRESOLINE)  injection 10 mg, 10 mg, Intravenous, Q6H PRN, Hillary Bow, MD .  megestrol (MEGACE) 400 MG/10ML suspension 400 mg, 400 mg, Oral, BID, Nicholes Mango, MD, 400 mg at 01/22/15 2243 .  morphine 2 MG/ML injection 1 mg, 1 mg, Intravenous, Q4H PRN, Srikar Sudini, MD .  norepinephrine (LEVOPHED) 4 mg in dextrose 5 % 250 mL (0.016 mg/mL) infusion, 0-40 mcg/min, Intravenous, Titrated, Nicholes Mango, MD, Last Rate: 3.8 mL/hr at 01/23/15 0825, 1 mcg/min at 01/23/15 0825 .  ondansetron (ZOFRAN) tablet 4 mg, 4 mg, Oral, Q6H PRN **OR** ondansetron (ZOFRAN) injection 4 mg, 4 mg, Intravenous, Q6H PRN, Srikar Sudini, MD .  pantoprazole (PROTONIX) injection 40 mg, 40 mg, Intravenous, Q24H, Vaughan Basta, MD, 40 mg at 01/22/15 1625 .  propofol (DIPRIVAN) 1000 MG/100ML infusion, 5-80 mcg/kg/min, Intravenous, Titrated, Nicholes Mango, MD, Stopped at 01/22/15 1627 .  sodium chloride 0.9 % injection 3 mL, 3 mL, Intravenous, Q12H, Srikar Sudini, MD, 3 mL at 01/22/15 1011 .  sodium phosphate (FLEET) 7-19 GM/118ML enema 1 enema, 1 enema, Rectal, Once PRN, Srikar Sudini, MD .  vancomycin (VANCOCIN) 500 mg in sodium chloride 0.9 % 100 mL IVPB, 500 mg, Intravenous, Q24H, Lenis Noon, RPH, 500 mg at 01/22/15 2233  IMAGING    Dg Chest 1 View  01/22/2015  CLINICAL DATA:  Acute respiratory failure, former smoker. EXAM: CHEST 1 VIEW COMPARISON:  CT 01/23/2015 FINDINGS: Endotracheal tube 2.5 cm from carina. Elevation of the RIGHT hemidiaphragm is new. Associated RIGHT lower lobe atelectasis. No pulmonary edema. No pneumothorax. NG tube extends the stomach. Rounded density over the LEFT lung base likely represents nipple shadow. IMPRESSION: 1. Endotracheal tube in good position. 2. New elevation of the RIGHT hemidiaphragm with associated atelectasis. Electronically Signed   By: Suzy Bouchard M.D.   On: 01/22/2015 15:23   Dg Abd 1 View  01/22/2015  CLINICAL DATA:  OG tube placement. EXAM: ABDOMEN - 1 VIEW COMPARISON:  None.  FINDINGS: Orogastric tube extends through the GE junction into the proximal stomach. Mild gaseous distention of the bowel is noted. There is moderate increased stool distending the rectum. No overt obstruction. IMPRESSION: 1. Orogastric tube passes below the diaphragm into the proximal stomach. 2. Mild gaseous distention of the bowel without convincing obstruction. Moderate increased stool in the rectum. Electronically Signed   By: Lajean Manes M.D.   On: 01/22/2015 15:24      Indwelling Urinary Catheter continued, requirement due to   Reason to continue Indwelling Urinary Catheter for strict Intake/Output monitoring for hemodynamic instability   Central Line continued, requirement due to   Reason to continue [employer] Monitoring of central venous pressure or other hemodynamic parameters   Ventilator continued, requirement due to, resp failure    Ventilator Sedation RASS 0 to -2   Cultures: BCx2 1/14 UC 1/9 - ESBL E. Coli Sputum MRSA screen - negative  Antibiotics:  Ertapenem 1/13 Vancomycin 1/13  Lines:   ASSESSMENT/PLAN  82 year female past medical history of dementia, recurrent UTI, currently with ESBL UTI, status post CODE BLUE.  PULMONARY OETT Acute respiratory failure PEA arrest times 8-10 minutes Mechanical ventilation Hypoxia Possible right hemi-diaphragm elevation Sepsis P:   -Secondary to PEA  arrest, now becoming a bit more responsive -Continue mechanical ventilation-maintain O2 sats greater than 88% -ABG as needed -Wean mechanical ventilation, FiO2 as tolerated -Recheck chest x-ray in the morning -Continue with current antibiotics and hemodynamic support  CARDIOVASCULAR CODE BLUE-PEA arrest Hypertension P:  -Continue to trend cardiac enzymes -Possibly secondary to sepsis, ESBL UTI -Continue with current vasopressors, wean as tolerated -Continue with hemodynamic monitoring  RENAL -Electrolytes -ICU electrolyte replacement  protocol  GASTROINTESTINAL -OGT -Start tube feeds today -PPI prophylaxis  HEMATOLOGIC -Monitor CBC line-leukocytosis secondary to stress leukemoid reaction along with sepsis  INFECTIOUS ESBL UTI P:   -Continue current antibiotics -Infectious disease currently following along -Patient will require at least 7 days of IV antibiotics, will place consult for PICC line  ENDOCRINE -ICU hypo/hyperglycemia protocol  NEUROLOGIC A:  Encephalopathy-metabolic, worsening dementia; multifactorial P:   RASS goal: 0 -Multifactorial reasons for encephalopathy including dementia, sepsis, recent PEA arrests. -Review of chart shows that patient has been slowly declining and may of had continued progression of her dementia, her healthcare power of attorney has verified understanding of patient's clinical status, and we'll continue with full CODE STATUS. -Currently patient is withdrawing to pain and tracking extremities decide of touch and pain, she is not tracking with her eyes today. Patient is currently not requiring any type of sedation or agitation medications.  SOCIAL - FULL CODE - Palliative Care signed off, see note on 1/13 - updated [facility] [place]) via telephone.   I have personally obtained a history, examined the patient, evaluated laboratory and imaging results, formulated the assessment and plan and placed orders.  The Patient requires high complexity decision making for assessment and support, frequent evaluation and titration of therapies, application of advanced monitoring technologies and extensive interpretation of multiple databases. Critical Care Time devoted to patient care services described in this note is 55 minutes.   Overall, patient is critically ill, prognosis is guarded. Patient at high risk for cardiac arrest and death.   Vilinda Boehringer, MD [facility] Pager [phone] (please enter 7-digits) On Call Pager [phone] (please enter  7-digits)     01/23/2015, 9:04 AM  Note: This note was prepared with Dragon dictation along with smaller phrase technology. Any transcriptional errors that result from this process are unintentional.

## 2015-01-23 NOTE — Progress Notes (Addendum)
Pharmacy Antibiotic Follow-up Note  Christine HeathLorraine M Vance is a 82 y.o. year-old female admitted on 01/23/2015.  The patient is currently on day 5 of Ertapenem 500mg  Q24h for ESBL UTI and Day 2 of Vancomycin 500mg  q24hfor sepsis.  Assessment/Plan: Scr increased 0.8 > 1.02. Will adjust Vancomycin to 750mg  IV Q36h.  Ke 0.014 t1/2 28.8  Vd 29.4.  TRough 1/17 (day 4, not at steady state).  ID following.  Temp (24hrs), Avg:100.2 F (37.9 C), Min:96 F (35.6 C), Max:101.1 F (38.4 C)   Recent Labs Lab 2015/07/01 0920 01/18/15 0451 01/21/15 0446 01/23/15 0523 01/23/15 0950  WBC 14.1* 10.4 7.0 15.4* 14.5*    Recent Labs Lab 01/18/15 0451 01/19/15 0621 01/21/15 0446 01/22/15 0503 01/23/15 0523  CREATININE 0.92 0.84 0.87 0.80 1.02*   Estimated Creatinine Clearance: 20.2 mL/min (by C-G formula based on Cr of 1.02).    Allergies  Allergen Reactions  . Codeine Sulfate Other (See Comments)    unknown  . Ivp Dye [Iodinated Diagnostic Agents] Other (See Comments)    unknown  . Lansoprazole Other (See Comments)    unknown  . Losartan Potassium-Hctz Other (See Comments)    unknown  . Penicillins Other (See Comments)    unknown  . Pravachol [Pravastatin] Other (See Comments)    unknown  . Prednisone Other (See Comments)    unknown  . Sulfa Antibiotics Other (See Comments)    unknown  . Sulfonamide Derivatives Other (See Comments)    unknown    Antimicrobials this admission: Aztreonam 1/9 x1 Levaquin 1/9 x1 Vancomycin 1/9 x1 CTX 1/9 >>1/11 Ertapenem  1/11 >>   Vancomycin 1/14 >>    Levels/dose changes this admission: Vancomycin change from 500mg  IV q24h to 750mg  Q36h on 1/15 d/t Scr increase.  Microbiology results: 1/9 BCx: NGTD  1/9 UCx: E.coli ESBL  1/15 UA: 2+LE,TNTC WBC, rare bacteria   Sputum: none 1/14 MRSA PCR: neg 1/15 Cdiff neg  Thank you for allowing pharmacy to be a part of this patient's care.  Majel Giel A PharmD 01/23/2015 12:14 PM

## 2015-01-23 NOTE — Progress Notes (Addendum)
[facility] - [place] at [facility]   PATIENT NAME: Christine HeapLorraine Ezelle    MR#:  [id number]  DATE OF BIRTH:  07/22/1920  SUBJECTIVE:  CHIEF COMPLAINT:   Chief Complaint  Patient presents with  . Urinary Tract Infection    unresponsive   Sent from NH- found with UTI, malnourished, and dehydrated.  Patient coded ,PEA , arrest time 8-10 min yesterday and was moved to intensive care unit and got intubated 1/14. Currently on pressors to support her blood pressure, not on any sedation. During my examination her eyes were open and tracking to pain stimuli. No family members at bedside  REVIEW OF SYSTEMS:   Limited review of systems Review of Systems  Unable to perform ROS   DRUG ALLERGIES:   Allergies  Allergen Reactions  . Codeine Sulfate Other (See Comments)    unknown  . Ivp Dye [Iodinated Diagnostic Agents] Other (See Comments)    unknown  . Lansoprazole Other (See Comments)    unknown  . Losartan Potassium-Hctz Other (See Comments)    unknown  . Penicillins Other (See Comments)    unknown  . Pravachol [Pravastatin] Other (See Comments)    unknown  . Prednisone Other (See Comments)    unknown  . Sulfa Antibiotics Other (See Comments)    unknown  . Sulfonamide Derivatives Other (See Comments)    unknown    VITALS:  Blood pressure 78/47, pulse 84, temperature 99.9 F (37.7 C), temperature source Rectal, resp. rate 18, height 4\' 8"  (1.422 m), weight 42 kg (92 lb 9.5 oz), SpO2 100 %.  PHYSICAL EXAMINATION:   GENERAL: 82 y.o.-year-old patient lying in the bed with no acute distress.  EYES: Pupils equal, round, reactive to light. No scleral icterus. Extraocular muscles intact.  HEENT: Head atraumatic, normocephalic. ET tube intact  NECK: Supple, no jugular venous distention. No thyroid enlargement, no tenderness.  LUNGS: Diminished  breath sounds bilaterally, no wheezing, rales, rhonchi. No use of accessory muscles of respiration.   CARDIOVASCULAR: S1, S2 normal. No murmurs, rubs, or gallops.  ABDOMEN: Soft, nontender, nondistended. Bowel sounds present. No organomegaly or mass.  EXTREMITIES: No pedal edema, cyanosis, or clubbing. + 2 pedal & radial pulses b/l. Diffuse muscle wasting NEUROLOGIC: Awake and moaning / mumbling  PSYCHIATRIC: The patient is awake and moaning  Physical Exam LABORATORY PANEL:   CBC  Recent Labs Lab 01/23/15 0950  WBC 14.5*  HGB 9.3*  HCT 30.0*  PLT 334   ------------------------------------------------------------------------------------------------------------------  Chemistries   Recent Labs Lab 02-18-2015 0920  01/23/15 0523  NA 157*  < > 140  K 4.0  < > 3.4*  CL 124*  < > 115*  CO2 25  < > 15*  GLUCOSE 226*  < > 188*  BUN 56*  < > 19  CREATININE 1.31*  < > 1.02*  CALCIUM 9.8  < > 8.2*  AST 15  --   --   ALT 11*  --   --   ALKPHOS 93  --   --   BILITOT 1.5*  --   --   < > = values in this interval not displayed. ------------------------------------------------------------------------------------------------------------------  Cardiac Enzymes  Recent Labs Lab 02-18-2015 0920  TROPONINI 0.03   ------------------------------------------------------------------------------------------------------------------  RADIOLOGY:  Dg Chest 1 View  01/22/2015  CLINICAL DATA:  Acute respiratory failure, former smoker. EXAM: CHEST 1 VIEW COMPARISON:  CT 2015-09-08 FINDINGS: Endotracheal tube 2.5 cm from carina. Elevation of the RIGHT hemidiaphragm is new. Associated RIGHT lower lobe  atelectasis. No pulmonary edema. No pneumothorax. NG tube extends the stomach. Rounded density over the LEFT lung base likely represents nipple shadow. IMPRESSION: 1. Endotracheal tube in good position. 2. New elevation of the RIGHT hemidiaphragm with associated atelectasis. Electronically Signed   By: Genevive Bi M.D.   On: 01/22/2015 15:23   Dg Abd 1 View  01/22/2015  CLINICAL DATA:  OG  tube placement. EXAM: ABDOMEN - 1 VIEW COMPARISON:  None. FINDINGS: Orogastric tube extends through the GE junction into the proximal stomach. Mild gaseous distention of the bowel is noted. There is moderate increased stool distending the rectum. No overt obstruction. IMPRESSION: 1. Orogastric tube passes below the diaphragm into the proximal stomach. 2. Mild gaseous distention of the bowel without convincing obstruction. Moderate increased stool in the rectum. Electronically Signed   By: Amie Portland M.D.   On: 01/22/2015 15:24   Dg Chest Port 1 View  01/23/2015  CLINICAL DATA:  Acute respiratory failure, on ventilator, suspected aspiration, Alzheimer's, former smoker EXAM: PORTABLE CHEST 1 VIEW COMPARISON:  Portable exam 0952 hours compared to 01/22/2015 FINDINGS: Tip of endotracheal tube is within 1 cm of carina, directed toward RIGHT mainstem bronchus, recommend withdrawal 2 cm. Nasogastric tube coiled in stomach. Normal heart size, mediastinal contours and pulmonary vascularity. Atherosclerotic calcification aorta. Lungs clear. No pleural effusion or pneumothorax. IMPRESSION: Lungs clear. Improved aeration RIGHT base since previous exam. Tip of endotracheal tube is within 1 cm carina directed towards RIGHT mainstem bronchus; recommend withdrawal 2 cm. Findings called to [facility] RN in ICU on 01/23/2015 at 1202 hours. Electronically Signed   By: Ulyses Southward M.D.   On: 01/23/2015 12:03    ASSESSMENT AND PLAN:   Active Problems:   UTI (lower urinary tract infection)   Protein-calorie malnutrition, severe  *Acute hypoxic respiratory failure secondary to CODE BLUE-PEA Patient was transferred to intensive care unit and intubated -on mechanical ventilator day1 vent to management per protocol Appreciate critical care recommendations  * Acute encephalopathy secondary to sepsis secondary to possible aspiration pneumonia, superimposed on acute cystitis from ESBL/failure to thrive with poor by mouth  intake Continue broad-spectrum IV antibiotics Pressors to support her blood pressure IV fluids Follow-up on the bloodcultures Chest x-ray with elevated right hemidiaphragm- follow-up with infectious disease Needs PICC line for IV antibiotics, will discuss with Dr. Sampson Goon tomorrow  Prior to CODE BLUE- per Cx- she has ESBL- continue Ertapenem and vancomycin.   Repeat swallow evaluation done 1/13  and patient is started on pure diet,  currently patient is nothing by mouth since yesterday following CODE BLUE.  * Hyponatremia with severe dehydration Patient is started on diet and on normal saline , na is nml  Pt   on  pure diet with Honey thickened liquids due to dysphagia at [facility].     * Severe malnutrition  Patient is started on pure diet with honey thick liquids as recommended by speech pathology  following CODE BLUE on 01/22/2015 patient was made nothing by mouth , providing orogastric feeds with vital 1.2 at 45 cc/h   the patient on Megace  Calorie count by dietician   palliative care Dr. Orvan Falconer d/w pts daughter - in - law , [facility]) need to get papers to save in pts file wants aggressive measures and full code status. She will consider PEG tube if pt's condition doesn't improve.  Consulted palliative care as overall very poor prognosis.  * ARF secondary to dehydration from poor by mouth intake Resolved with IV fluids  poor prognosis   All the records are reviewed and case discussed with Care Management/Social Workerr. Palliative care Unable to reach healthcare power of attorney   CODE STATUS: FUll  TOTAL CRITICAL CARE TIME TAKING CARE OF THIS PATIENT: 35 minutes.      POSSIBLE D/C IN ?DAYS, DEPENDING ON CLINICAL CONDITION.   Ramonita Lab M.D on 01/23/2015   Between 7am to 6pm - Pager - [phone]   After 6pm go to [email or web address] - password [email or web address] [facility]  [place] [facility]  Office  [phone]  CC: Primary care physician; Keane Police,  MD  Note: This dictation was prepared with Dragon dictation along with smaller phrase technology. Any transcriptional errors that result from this process are unintentional.

## 2015-01-23 NOTE — Progress Notes (Signed)
Nutrition Follow-up  DOCUMENTATION CODES:   Severe malnutrition in context of chronic illness  INTERVENTION:  EN: Consulted to start enteral nutrition. Recommend vital 1.2 at goal rate of 2445ml/hr. Will provide 1296 kcals and 81 gm of protein, and 874ml free water.     NUTRITION DIAGNOSIS:   Inadequate oral intake related to inability to eat as evidenced by NPO status.     GOAL:   Patient will meet greater than or equal to 90% of their needs     MONITOR:    (Energy Intake, Anthropometrics, Electrolyte and rneal Profile, Digestive System, Glucose Profile)  REASON FOR ASSESSMENT:   Consult Poor PO  ASSESSMENT:   Pt admitted with AMS and UTI. Pt with h/o dementia, dysphagia and was on honey thickened liquids PTA. Palliative Care consulted.  Pt s/p PEA arrest now intubated   Current Nutrition: NPO   Gastrointestinal Profile: Last BM: 1/15   Scheduled Medications:  . antiseptic oral rinse  7 mL Mouth Rinse QID  . chlorhexidine gluconate  15 mL Mouth Rinse BID  . enoxaparin (LOVENOX) injection  30 mg Subcutaneous Q24H  . ertapenem  0.5 g Intravenous Q24H  . megestrol  400 mg Oral BID  . pantoprazole (PROTONIX) IV  40 mg Intravenous Q24H  . sodium chloride  3 mL Intravenous Q12H  . vancomycin  500 mg Intravenous Q24H    Continuous Medications:  . 0.9 % NaCl with KCl 20 mEq / L 75 mL/hr at 01/23/15 0825  . norepinephrine (LEVOPHED) Adult infusion 1 mcg/min (01/23/15 1115)  . propofol (DIPRIVAN) infusion Stopped (01/22/15 1627)     Electrolyte/Renal Profile and Glucose Profile:   Recent Labs Lab 01/21/15 0446 01/22/15 0503 01/23/15 0523  NA 132* 137 140  K 3.5 4.1 3.4*  CL 106 109 115*  CO2 20* 21* 15*  BUN 18 15 19   CREATININE 0.87 0.80 1.02*  CALCIUM 8.1* 8.7* 8.2*  GLUCOSE 140* 156* 188*   Protein Profile:  Recent Labs Lab 01/12/2015 0920  ALBUMIN 3.4*      Weight Trend since Admission: Filed Weights   01/22/15 0500 01/22/15 1500  01/23/15 0321  Weight: 93 lb 1.6 oz (42.23 kg) 90 lb 2.7 oz (40.9 kg) 92 lb 9.5 oz (42 kg)      Diet Order:   NPO  Skin:   reviewed  Last BM:  unknown  Height:   Ht Readings from Last 1 Encounters:  01/26/2015 4\' 8"  (1.422 m)    Weight:   Wt Readings from Last 1 Encounters:  01/23/15 92 lb 9.5 oz (42 kg)    Ideal Body Weight:     BMI:  Body mass index is 20.77 kg/(m^2).  Estimated Nutritional Needs:   Kcal:  TEE 1340 kcals/d (Ve 16, Tmax 38.3)  Protein:  (1.2-2.0 g/kg) 50-84 g/d)  Fluid:  (25-7133ml/kg) 1050-12560ml/d  EDUCATION NEEDS:   No education needs identified at this time  HIGH Care Level  Cailie Bosshart B. Freida BusmanAllen, RD, LDN [phone] (pager) Weekend/On-Call pager [phone])

## 2015-01-23 NOTE — Progress Notes (Signed)
PICC RN wanted clarification of reposition recommendation as her interpretation was that 3-4 cm would be too far to pull back PICC line. [place] [facility] clarification. New radiologist (previous radiologist no longer on shift) gave interpretation of PICC from left terminating in "high right atrium" and recommendation of 2 cm to pull back. RN conveyed to PICC RN. PICC RN to reposition.

## 2015-01-23 NOTE — Progress Notes (Signed)
elink MD, Dr. Katrinka BlazingSmith notified about pt's increase in temp to 101.1, no new orders given at this time but instructed to give prn ordered tylenol.

## 2015-01-23 NOTE — Progress Notes (Signed)
PT Cancellation Note  Patient Details Name: Christine HeathLorraine M Boniface MRN: [id number] DOB: 08/14/1920   Cancelled Treatment:    Reason Eval/Treat Not Completed: Medical issues which prohibited therapy  Pt coded yesterday and is now intubated.  She is not appropriate for PT at this time and will need new PT orders if she is appropriate for PT later during this stay.  Loran SentersGalen Darik Massing, PT, DPT [phone]  Malachi ProGalen R Philemon Riedesel 01/23/2015, 9:59 AM

## 2015-01-23 NOTE — Clinical Social Work Note (Signed)
CSW noted patient has transferred from 1C to ICU and is now on vent. Will continue to follow. York SpanielMonica Shelbe Haglund MSW,LCSW [phone]

## 2015-01-24 ENCOUNTER — Inpatient Hospital Stay: Payer: Medicare Other

## 2015-01-24 DIAGNOSIS — I959 Hypotension, unspecified: Secondary | ICD-10-CM

## 2015-01-24 DIAGNOSIS — J9621 Acute and chronic respiratory failure with hypoxia: Secondary | ICD-10-CM

## 2015-01-24 DIAGNOSIS — I469 Cardiac arrest, cause unspecified: Secondary | ICD-10-CM

## 2015-01-24 LAB — CBC
HCT: 26.4 % — ABNORMAL LOW (ref 35.0–47.0)
Hemoglobin: 8.5 g/dL — ABNORMAL LOW (ref 12.0–16.0)
MCH: 26.9 pg (ref 26.0–34.0)
MCHC: 32.1 g/dL (ref 32.0–36.0)
MCV: 84 fL (ref 80.0–100.0)
PLATELETS: 296 10*3/uL (ref 150–440)
RBC: 3.14 MIL/uL — AB (ref 3.80–5.20)
RDW: 17.4 % — AB (ref 11.5–14.5)
WBC: 14.4 10*3/uL — ABNORMAL HIGH (ref 3.6–11.0)

## 2015-01-24 LAB — COMPREHENSIVE METABOLIC PANEL
ALBUMIN: 2 g/dL — AB (ref 3.5–5.0)
ALK PHOS: 100 U/L (ref 38–126)
ALT: 50 U/L (ref 14–54)
AST: 51 U/L — AB (ref 15–41)
Anion gap: 6 (ref 5–15)
BILIRUBIN TOTAL: 0.9 mg/dL (ref 0.3–1.2)
BUN: 19 mg/dL (ref 6–20)
CALCIUM: 8 mg/dL — AB (ref 8.9–10.3)
CO2: 17 mmol/L — ABNORMAL LOW (ref 22–32)
CREATININE: 0.86 mg/dL (ref 0.44–1.00)
Chloride: 119 mmol/L — ABNORMAL HIGH (ref 101–111)
GFR calc Af Amer: >60 mL/min (ref >60)
GFR, EST NON AFRICAN AMERICAN: 57 mL/min — AB (ref >60)
Glucose, Bld: 200 mg/dL — ABNORMAL HIGH (ref 65–99)
POTASSIUM: 3.7 mmol/L (ref 3.5–5.1)
Sodium: 142 mmol/L (ref 135–145)
TOTAL PROTEIN: 5.1 g/dL — AB (ref 6.5–8.1)

## 2015-01-24 LAB — PHOSPHORUS: Phosphorus: 4.2 mg/dL (ref 2.5–4.6)

## 2015-01-24 LAB — BLOOD GAS, ARTERIAL
ALLENS TEST (PASS/FAIL): POSITIVE — AB
Acid-base deficit: 6 mmol/L — ABNORMAL HIGH (ref 0.0–2.0)
BICARBONATE: 16 meq/L — AB (ref 21.0–28.0)
FIO2: 0.35
O2 Saturation: 98.8 %
PEEP: 5 cmH2O
PH ART: 7.45 (ref 7.350–7.450)
Patient temperature: 37
Pressure support: 10 cmH2O
pCO2 arterial: 23 mmHg — ABNORMAL LOW (ref 32.0–48.0)
pO2, Arterial: 118 mmHg — ABNORMAL HIGH (ref 83.0–108.0)

## 2015-01-24 LAB — GLUCOSE, CAPILLARY
GLUCOSE-CAPILLARY: 169 mg/dL — AB (ref 65–99)
GLUCOSE-CAPILLARY: 144 mg/dL — AB (ref 65–99)
Glucose-Capillary: 168 mg/dL — ABNORMAL HIGH (ref 65–99)
Glucose-Capillary: 156 mg/dL — ABNORMAL HIGH (ref 65–99)

## 2015-01-24 LAB — POTASSIUM: Potassium: 4.8 mmol/L (ref 3.5–5.1)

## 2015-01-24 LAB — MAGNESIUM: MAGNESIUM: 1.6 mg/dL — AB (ref 1.7–2.4)

## 2015-01-24 LAB — HEMOGLOBIN A1C: HEMOGLOBIN A1C: 7.1 % — AB (ref 4.0–6.0)

## 2015-01-24 MED ORDER — INSULIN ASPART 100 UNIT/ML ~~LOC~~ SOLN
0.0000 [IU] | SUBCUTANEOUS | Status: DC
  Administered 2015-01-24: 1 [IU] via SUBCUTANEOUS
  Administered 2015-01-24 (×3): 2 [IU] via SUBCUTANEOUS
  Administered 2015-01-25: 1 [IU] via SUBCUTANEOUS
  Administered 2015-01-25: 2 [IU] via SUBCUTANEOUS
  Administered 2015-01-25: 1 [IU] via SUBCUTANEOUS
  Administered 2015-01-25: 2 [IU] via SUBCUTANEOUS
  Administered 2015-01-25: 1 [IU] via SUBCUTANEOUS
  Administered 2015-01-26: 2 [IU] via SUBCUTANEOUS
  Administered 2015-01-26: 1 [IU] via SUBCUTANEOUS
  Administered 2015-01-26 (×2): 2 [IU] via SUBCUTANEOUS
  Administered 2015-01-26: 1 [IU] via SUBCUTANEOUS
  Administered 2015-01-26: 2 [IU] via SUBCUTANEOUS
  Administered 2015-01-27 (×2): 1 [IU] via SUBCUTANEOUS
  Administered 2015-01-27 (×2): 2 [IU] via SUBCUTANEOUS
  Administered 2015-01-27 – 2015-01-28 (×2): 1 [IU] via SUBCUTANEOUS
  Administered 2015-01-28 (×3): 2 [IU] via SUBCUTANEOUS
  Administered 2015-01-28: 1 [IU] via SUBCUTANEOUS
  Administered 2015-01-28: 2 [IU] via SUBCUTANEOUS
  Administered 2015-01-29: 1 [IU] via SUBCUTANEOUS
  Administered 2015-01-29 (×2): 2 [IU] via SUBCUTANEOUS
  Administered 2015-01-29 (×2): 1 [IU] via SUBCUTANEOUS
  Administered 2015-01-29: 2 [IU] via SUBCUTANEOUS
  Administered 2015-01-30 (×2): 1 [IU] via SUBCUTANEOUS
  Administered 2015-01-30: 2 [IU] via SUBCUTANEOUS
  Administered 2015-01-30 (×3): 1 [IU] via SUBCUTANEOUS
  Administered 2015-01-30 – 2015-02-01 (×7): 2 [IU] via SUBCUTANEOUS
  Administered 2015-02-01 (×6): 1 [IU] via SUBCUTANEOUS
  Administered 2015-02-02: 2 [IU] via SUBCUTANEOUS
  Filled 2015-01-24: qty 2
  Filled 2015-01-24 (×3): qty 1
  Filled 2015-01-24: qty 2
  Filled 2015-01-24: qty 1
  Filled 2015-01-24 (×2): qty 2
  Filled 2015-01-24: qty 1
  Filled 2015-01-24: qty 2
  Filled 2015-01-24 (×2): qty 1
  Filled 2015-01-24 (×2): qty 2
  Filled 2015-01-24 (×2): qty 1
  Filled 2015-01-24 (×4): qty 2
  Filled 2015-01-24 (×2): qty 1
  Filled 2015-01-24 (×3): qty 2
  Filled 2015-01-24 (×2): qty 1
  Filled 2015-01-24: qty 2
  Filled 2015-01-24: qty 1
  Filled 2015-01-24 (×2): qty 2
  Filled 2015-01-24: qty 1
  Filled 2015-01-24 (×3): qty 2
  Filled 2015-01-24: qty 1
  Filled 2015-01-24 (×3): qty 2
  Filled 2015-01-24: qty 1
  Filled 2015-01-24 (×3): qty 2
  Filled 2015-01-24 (×2): qty 1
  Filled 2015-01-24 (×2): qty 2
  Filled 2015-01-24 (×5): qty 1
  Filled 2015-01-24: qty 2

## 2015-01-24 MED ORDER — MAGNESIUM SULFATE 2 GM/50ML IV SOLN
2.0000 g | Freq: Once | INTRAVENOUS | Status: AC
  Administered 2015-01-24: 2 g via INTRAVENOUS
  Filled 2015-01-24: qty 50

## 2015-01-24 MED ORDER — POTASSIUM PHOSPHATES 15 MMOLE/5ML IV SOLN
45.0000 mmol | Freq: Once | INTRAVENOUS | Status: AC
  Administered 2015-01-24: 45 mmol via INTRAVENOUS
  Filled 2015-01-24: qty 15

## 2015-01-24 NOTE — Progress Notes (Signed)
[facility] - [place] at [facility]   PATIENT NAME: Christine Vance    MR#:  [id number]  DATE OF BIRTH:  05-11-1920  SUBJECTIVE:  CHIEF COMPLAINT:   Chief Complaint  Patient presents with  . Urinary Tract Infection    unresponsive   Sent from NH- found with UTI, malnourished, and dehydrated.  Patient coded ,PEA , arrest time 8-10 min yesterday and was moved to intensive care unit and got intubated 1/14. Currently not on any sedation.  No family members at bedside  REVIEW OF SYSTEMS:   Limited review of systems Review of Systems  Unable to perform ROS   DRUG ALLERGIES:   Allergies  Allergen Reactions  . Codeine Sulfate Other (See Comments)    unknown  . Ivp Dye [Iodinated Diagnostic Agents] Other (See Comments)    unknown  . Lansoprazole Other (See Comments)    unknown  . Losartan Potassium-Hctz Other (See Comments)    unknown  . Penicillins Other (See Comments)    unknown  . Pravachol [Pravastatin] Other (See Comments)    unknown  . Prednisone Other (See Comments)    unknown  . Sulfa Antibiotics Other (See Comments)    unknown  . Sulfonamide Derivatives Other (See Comments)    unknown    VITALS:  Blood pressure 131/62, pulse 77, temperature 99.5 F (37.5 C), temperature source Rectal, resp. rate 35, height 4\' 8"  (1.422 m), weight 44.1 kg (97 lb 3.6 oz), SpO2 100 %.  PHYSICAL EXAMINATION:   GENERAL: 82 y.o.-year-old patient lying in the bed with no acute distress.  EYES: Pupils equal, round, reactive to light sluggishly. No scleral icterus.   HEENT: Head atraumatic, normocephalic. ET tube intact . Positive gag reflex per RN report NECK: Supple, no jugular venous distention. No thyroid enlargement, no tenderness.  LUNGS: Diminished  breath sounds bilaterally, no wheezing, rales, rhonchi. No use of accessory muscles of respiration.  CARDIOVASCULAR: S1, S2 normal. No murmurs, rubs, or gallops.  ABDOMEN: Soft, nontender,  nondistended. Bowel sounds present. No organomegaly or mass.  EXTREMITIES: No pedal edema, cyanosis, or clubbing. + 2 pedal & radial pulses b/l. Diffuse muscle wasting NEUROLOGIC: Intubated PSYCHIATRIC: The patient is intubated, not on  sedatives  Physical Exam LABORATORY PANEL:   CBC  Recent Labs Lab 01/24/15 0521  WBC 14.4*  HGB 8.5*  HCT 26.4*  PLT 296   ------------------------------------------------------------------------------------------------------------------  Chemistries   Recent Labs Lab 01/24/15 0521  NA 142  K 3.7  CL 119*  CO2 17*  GLUCOSE 200*  BUN 19  CREATININE 0.86  CALCIUM 8.0*  MG 1.6*  AST 51*  ALT 50  ALKPHOS 100  BILITOT 0.9   ------------------------------------------------------------------------------------------------------------------  Cardiac Enzymes No results for input(s): TROPONINI in the last 168 hours. ------------------------------------------------------------------------------------------------------------------  RADIOLOGY:  Dg Chest 1 View  01/22/2015  CLINICAL DATA:  Acute respiratory failure, former smoker. EXAM: CHEST 1 VIEW COMPARISON:  CT 02-12-2015 FINDINGS: Endotracheal tube 2.5 cm from carina. Elevation of the RIGHT hemidiaphragm is new. Associated RIGHT lower lobe atelectasis. No pulmonary edema. No pneumothorax. NG tube extends the stomach. Rounded density over the LEFT lung base likely represents nipple shadow. IMPRESSION: 1. Endotracheal tube in good position. 2. New elevation of the RIGHT hemidiaphragm with associated atelectasis. Electronically Signed   By: Genevive Bi M.D.   On: 01/22/2015 15:23   Dg Abd 1 View  01/22/2015  CLINICAL DATA:  OG tube placement. EXAM: ABDOMEN - 1 VIEW COMPARISON:  None. FINDINGS: Orogastric tube extends through  the GE junction into the proximal stomach. Mild gaseous distention of the bowel is noted. There is moderate increased stool distending the rectum. No overt obstruction.  IMPRESSION: 1. Orogastric tube passes below the diaphragm into the proximal stomach. 2. Mild gaseous distention of the bowel without convincing obstruction. Moderate increased stool in the rectum. Electronically Signed   By: Amie Portlandavid  Ormond M.D.   On: 01/22/2015 15:24   Dg Chest Port 1 View  01/24/2015  CLINICAL DATA: Respiratory failure. EXAM: PORTABLE CHEST 1 VIEW COMPARISON:  01/23/2015. FINDINGS: Endotracheal tube, NG tube, left PICC line in good anatomic position. Mediastinum hilar structures normal. Mild right base subsegmental atelectasis and or infiltrate. No pleural effusion or pneumothorax. IMPRESSION: 1. Lines and tubes stable position. 2. Mild right base subsegmental atelectasis. Electronically Signed   By: Maisie Fushomas  Register   On: 01/24/2015 07:43   Dg Chest Port 1 View  01/23/2015  CLINICAL DATA:  Readjustment of PICC line EXAM: PORTABLE CHEST 1 VIEW COMPARISON:  Portable exam 1636 hours compared to 1246 hours FINDINGS: Tip of endotracheal tube projects 2.5 cm above carina. LEFT arm PICC line tip projects over SVC above cavoatrial junction. Normal heart size, mediastinal contours and pulmonary vascularity. Lungs clear. No pleural effusion or pneumothorax. Bones demineralized. IMPRESSION: Tip over LEFT arm PICC line projects over SVC. No acute infiltrate. Electronically Signed   By: Ulyses SouthwardMark  Boles M.D.   On: 01/23/2015 17:02   Dg Chest Port 1 View  01/23/2015  CLINICAL DATA:  Central line placement. EXAM: PORTABLE CHEST 1 VIEW COMPARISON:  01/23/2015 at 9:51 a.m. FINDINGS: Endotracheal tube has been pulled back with tip 3.1 cm above the carina. Nasogastric tube courses through the region of the stomach and off the inferior portion of the film as tip is not visualized. There has been placement of a left-sided PICC line with tip over the right atrium just below the cavoatrial junction. This could be pulled back approximately 3-4 cm. Patient is moderately rotated to the right. Lungs are adequately  inflated without consolidation or effusion. No pneumothorax. Cardiomediastinal silhouette and remainder of the exam is unchanged. IMPRESSION: No acute cardiopulmonary disease. Tubes and lines as described. Note that the endotracheal tube has been adjusted and has tip 3.1 cm above the carina. Right-sided PICC line has tip over the right atrium just below the cavoatrial junction. This could be pulled back 3-4 cm. These results were called by telephone at the time of interpretation on 01/23/2015 at 1:55 pm to patient's nurse, Adron BeneSarah Moore, who verbally acknowledged these results. Electronically Signed   By: Elberta Fortisaniel  Boyle M.D.   On: 01/23/2015 13:55   Dg Chest Port 1 View  01/23/2015  CLINICAL DATA:  Acute respiratory failure, on ventilator, suspected aspiration, Alzheimer's, former smoker EXAM: PORTABLE CHEST 1 VIEW COMPARISON:  Portable exam 0952 hours compared to 01/22/2015 FINDINGS: Tip of endotracheal tube is within 1 cm of carina, directed toward RIGHT mainstem bronchus, recommend withdrawal 2 cm. Nasogastric tube coiled in stomach. Normal heart size, mediastinal contours and pulmonary vascularity. Atherosclerotic calcification aorta. Lungs clear. No pleural effusion or pneumothorax. IMPRESSION: Lungs clear. Improved aeration RIGHT base since previous exam. Tip of endotracheal tube is within 1 cm carina directed towards RIGHT mainstem bronchus; recommend withdrawal 2 cm. [facility] 01/23/2015 at 1202 hours. Electronically Signed   By: Ulyses SouthwardMark  Boles M.D.   On: 01/23/2015 12:03    ASSESSMENT AND PLAN:   Active Problems:   UTI (lower urinary tract infection)  Protein-calorie malnutrition, severe  *Acute hypoxic respiratory failure secondary to CODE BLUE-PEA Patient was transferred to intensive care unit and intubated -on mechanical ventilator day2 vent to management per protocol, on spontaneous mode today. Wean off as tolerated Appreciate critical care recommendations  * Acute  encephalopathy secondary to sepsis secondary to possible aspiration pneumonia, superimposed on acute cystitis from ESBL/failure to thrive with poor by mouth intake Continue broad-spectrum IV antibiotics Pressors to support her blood pressure as needed IV fluids Follow-up on the bloodcultures Chest x-ray with elevated right hemidiaphragm- follow-up with infectious disease Needs PICC line for IV antibiotics, f/u  with Dr. Sampson Goon 's recommendations  Prior to CODE BLUE- per Cx- she has ESBL- continue Ertapenem and vancomycin.   Repeat swallow evaluation done 1/13  and patient is started on pure diet,  currently patient is nothing by mouth  following CODE BLUE.  * Hyponatremia with severe dehydration-resolved with IV fluids     * Severe malnutrition  Patient is started on pure diet with honey thick liquids as recommended by speech pathology but  following CODE BLUE on 01/22/2015 patient was made nothing by mouth , providing orogastric feeds with vital 1.2 at 45 cc/h   the patient on Megace  Calorie count by dietician   palliative care Dr. Orvan Falconer d/w pts daughter - in - law , [facility]) need to get papers to save in pts file wants aggressive measures and full code status. She will consider PEG tube if pt's condition doesn't improve. Reconsulted palliative care as overall  prognosis is very poor  * ARF secondary to dehydration from poor by mouth intake Resolved with IV fluids  poor prognosis   All the records are reviewed and case discussed with Care Management/Social Workerr. Palliative care Unable to reach healthcare power of attorney   CODE STATUS: FUll  TOTAL CRITICAL CARE TIME TAKING CARE OF THIS PATIENT: 35 minutes.      POSSIBLE D/C IN ?DAYS, DEPENDING ON CLINICAL CONDITION.   Ramonita Lab M.D on 01/24/2015   Between 7am to 6pm - Pager - [phone]   After 6pm go to [email or web address] - password [email or web address] [facility]  [place] [facility]  Office   [phone]  CC: Primary care physician; Keane Police, MD  Note: This dictation was prepared with Dragon dictation along with smaller phrase technology. Any transcriptional errors that result from this process are unintentional.

## 2015-01-24 NOTE — Clinical Social Work Note (Signed)
Upon chart review, CSW [facility] and [facility] is [facility] care resident there. CSW will complete full assessment. York SpanielMonica Vallie Fayette MSW,LCSW [phone]

## 2015-01-24 NOTE — Progress Notes (Signed)
[facility] Progess Note    ASSESSMENT/PLAN    [age] year female past medical history of dementia, recurrent UTI, currently with ESBL UTI, status post CODE BLUE.  PULMONARY OETT Acute respiratory failure PEA arrest times 8-10 minutes Mechanical ventilation Hypoxia Possible right hemi-diaphragm elevation Sepsis P:  -Secondary to PEA arrest, now becoming a bit more responsive -Continue mechanical ventilation-maintain O2 sats greater than 88% -ABG as needed -Wean mechanical ventilation, FiO2 as tolerated -Recheck chest x-ray in the morning -Continue with current antibiotics and hemodynamic support  CARDIOVASCULAR CODE BLUE-PEA arrest Hypertension P:  -Possibly secondary to sepsis, ESBL UTI -Continue with current vasopressors, wean as tolerated -Continue with hemodynamic monitoring  RENAL -Electrolytes -ICU electrolyte replacement protocol  GASTROINTESTINAL -OGT -Continue tube feeds.  -GI prophylaxis  HEMATOLOGIC -Monitor CBC line-leukocytosis secondary to stress leukemoid reaction along with sepsis  INFECTIOUS ESBL UTI P:  -Continue current antibiotics -Infectious disease currently following along -Patient will require at least 7 days of IV antibiotics, will place consult for PICC line  ENDOCRINE -ICU hypo/hyperglycemia protocol  NEUROLOGIC A: Encephalopathy-metabolic, worsening dementia; multifactorial P:  RASS goal: 0 -Multifactorial reasons for encephalopathy including dementia, sepsis, recent PEA arrests. -Review of chart shows that patient has been slowly declining and may of had continued progression of her dementia, her healthcare power of attorney has verified understanding of patient's clinical status, and we'll continue with full CODE STATUS. -Currently patient is withdrawing to pain and tracking extremities decide of touch and pain, she is not tracking with her eyes today. Patient is currently not requiring any type  of sedation or agitation medications.  SOCIAL - FULL CODE - Palliative Care signed off, see note on 1/13     ---------------------------------------   ----------------------------------------   Name: Vista DeckLorraine M Juenger MRN: [id number] DOB: 09/08/1920    ADMISSION DATE:  01/20/2015    CHIEF COMPLAINT:  Dyspnea   STUDIES:  -   SUBJECTIVE:   Pt currently on the ventilator, can not provide history or review of systems.     VITAL SIGNS: Temp:  [99.3 F (37.4 C)-100.2 F (37.9 C)] 99.7 F (37.6 C) (01/16 1100) Pulse Rate:  [43-102] 80 (01/16 1100) Resp:  [16-22] 16 (01/16 1100) BP: (100-131)/(46-69) 122/54 mmHg (01/16 1100) SpO2:  [87 %-100 %] 100 % (01/16 1100) FiO2 (%):  [35 %] 35 % (01/16 0829) Weight:  [97 lb 3.6 oz (44.1 kg)] 97 lb 3.6 oz (44.1 kg) (01/16 0500) HEMODYNAMICS:   VENTILATOR SETTINGS: Vent Mode:  [-] PRVC FiO2 (%):  [35 %] 35 % Set Rate:  [14 bmp] 14 bmp Vt Set:  [450 mL] 450 mL PEEP:  [5 cmH20] 5 cmH20 INTAKE / OUTPUT:  Intake/Output Summary (Last 24 hours) at 01/24/15 1135 Last data filed at 01/24/15 1100  Gross per 24 hour  Intake 3105.93 ml  Output    290 ml  Net 2815.93 ml    PHYSICAL EXAMINATION: Physical Examination:   VS: BP 122/54 mmHg  Pulse 80  Temp(Src) 99.7 F (37.6 C) (Rectal)  Resp 16  Ht 4\' 8"  (1.422 m)  Wt 97 lb 3.6 oz (44.1 kg)  BMI 21.81 kg/m2  SpO2 100%  General Appearance: No distress  Neuro:without focal findings, mental status normal. HEENT: PERRLA, Pulmonary: normal breath sounds   CardiovascularNormal S1,S2.  No m/r/g.   Abdomen: Benign, Soft, non-tender. Renal:  No costovertebral tenderness  GU:  Not performed at this time. Endocrine: No evident thyromegaly. Skin:   warm, no rashes, no ecchymosis  Extremities:  normal, no cyanosis, clubbing.   LABS:   LABORATORY PANEL:   CBC  Recent Labs Lab 01/24/15 0521  WBC 14.4*  HGB 8.5*  HCT 26.4*  PLT 296    Chemistries   Recent Labs Lab  01/24/15 0521  NA 142  K 3.7  CL 119*  CO2 17*  GLUCOSE 200*  BUN 19  CREATININE 0.86  CALCIUM 8.0*  MG 1.6*  PHOS 1.0*  AST 51*  ALT 50  ALKPHOS 100  BILITOT 0.9     Recent Labs Lab 01/21/15 0732 01/22/15 1529  GLUCAP 148* 258*    Recent Labs Lab 01/22/15 1540 01/22/15 1715 01/23/15 1001  PHART 7.23* 7.38 7.59*  PCO2ART 33 27* 16*  PO2ART 294* 122* 159*    Recent Labs Lab 01/24/15 0521  AST 51*  ALT 50  ALKPHOS 100  BILITOT 0.9  ALBUMIN 2.0*    Cardiac Enzymes No results for input(s): TROPONINI in the last 168 hours.  RADIOLOGY:  Dg Chest 1 View  01/22/2015  CLINICAL DATA:  Acute respiratory failure, former smoker. EXAM: CHEST 1 VIEW COMPARISON:  CT 02/07/2015 FINDINGS: Endotracheal tube 2.5 cm from carina. Elevation of the RIGHT hemidiaphragm is new. Associated RIGHT lower lobe atelectasis. No pulmonary edema. No pneumothorax. NG tube extends the stomach. Rounded density over the LEFT lung base likely represents nipple shadow. IMPRESSION: 1. Endotracheal tube in good position. 2. New elevation of the RIGHT hemidiaphragm with associated atelectasis. Electronically Signed   By: Genevive Bi M.D.   On: 01/22/2015 15:23   Dg Abd 1 View  01/22/2015  CLINICAL DATA:  OG tube placement. EXAM: ABDOMEN - 1 VIEW COMPARISON:  None. FINDINGS: Orogastric tube extends through the GE junction into the proximal stomach. Mild gaseous distention of the bowel is noted. There is moderate increased stool distending the rectum. No overt obstruction. IMPRESSION: 1. Orogastric tube passes below the diaphragm into the proximal stomach. 2. Mild gaseous distention of the bowel without convincing obstruction. Moderate increased stool in the rectum. Electronically Signed   By: Amie Portland M.D.   On: 01/22/2015 15:24   Dg Chest Port 1 View  01/24/2015  CLINICAL DATA: Respiratory failure. EXAM: PORTABLE CHEST 1 VIEW COMPARISON:  01/23/2015. FINDINGS: Endotracheal tube, NG tube, left  PICC line in good anatomic position. Mediastinum hilar structures normal. Mild right base subsegmental atelectasis and or infiltrate. No pleural effusion or pneumothorax. IMPRESSION: 1. Lines and tubes stable position. 2. Mild right base subsegmental atelectasis. Electronically Signed   By: Maisie Fus  Register   On: 01/24/2015 07:43   Dg Chest Port 1 View  01/23/2015  CLINICAL DATA:  Readjustment of PICC line EXAM: PORTABLE CHEST 1 VIEW COMPARISON:  Portable exam 1636 hours compared to 1246 hours FINDINGS: Tip of endotracheal tube projects 2.5 cm above carina. LEFT arm PICC line tip projects over SVC above cavoatrial junction. Normal heart size, mediastinal contours and pulmonary vascularity. Lungs clear. No pleural effusion or pneumothorax. Bones demineralized. IMPRESSION: Tip over LEFT arm PICC line projects over SVC. No acute infiltrate. Electronically Signed   By: Ulyses Southward M.D.   On: 01/23/2015 17:02   Dg Chest Port 1 View  01/23/2015  CLINICAL DATA:  Central line placement. EXAM: PORTABLE CHEST 1 VIEW COMPARISON:  01/23/2015 at 9:51 a.m. FINDINGS: Endotracheal tube has been pulled back with tip 3.1 cm above the carina. Nasogastric tube courses through the region of the stomach and off the inferior portion of the film as tip is not visualized. There has been  placement of a left-sided PICC line with tip over the right atrium just below the cavoatrial junction. This could be pulled back approximately 3-4 cm. Patient is moderately rotated to the right. Lungs are adequately inflated without consolidation or effusion. No pneumothorax. Cardiomediastinal silhouette and remainder of the exam is unchanged. IMPRESSION: No acute cardiopulmonary disease. Tubes and lines as described. Note that the endotracheal tube has been adjusted and has tip 3.1 cm above the carina. Right-sided PICC line has tip over the right atrium just below the cavoatrial junction. This could be pulled back 3-4 cm. These results were called by  telephone at the time of interpretation on 01/23/2015 at 1:55 pm to patient's nurse, Adron Bene, who verbally acknowledged these results. Electronically Signed   By: Elberta Fortis M.D.   On: 01/23/2015 13:55   Dg Chest Port 1 View  01/23/2015  CLINICAL DATA:  Acute respiratory failure, on ventilator, suspected aspiration, Alzheimer's, former smoker EXAM: PORTABLE CHEST 1 VIEW COMPARISON:  Portable exam 0952 hours compared to 01/22/2015 FINDINGS: Tip of endotracheal tube is within 1 cm of carina, directed toward RIGHT mainstem bronchus, recommend withdrawal 2 cm. Nasogastric tube coiled in stomach. Normal heart size, mediastinal contours and pulmonary vascularity. Atherosclerotic calcification aorta. Lungs clear. No pleural effusion or pneumothorax. IMPRESSION: Lungs clear. Improved aeration RIGHT base since previous exam. Tip of endotracheal tube is within 1 cm carina directed towards RIGHT mainstem bronchus; recommend withdrawal 2 cm. Findings called to [facility] RN in ICU on 01/23/2015 at 1202 hours. Electronically Signed   By: Ulyses Southward M.D.   On: 01/23/2015 12:03       --Wells Guiles, MD.  Pager [phone] [facility]   Santiago Glad, M.D.  Stephanie Acre, M.D.  Billy Fischer, M.D  Critical Care Attestation.  I have personally obtained a history, examined the patient, evaluated laboratory and imaging results, formulated the assessment and plan and placed orders. The Patient requires high complexity decision making for assessment and support, frequent evaluation and titration of therapies, application of advanced monitoring technologies and extensive interpretation of multiple databases. The patient has critical illness that could lead imminently to failure of 1 or more organ systems and requires the highest level of physician preparedness to intervene.  Critical Care Time devoted to patient care services described in this note is 35 minutes and is exclusive of time  spent in procedures.

## 2015-01-24 NOTE — Progress Notes (Signed)
Speech Therapy Note: a code blue was called on pt Saturday PM and pt was transferred to CCU. Pt is now orally intubated on full vent support and not appropriate for ST services. MD to reconsult when pt's medical status improves and pt can be extubated and appropriate for BSE.

## 2015-01-24 NOTE — Progress Notes (Signed)
Pharmacy Consult for Electrolyte Monitoring  Allergies  Allergen Reactions  . Codeine Sulfate Other (See Comments)    unknown  . Ivp Dye [Iodinated Diagnostic Agents] Other (See Comments)    unknown  . Lansoprazole Other (See Comments)    unknown  . Losartan Potassium-Hctz Other (See Comments)    unknown  . Penicillins Other (See Comments)    unknown  . Pravachol [Pravastatin] Other (See Comments)    unknown  . Prednisone Other (See Comments)    unknown  . Sulfa Antibiotics Other (See Comments)    unknown  . Sulfonamide Derivatives Other (See Comments)    unknown    Patient Measurements: Height: 4\' 8"  (142.2 cm) Weight: 97 lb 3.6 oz (44.1 kg) IBW/kg (Calculated) : 36.3  Vital Signs: Temp: 99.7 F (37.6 C) (01/16 0900) Temp Source: Rectal (01/16 0800) BP: 105/47 mmHg (01/16 0900) Pulse Rate: 86 (01/16 0900) Intake/Output from previous day: 01/15 0701 - 01/16 0700 In: 2748.2 [I.V.:1822.9; NG/GT:875.3; IV Piggyback:50] Out: 370 [Urine:370] Intake/Output from this shift: Total I/O In: 245 [I.V.:150; NG/GT:45; IV Piggyback:50] Out: -   Labs:  Recent Labs  01/23/15 0523 01/23/15 0950 01/24/15 0521  WBC 15.4* 14.5* 14.4*  HGB 9.9* 9.3* 8.5*  HCT 31.8* 30.0* 26.4*  PLT 383 334 296     Recent Labs  01/22/15 0503 01/22/15 1550 01/23/15 0523 01/24/15 0521  NA 137  --  140 142  K 4.1  --  3.4* 3.7  CL 109  --  115* 119*  CO2 21*  --  15* 17*  GLUCOSE 156*  --  188* 200*  BUN 15  --  19 19  CREATININE 0.80  --  1.02* 0.86  CALCIUM 8.7*  --  8.2* 8.0*  MG  --   --   --  1.6*  PHOS  --   --   --  1.0*  PROT  --   --   --  5.1*  ALBUMIN  --   --   --  2.0*  AST  --   --   --  51*  ALT  --   --   --  50  ALKPHOS  --   --   --  100  BILITOT  --   --   --  0.9  TRIG  --  100  --   --    Estimated Creatinine Clearance: 26 mL/min (by C-G formula based on Cr of 0.86).    Recent Labs  01/22/15 1529  GLUCAP 258*    Medical History: Past Medical  History  Diagnosis Date  . Edema leg   . Constipation   . LBBB (left bundle branch block)   . Allergic rhinitis   . Anxiety   . Alzheimer's dementia     Medications:  Scheduled:  . antiseptic oral rinse  7 mL Mouth Rinse QID  . chlorhexidine gluconate  15 mL Mouth Rinse BID  . enoxaparin (LOVENOX) injection  30 mg Subcutaneous Q24H  . ertapenem  0.5 g Intravenous Q24H  . free water  200 mL Per Tube 3 times per day  . insulin aspart  0-9 Units Subcutaneous 6 times per day  . megestrol  400 mg Oral BID  . pantoprazole (PROTONIX) IV  40 mg Intravenous Q24H  . potassium phosphate IVPB (mmol)  45 mmol Intravenous Once  . sodium chloride  3 mL Intravenous Q12H  . Vancomycin  750 mg Intravenous Q36H   Infusions:  . 0.9 % NaCl  with KCl 20 mEq / L 75 mL/hr at 01/24/15 0700  . feeding supplement (VITAL AF 1.2 CAL) 1,000 mL (01/24/15 0700)  . norepinephrine (LEVOPHED) Adult infusion Stopped (01/23/15 1315)  . propofol (DIPRIVAN) infusion Stopped (01/22/15 1627)    Assessment: Pharmacy consulted to assist in managing electrolytes in this 82 y/o F admitted with UTI and respiratory failure due to code blue, PEA.   Plan:  Phos and mg are low so will replace with magnesium sulfate 2 g iv once and potassium phosphate 45 mmol iv once. Will recheck phos at 1800 to determine need for further replacement.   Luisa HartChristy, Biannca Scantlin D 01/24/2015,11:03 AM

## 2015-01-24 NOTE — Progress Notes (Signed)
Nutrition Follow-up  DOCUMENTATION CODES:   Severe malnutrition in context of chronic illness  INTERVENTION:  EN: continue vital 1.2 at 5145ml/hr, pt goal rate.   Coordination of care: will resume calorie count once able to take po diet.   NUTRITION DIAGNOSIS:   Inadequate oral intake related to inability to eat as evidenced by NPO status.    GOAL:   Patient will meet greater than or equal to 90% of their needs    MONITOR:    (Energy Intake, Anthropometrics, Electrolyte and rneal Profile, Digestive System, Glucose Profile)  REASON FOR ASSESSMENT:   Consult Poor PO  ASSESSMENT:   Pt admitted with AMS and UTI. Pt with h/o dementia, dysphagia and was on honey thickened liquids PTA. Palliative Care consulted.  Pt remains on vent, family wanting full code at this time   Current Nutrition: tolerating vital 1.2 at 1245ml/hr via OG tube   Gastrointestinal Profile: Last BM: 1/16   Scheduled Medications:  . antiseptic oral rinse  7 mL Mouth Rinse QID  . chlorhexidine gluconate  15 mL Mouth Rinse BID  . enoxaparin (LOVENOX) injection  30 mg Subcutaneous Q24H  . ertapenem  0.5 g Intravenous Q24H  . free water  200 mL Per Tube 3 times per day  . insulin aspart  0-9 Units Subcutaneous 6 times per day  . megestrol  400 mg Oral BID  . pantoprazole (PROTONIX) IV  40 mg Intravenous Q24H  . potassium phosphate IVPB (mmol)  45 mmol Intravenous Once  . sodium chloride  3 mL Intravenous Q12H  . Vancomycin  750 mg Intravenous Q36H    Continuous Medications:  . 0.9 % NaCl with KCl 20 mEq / L 75 mL/hr at 01/24/15 0700  . feeding supplement (VITAL AF 1.2 CAL) 1,000 mL (01/24/15 0700)  . norepinephrine (LEVOPHED) Adult infusion Stopped (01/23/15 1315)  . propofol (DIPRIVAN) infusion Stopped (01/22/15 1627)     Electrolyte/Renal Profile and Glucose Profile:   Recent Labs Lab 01/22/15 0503 01/23/15 0523 01/24/15 0521  NA 137 140 142  K 4.1 3.4* 3.7  CL 109 115* 119*  CO2  21* 15* 17*  BUN 15 19 19   CREATININE 0.80 1.02* 0.86  CALCIUM 8.7* 8.2* 8.0*  MG  --   --  1.6*  PHOS  --   --  1.0*  GLUCOSE 156* 188* 200*   Protein Profile:  Recent Labs Lab 01/24/15 0521  ALBUMIN 2.0*      Weight Trend since Admission: Filed Weights   01/22/15 1500 01/23/15 0321 01/24/15 0500  Weight: 90 lb 2.7 oz (40.9 kg) 92 lb 9.5 oz (42 kg) 97 lb 3.6 oz (44.1 kg)      Diet Order:   NPO  Skin:   reviewed  Height:   Ht Readings from Last 1 Encounters:  September 07, 2015 4\' 8"  (1.422 m)    Weight:   Wt Readings from Last 1 Encounters:  01/24/15 97 lb 3.6 oz (44.1 kg)    Ideal Body Weight:     BMI:  Body mass index is 21.81 kg/(m^2).  Estimated Nutritional Needs:   Kcal:  TEE 1340 kcals/d (Ve 16, Tmax 38.3)  Protein:  (1.2-2.0 g/kg) 50-84 g/d)  Fluid:  (25-4033ml/kg) 1050-124260ml/d  EDUCATION NEEDS:   No education needs identified at this time  HIGH Care Level  Emmalena Canny B. Freida BusmanAllen, RD, LDN [phone] (pager) Weekend/On-Call pager [phone])

## 2015-01-24 NOTE — Progress Notes (Signed)
Pharmacy Antibiotic Follow-up Note  Christine Vance is a 82 y.o. year-old female admitted on 01/09/2015.  The patient is currently on day 6 of Ertapenem 500mg  Q24h for ESBL UTI and Day 3 of Vancomycin for sepsis.  Assessment/Plan: Will continue Vancomycin  750mg  IV Q36h. Will not schedule a trough at this point but will continue to watch renal function closely and check levels as clinically indicated. Discussed duration of therapy for ertapenem with Dr. Nicholos Johnsamachandran who wants a total of 10 days of therapy. Will continue Ertapenem 500 mg iv q 24 hours for 4 more days.    ID following.  Temp (24hrs), Avg:99.9 F (37.7 C), Min:99.3 F (37.4 C), Max:100.2 F (37.9 C)   Recent Labs Lab 01/18/15 0451 01/21/15 0446 01/23/15 0523 01/23/15 0950 01/24/15 0521  WBC 10.4 7.0 15.4* 14.5* 14.4*     Recent Labs Lab 01/19/15 0621 01/21/15 0446 01/22/15 0503 01/23/15 0523 01/24/15 0521  CREATININE 0.84 0.87 0.80 1.02* 0.86   Estimated Creatinine Clearance: 26 mL/min (by C-G formula based on Cr of 0.86).    Allergies  Allergen Reactions  . Codeine Sulfate Other (See Comments)    unknown  . Ivp Dye [Iodinated Diagnostic Agents] Other (See Comments)    unknown  . Lansoprazole Other (See Comments)    unknown  . Losartan Potassium-Hctz Other (See Comments)    unknown  . Penicillins Other (See Comments)    unknown  . Pravachol [Pravastatin] Other (See Comments)    unknown  . Prednisone Other (See Comments)    unknown  . Sulfa Antibiotics Other (See Comments)    unknown  . Sulfonamide Derivatives Other (See Comments)    unknown    Antimicrobials this admission: Aztreonam 1/9 x1 Levaquin 1/9 x1 Vancomycin 1/9 x1 CTX 1/9 >>1/11 Ertapenem  1/11 >>   Vancomycin 1/14 >>    Levels/dose changes this admission: Vancomycin change from 500mg  IV q24h to 750mg  Q36h on 1/15 d/t Scr increase.  Microbiology results: 1/9 BCx: negative x 2  1/9 UCx: E.coli ESBL  1/15 UA: 2+LE,TNTC WBC,  rare bacteria   Sputum: none 1/14 MRSA PCR: neg 1/15 Cdiff neg  Thank you for allowing pharmacy to be a part of this patient's care.  Luisa HartChristy, Boone Gear D PharmD 01/24/2015 11:18 AM

## 2015-01-24 NOTE — Progress Notes (Signed)
Pharmacy Consult for Electrolyte Monitoring  Allergies  Allergen Reactions  . Codeine Sulfate Other (See Comments)    unknown  . Ivp Dye [Iodinated Diagnostic Agents] Other (See Comments)    unknown  . Lansoprazole Other (See Comments)    unknown  . Losartan Potassium-Hctz Other (See Comments)    unknown  . Penicillins Other (See Comments)    unknown  . Pravachol [Pravastatin] Other (See Comments)    unknown  . Prednisone Other (See Comments)    unknown  . Sulfa Antibiotics Other (See Comments)    unknown  . Sulfonamide Derivatives Other (See Comments)    unknown    Patient Measurements: Height: 4\' 8"  (142.2 cm) Weight: 97 lb 3.6 oz (44.1 kg) IBW/kg (Calculated) : 36.3  Vital Signs: Temp: 100.6 F (38.1 C) (01/16 2000) Temp Source: Rectal (01/16 1900) BP: 120/65 mmHg (01/16 2000) Pulse Rate: 88 (01/16 2000) Intake/Output from previous day: 01/15 0701 - 01/16 0700 In: 2748.2 [I.V.:1822.9; NG/GT:875.3; IV Piggyback:50] Out: 370 [Urine:370] Intake/Output from this shift:    Labs:  Recent Labs  01/23/15 0523 01/23/15 0950 01/24/15 0521  WBC 15.4* 14.5* 14.4*  HGB 9.9* 9.3* 8.5*  HCT 31.8* 30.0* 26.4*  PLT 383 334 296     Recent Labs  01/22/15 0503 01/22/15 1550 01/23/15 0523 01/24/15 0521 01/24/15 2033  NA 137  --  140 142  --   K 4.1  --  3.4* 3.7 4.8  CL 109  --  115* 119*  --   CO2 21*  --  15* 17*  --   GLUCOSE 156*  --  188* 200*  --   BUN 15  --  19 19  --   CREATININE 0.80  --  1.02* 0.86  --   CALCIUM 8.7*  --  8.2* 8.0*  --   MG  --   --   --  1.6*  --   PHOS  --   --   --  1.0* 4.2  PROT  --   --   --  5.1*  --   ALBUMIN  --   --   --  2.0*  --   AST  --   --   --  51*  --   ALT  --   --   --  50  --   ALKPHOS  --   --   --  100  --   BILITOT  --   --   --  0.9  --   TRIG  --  100  --   --   --    Estimated Creatinine Clearance: 26 mL/min (by C-G formula based on Cr of 0.86).    Recent Labs  01/24/15 1143 01/24/15 1648  01/24/15 1956  GLUCAP 169* 168* 156*    Medical History: Past Medical History  Diagnosis Date  . Edema leg   . Constipation   . LBBB (left bundle branch block)   . Allergic rhinitis   . Anxiety   . Alzheimer's dementia     Medications:  Scheduled:  . antiseptic oral rinse  7 mL Mouth Rinse QID  . chlorhexidine gluconate  15 mL Mouth Rinse BID  . enoxaparin (LOVENOX) injection  30 mg Subcutaneous Q24H  . ertapenem  0.5 g Intravenous Q24H  . free water  200 mL Per Tube 3 times per day  . insulin aspart  0-9 Units Subcutaneous 6 times per day  . megestrol  400 mg Oral BID  .  pantoprazole (PROTONIX) IV  40 mg Intravenous Q24H  . sodium chloride  3 mL Intravenous Q12H  . Vancomycin  750 mg Intravenous Q36H   Infusions:  . 0.9 % NaCl with KCl 20 mEq / L 75 mL/hr at 01/24/15 1435  . feeding supplement (VITAL AF 1.2 CAL) 1,000 mL (01/24/15 1435)  . norepinephrine (LEVOPHED) Adult infusion Stopped (01/23/15 1315)  . propofol (DIPRIVAN) infusion Stopped (01/22/15 1627)    Assessment: Pharmacy consulted to assist in managing electrolytes in this 82 y/o F admitted with UTI and respiratory failure due to code blue, PEA.   Plan:  Phos and mg are low so will replace with magnesium sulfate 2 g iv once and potassium phosphate 45 mmol iv once. Will recheck phos at 1800 to determine need for further replacement.   1/16 @ 20:30 :  K = 4.8                         Phos = 4.2   Will not order additional supplementation at this time.  Will recheck electrolytes on 1/17 with AM labs.   Isahia Hollerbach D 01/24/2015,9:37 PM

## 2015-01-24 NOTE — Progress Notes (Signed)
Sheree Fasching notified that patient was transferred from room 12 to room for in the ICU.  She was also updated on patients status and how she did throughout the night.

## 2015-01-24 NOTE — Progress Notes (Signed)
Transported pt to ICU 4 on the vent without incident. HR 94, SPO2 100%, RR 14, ETCO2 22. Pt remains on same settings.

## 2015-01-24 NOTE — Progress Notes (Signed)
[facility] CLINIC INFECTIOUS DISEASE PROGRESS NOTE Date of Admission:  02-12-15     ID: Christine Vance is a 82 y.o. female with a ESBL E coli  Active Problems:   UTI (lower urinary tract infection)   Protein-calorie malnutrition, severe   Subjective: Coded and in unit now. Fevers to 100.9, WBC 14.    ROS  Eleven systems are reviewed and negative except per hpi  Medications:  Antibiotics Given (last 72 hours)    Date/Time Action Medication Dose Rate   01/22/15 1011 Given   ertapenem (INVANZ) 0.5 g in sodium chloride 0.9 % 50 mL IVPB 0.5 g 100 mL/hr   01/22/15 1600 Given  [500 mg reconstituted with 100 mL of NS, but bag barcode would not scan and scanning vancomycin container produced error]   vancomycin (VANCOCIN) 500 mg in sodium chloride 0.9 % 100 mL IVPB 500 mg 100 mL/hr   01/22/15 2233 Given   vancomycin (VANCOCIN) 500 mg in sodium chloride 0.9 % 100 mL IVPB 500 mg 100 mL/hr   01/23/15 0956 Given   ertapenem (INVANZ) 0.5 g in sodium chloride 0.9 % 50 mL IVPB 0.5 g 100 mL/hr   01/24/15 1040 Given   ertapenem (INVANZ) 0.5 g in sodium chloride 0.9 % 50 mL IVPB 0.5 g 100 mL/hr   01/24/15 1040 Given   vancomycin (VANCOCIN) IVPB 750 mg/150 ml premix 750 mg 150 mL/hr     . antiseptic oral rinse  7 mL Mouth Rinse QID  . chlorhexidine gluconate  15 mL Mouth Rinse BID  . enoxaparin (LOVENOX) injection  30 mg Subcutaneous Q24H  . ertapenem  0.5 g Intravenous Q24H  . free water  200 mL Per Tube 3 times per day  . insulin aspart  0-9 Units Subcutaneous 6 times per day  . megestrol  400 mg Oral BID  . pantoprazole (PROTONIX) IV  40 mg Intravenous Q24H  . potassium phosphate IVPB (mmol)  45 mmol Intravenous Once  . sodium chloride  3 mL Intravenous Q12H  . Vancomycin  750 mg Intravenous Q36H    Objective: Vital signs in last 24 hours: Temp:  [99.5 F (37.5 C)-100.2 F (37.9 C)] 99.5 F (37.5 C) (01/16 1400) Pulse Rate:  [43-102] 77 (01/16 1400) Resp:  [16-35] 35 (01/16  1400) BP: (100-133)/(46-69) 131/62 mmHg (01/16 1400) SpO2:  [99 %-100 %] 100 % (01/16 1400) FiO2 (%):  [35 %] 35 % (01/16 1135) Weight:  [44.1 kg (97 lb 3.6 oz)] 44.1 kg (97 lb 3.6 oz) (01/16 0500) Constitutional: Thin, chronically ill appearing, intubated, curled on side. HENT: /AT, , no scleral icterus Mouth/Throat: Oropharynx ETT in place  Cardiovascular: Normal rate, regular rhythm and normal heart sounds. Pulmonary/Chest: Effort normal Rhonchi Neck = supple, no nuchal rigidity Abdominal: Soft. Bowel sounds are normal. exhibits no distension. There is no tenderness.  Lymphadenopathy: no cervical adenopathy. No axillary adenopathy Neurological:lethargic, sedated, uintubated Skin: Skin is warm and dry. No rash noted. No erythema.  Psychiatric sedated GU foley in place  Lab Results  Recent Labs  01/23/15 0523 01/23/15 0950 01/24/15 0521  WBC 15.4* 14.5* 14.4*  HGB 9.9* 9.3* 8.5*  HCT 31.8* 30.0* 26.4*  NA 140  --  142  K 3.4*  --  3.7  CL 115*  --  119*  CO2 15*  --  17*  BUN 19  --  19  CREATININE 1.02*  --  0.86    Microbiology: Results for orders placed or performed during the hospital encounter of  01/31/2015  Blood Culture (routine x 2)     Status: None   Collection Time: 01/26/2015  9:20 AM  Result Value Ref Range Status   Specimen Description BLOOD RIGHT ARM  Final   Special Requests   Final    BOTTLES DRAWN AEROBIC AND ANAEROBIC AEROBIC 2ML ANAEROBIC 1ML   Culture NO GROWTH 5 DAYS  Final   Report Status 01/22/2015 FINAL  Final  Blood Culture (routine x 2)     Status: None   Collection Time: 01/26/2015  9:31 AM  Result Value Ref Range Status   Specimen Description BLOOD LEFT SIDE  Final   Special Requests BOTTLES DRAWN AEROBIC AND ANAEROBIC 1ML  Final   Culture NO GROWTH 5 DAYS  Final   Report Status 01/22/2015 FINAL  Final  Urine culture     Status: None   Collection Time: 01/28/2015  9:31 AM  Result Value Ref Range Status   Specimen Description URINE,  RANDOM  Final   Special Requests NONE  Final   Culture   Final    >=100,000 COLONIES/mL ESCHERICHIA COLI Results [facility] AT 0900 ON 01/19/15 CTJ ESBL-EXTENDED SPECTRUM BETA LACTAMASE-THE ORGANISM IS RESISTANT TO PENICILLINS, CEPHALOSPORINS AND AZTREONAM ACCORDING TO CLSI M100-S15 VOL.25 N01 JAN 2005.    Report Status 01/19/2015 FINAL  Final   Organism ID, Bacteria ESCHERICHIA COLI  Final      Susceptibility   Escherichia coli - MIC*    AMPICILLIN >=32 RESISTANT Resistant     CEFTAZIDIME 4 RESISTANT Resistant     CEFAZOLIN >=64 RESISTANT Resistant     CEFTRIAXONE 32 RESISTANT Resistant     CIPROFLOXACIN >=4 RESISTANT Resistant     GENTAMICIN <=1 SENSITIVE Sensitive     IMIPENEM <=0.25 SENSITIVE Sensitive     TRIMETH/SULFA >=320 RESISTANT Resistant     Extended ESBL POSITIVE Resistant     NITROFURANTOIN Value in next row Sensitive      SENSITIVE<=16    PIP/TAZO Value in next row Sensitive      SENSITIVE<=4    AMPICILLIN/SULBACTAM Value in next row Sensitive      SENSITIVE4    * >=100,000 COLONIES/mL ESCHERICHIA COLI  MRSA PCR Screening     Status: None   Collection Time: 01/22/15  2:35 PM  Result Value Ref Range Status   MRSA by PCR NEGATIVE NEGATIVE Final    Comment:        The GeneXpert MRSA Assay (FDA approved for NASAL specimens only), is one component of a comprehensive MRSA colonization surveillance program. It is not intended to diagnose MRSA infection nor to guide or monitor treatment for MRSA infections.   C difficile quick scan w PCR reflex     Status: None   Collection Time: 01/23/15  3:59 AM  Result Value Ref Range Status   C Diff antigen NEGATIVE NEGATIVE Final   C Diff toxin NEGATIVE NEGATIVE Final   C Diff interpretation Negative for C. difficile  Final  Culture, blood (Routine X 2) w Reflex to ID Panel     Status: None (Preliminary result)   Collection Time: 01/23/15  9:50 AM  Result Value Ref Range Status   Specimen Description BLOOD  RIGHT WRIST  Final   Special Requests BOTTLES DRAWN AEROBIC AND ANAEROBIC  1CC  Final   Culture NO GROWTH < 12 HOURS  Final   Report Status PENDING  Incomplete    Studies/Results: Dg Chest 1 View  01/22/2015  CLINICAL DATA:  Acute respiratory failure, former  smoker. EXAM: CHEST 1 VIEW COMPARISON:  CT 02/07/2015 FINDINGS: Endotracheal tube 2.5 cm from carina. Elevation of the RIGHT hemidiaphragm is new. Associated RIGHT lower lobe atelectasis. No pulmonary edema. No pneumothorax. NG tube extends the stomach. Rounded density over the LEFT lung base likely represents nipple shadow. IMPRESSION: 1. Endotracheal tube in good position. 2. New elevation of the RIGHT hemidiaphragm with associated atelectasis. Electronically Signed   By: Genevive Bi M.D.   On: 01/22/2015 15:23   Dg Abd 1 View  01/22/2015  CLINICAL DATA:  OG tube placement. EXAM: ABDOMEN - 1 VIEW COMPARISON:  None. FINDINGS: Orogastric tube extends through the GE junction into the proximal stomach. Mild gaseous distention of the bowel is noted. There is moderate increased stool distending the rectum. No overt obstruction. IMPRESSION: 1. Orogastric tube passes below the diaphragm into the proximal stomach. 2. Mild gaseous distention of the bowel without convincing obstruction. Moderate increased stool in the rectum. Electronically Signed   By: Amie Portland M.D.   On: 01/22/2015 15:24   Dg Chest Port 1 View  01/24/2015  CLINICAL DATA: Respiratory failure. EXAM: PORTABLE CHEST 1 VIEW COMPARISON:  01/23/2015. FINDINGS: Endotracheal tube, NG tube, left PICC line in good anatomic position. Mediastinum hilar structures normal. Mild right base subsegmental atelectasis and or infiltrate. No pleural effusion or pneumothorax. IMPRESSION: 1. Lines and tubes stable position. 2. Mild right base subsegmental atelectasis. Electronically Signed   By: Maisie Fus  Register   On: 01/24/2015 07:43   Dg Chest Port 1 View  01/23/2015  CLINICAL DATA:  Readjustment  of PICC line EXAM: PORTABLE CHEST 1 VIEW COMPARISON:  Portable exam 1636 hours compared to 1246 hours FINDINGS: Tip of endotracheal tube projects 2.5 cm above carina. LEFT arm PICC line tip projects over SVC above cavoatrial junction. Normal heart size, mediastinal contours and pulmonary vascularity. Lungs clear. No pleural effusion or pneumothorax. Bones demineralized. IMPRESSION: Tip over LEFT arm PICC line projects over SVC. No acute infiltrate. Electronically Signed   By: Ulyses Southward M.D.   On: 01/23/2015 17:02   Dg Chest Port 1 View  01/23/2015  CLINICAL DATA:  Central line placement. EXAM: PORTABLE CHEST 1 VIEW COMPARISON:  01/23/2015 at 9:51 a.m. FINDINGS: Endotracheal tube has been pulled back with tip 3.1 cm above the carina. Nasogastric tube courses through the region of the stomach and off the inferior portion of the film as tip is not visualized. There has been placement of a left-sided PICC line with tip over the right atrium just below the cavoatrial junction. This could be pulled back approximately 3-4 cm. Patient is moderately rotated to the right. Lungs are adequately inflated without consolidation or effusion. No pneumothorax. Cardiomediastinal silhouette and remainder of the exam is unchanged. IMPRESSION: No acute cardiopulmonary disease. Tubes and lines as described. Note that the endotracheal tube has been adjusted and has tip 3.1 cm above the carina. Right-sided PICC line has tip over the right atrium just below the cavoatrial junction. This could be pulled back 3-4 cm. These results were called by telephone at the time of interpretation on 01/23/2015 at 1:55 pm to patient's nurse, Adron Bene, who verbally acknowledged these results. Electronically Signed   By: Elberta Fortis M.D.   On: 01/23/2015 13:55   Dg Chest Port 1 View  01/23/2015  CLINICAL DATA:  Acute respiratory failure, on ventilator, suspected aspiration, Alzheimer's, former smoker EXAM: PORTABLE CHEST 1 VIEW COMPARISON:   Portable exam 0952 hours compared to 01/22/2015 FINDINGS: Tip of endotracheal tube is within  1 cm of carina, directed toward RIGHT mainstem bronchus, recommend withdrawal 2 cm. Nasogastric tube coiled in stomach. Normal heart size, mediastinal contours and pulmonary vascularity. Atherosclerotic calcification aorta. Lungs clear. No pleural effusion or pneumothorax. IMPRESSION: Lungs clear. Improved aeration RIGHT base since previous exam. Tip of endotracheal tube is within 1 cm carina directed towards RIGHT mainstem bronchus; recommend withdrawal 2 cm. Findings called to [facility] RN in ICU on 01/23/2015 at 1202 hours. Electronically Signed   By: Ulyses Southward M.D.   On: 01/23/2015 12:03    Assessment/Plan: Christine Vance is a 82 y.o. female dementia, admitted with UTI with ESBL E coli. She is currently not taking pos. WBC had improved from 14->10 and had defervesced but then code blue 1/14 and intubated, now in unit, on vent. Did have fevers and leukocytosis recur. Initial cxr seemed to show elevation of R hemidiaphragm vs infiltrate -  I suspect aspiration PNA  Recommendations Continue vanco and ertapenem.  Check trach culture Poor prognosis and agree with Palliative care consult Thank you very much for the consult. Will follow with you.  FITZGERALD, DAVID   01/24/2015, 2:43 PM

## 2015-01-25 ENCOUNTER — Inpatient Hospital Stay: Payer: Medicare Other

## 2015-01-25 DIAGNOSIS — R0600 Dyspnea, unspecified: Secondary | ICD-10-CM | POA: Insufficient documentation

## 2015-01-25 DIAGNOSIS — Z9911 Dependence on respirator [ventilator] status: Secondary | ICD-10-CM

## 2015-01-25 DIAGNOSIS — R400 Somnolence: Secondary | ICD-10-CM

## 2015-01-25 DIAGNOSIS — E871 Hypo-osmolality and hyponatremia: Secondary | ICD-10-CM

## 2015-01-25 DIAGNOSIS — J690 Pneumonitis due to inhalation of food and vomit: Secondary | ICD-10-CM

## 2015-01-25 LAB — BLOOD GAS, ARTERIAL
ACID-BASE DEFICIT: 6 mmol/L — AB (ref 0.0–2.0)
Acid-base deficit: 6.8 mmol/L — ABNORMAL HIGH (ref 0.0–2.0)
Allens test (pass/fail): POSITIVE — AB
Allens test (pass/fail): POSITIVE — AB
BICARBONATE: 16.3 meq/L — AB (ref 21.0–28.0)
BICARBONATE: 16.2 meq/L — AB (ref 21.0–28.0)
FIO2: 0.35
FIO2: 0.35
Mechanical Rate: 14
O2 Saturation: 99.2 %
O2 Saturation: 98.9 %
PATIENT TEMPERATURE: 37
PEEP/CPAP: 5 cmH2O
PEEP/CPAP: 5 cmH2O
PH ART: 7.44 (ref 7.350–7.450)
PRESSURE SUPPORT: 5 cmH2O
Patient temperature: 37
VT: 450 mL
pCO2 arterial: 24 mmHg — ABNORMAL LOW (ref 32.0–48.0)
pCO2 arterial: 25 mmHg — ABNORMAL LOW (ref 32.0–48.0)
pH, Arterial: 7.42 (ref 7.350–7.450)
pO2, Arterial: 137 mmHg — ABNORMAL HIGH (ref 83.0–108.0)
pO2, Arterial: 124 mmHg — ABNORMAL HIGH (ref 83.0–108.0)

## 2015-01-25 LAB — BASIC METABOLIC PANEL
ANION GAP: 4 — AB (ref 5–15)
BUN: 18 mg/dL (ref 6–20)
CALCIUM: 7 mg/dL — AB (ref 8.9–10.3)
CO2: 15 mmol/L — ABNORMAL LOW (ref 22–32)
Chloride: 117 mmol/L — ABNORMAL HIGH (ref 101–111)
Creatinine, Ser: 0.78 mg/dL (ref 0.44–1.00)
Glucose, Bld: 154 mg/dL — ABNORMAL HIGH (ref 65–99)
POTASSIUM: 5.7 mmol/L — AB (ref 3.5–5.1)
SODIUM: 136 mmol/L (ref 135–145)

## 2015-01-25 LAB — POTASSIUM: POTASSIUM: 4.6 mmol/L (ref 3.5–5.1)

## 2015-01-25 LAB — GLUCOSE, CAPILLARY
GLUCOSE-CAPILLARY: 159 mg/dL — AB (ref 65–99)
GLUCOSE-CAPILLARY: 137 mg/dL — AB (ref 65–99)
GLUCOSE-CAPILLARY: 145 mg/dL — AB (ref 65–99)
Glucose-Capillary: 142 mg/dL — ABNORMAL HIGH (ref 65–99)
Glucose-Capillary: 155 mg/dL — ABNORMAL HIGH (ref 65–99)

## 2015-01-25 LAB — EXPECTORATED SPUTUM ASSESSMENT W REFEX TO RESP CULTURE: SPECIAL REQUESTS: NORMAL

## 2015-01-25 LAB — TRIGLYCERIDES: TRIGLYCERIDES: 77 mg/dL (ref <150)

## 2015-01-25 LAB — MAGNESIUM: MAGNESIUM: 1.8 mg/dL (ref 1.7–2.4)

## 2015-01-25 LAB — PHOSPHORUS
PHOSPHORUS: 2.8 mg/dL (ref 2.5–4.6)
Phosphorus: 1 mg/dL — CL (ref 2.5–4.6)

## 2015-01-25 MED ORDER — SODIUM CHLORIDE 0.9 % IV SOLN
INTRAVENOUS | Status: DC
  Administered 2015-01-25 – 2015-01-29 (×6): via INTRAVENOUS

## 2015-01-25 MED FILL — Medication: Qty: 1 | Status: AC

## 2015-01-25 NOTE — Progress Notes (Addendum)
[facility] Critical Care Medicine Progess Note    ASSESSMENT/PLAN    82 year female past medical history of dementia, recurrent UTI, currently with ESBL UTI, status post CODE BLUE.  PULMONARY OETT Acute respiratory failure due to cardiac arrest, complicated by pneumonia and UTI with sepsis.  ESBL in urine; sputum cultures still pending.  PEA arrest times 8-10 minutes Mechanical ventilation Hypoxia Possible right hemi-diaphragm elevation Sepsis P:  -Secondary to PEA arrest, now becoming a bit more responsive, patient failed weaning yesterday, we will attempt again today. -Continue mechanical ventilation-maintain O2 sats greater than 88%; possible vent weaning today.  -ABG as needed -Continue with current antibiotics and hemodynamic support  CARDIOVASCULAR CODE BLUE-PEA arrest Hypertension P:  -Possibly secondary to sepsis, ESBL UTI -Continue with current vasopressors, wean as tolerated -Continue with hemodynamic monitoring  RENAL -Electrolytes -ICU electrolyte replacement protocol  GASTROINTESTINAL -OGT -Continue tube feeds.  -GI prophylaxis  HEMATOLOGIC -Monitor CBC line-leukocytosis secondary to stress leukemoid reaction along with sepsis  INFECTIOUS ESBL UTI P:  -Continue current antibiotics -Infectious disease currently following along -Patient will require at least 7 days of IV antibiotics, will place consult for PICC line  ENDOCRINE -ICU hypo/hyperglycemia protocol  NEUROLOGIC A: Encephalopathy-metabolic, worsening dementia; multifactorial P:  RASS goal: 0 -Multifactorial reasons for encephalopathy including dementia, sepsis, recent PEA arrests. -Review of chart shows that patient has been slowly declining and may of had continued progression of her dementia, her healthcare power of attorney has verified understanding of patient's clinical status, and we'll continue with full CODE STATUS. -Currently patient is withdrawing to pain and  tracking extremities decide of touch and pain, she is not tracking with her eyes today. Patient is currently not requiring any type of sedation or agitation medications.  SOCIAL - FULL CODE - Palliative Care signed off, see note on 1/13.      ---------------------------------------   ----------------------------------------   Name: Christine Vance MRN: [id number] DOB: 22-Jun-1920    ADMISSION DATE:  02/07/2015    CHIEF COMPLAINT:  Dyspnea      SUBJECTIVE:   Pt currently on the ventilator, can not provide history or review of systems.     VITAL SIGNS: Temp:  [99.3 F (37.4 C)-100.9 F (38.3 C)] 100.2 F (37.9 C) (01/17 0800) Pulse Rate:  [34-92] 82 (01/17 0800) Resp:  [15-35] 17 (01/17 0800) BP: (112-137)/(49-65) 133/55 mmHg (01/17 0800) SpO2:  [92 %-100 %] 100 % (01/17 0800) FiO2 (%):  [35 %] 35 % (01/17 0838) Weight:  [105 lb 2.6 oz (47.7 kg)] 105 lb 2.6 oz (47.7 kg) (01/17 0500) HEMODYNAMICS:   VENTILATOR SETTINGS: Vent Mode:  [-] PSV FiO2 (%):  [35 %] 35 % Set Rate:  [14 bmp] 14 bmp Vt Set:  [450 mL] 450 mL PEEP:  [5 cmH20] 5 cmH20 Pressure Support:  [5 cmH20-10 cmH20] 5 cmH20 INTAKE / OUTPUT:  Intake/Output Summary (Last 24 hours) at 01/25/15 0926 Last data filed at 01/25/15 0600  Gross per 24 hour  Intake   3960 ml  Output    751 ml  Net   3209 ml    PHYSICAL EXAMINATION: Physical Examination:   VS: BP 133/55 mmHg  Pulse 82  Temp(Src) 100.2 F (37.9 C) (Rectal)  Resp 17  Ht  (1.422 m)  Wt 105 lb 2.6 oz (47.7 kg)  BMI 23.59 kg/m2  SpO2 100%  General Appearance: No distress  Neuro:without focal findings, mental status normal. HEENT: PERRLA, Pulmonary: normal breath sounds   CardiovascularNormal S1,S2.  No m/r/g.  Abdomen: Benign, Soft, non-tender. Renal:  No costovertebral tenderness  GU:  Not performed at this time. Endocrine: No evident thyromegaly. Skin:   warm, no rashes, no ecchymosis  Extremities: normal, no cyanosis,  clubbing.   LABS:   LABORATORY PANEL:   CBC  Recent Labs Lab 01/24/15 0521  WBC 14.4*  HGB 8.5*  HCT 26.4*  PLT 296    Chemistries   Recent Labs Lab 01/24/15 0521  01/25/15 0709  NA 142  --  136  K 3.7  < > 5.7*  CL 119*  --  117*  CO2 17*  --  15*  GLUCOSE 200*  --  154*  BUN 19  --  18  CREATININE 0.86  --  0.78  CALCIUM 8.0*  --  7.0*  MG 1.6*  --  1.8  PHOS 1.0*  < > 2.8  AST 51*  --   --   ALT 50  --   --   ALKPHOS 100  --   --   BILITOT 0.9  --   --   < > = values in this interval not displayed.   Recent Labs Lab 01/24/15 1143 01/24/15 1648 01/24/15 1956 01/24/15 2328 01/25/15 0352 01/25/15 0715  GLUCAP 169* 168* 156* 144* 159* 137*    Recent Labs Lab 01/24/15 1140 01/25/15 0427 01/25/15 0915  PHART 7.45 7.44 7.42  PCO2ART 23* 24* 25*  PO2ART 118* 137* 124*    Recent Labs Lab 01/24/15 0521  AST 51*  ALT 50  ALKPHOS 100  BILITOT 0.9  ALBUMIN 2.0*    Cardiac Enzymes No results for input(s): TROPONINI in the last 168 hours.  RADIOLOGY:  Dg Chest 1 View  01/25/2015  CLINICAL DATA:  Shortness of breath. EXAM: CHEST 1 VIEW COMPARISON:  01/24/2015. FINDINGS: Endotracheal tube and left PICC line stable position. Mediastinum and hilar structures are stable. Progressive bibasilar subsegmental atelectasis. No pleural effusion or pneumothorax. IMPRESSION: 1. Lines and tubes in stable position. 2. Progressive bibasilar subsegmental atelectasis. Electronically Signed   By: Maisie Fus  Register   On: 01/25/2015 07:56   Dg Chest Port 1 View  01/24/2015  CLINICAL DATA: Respiratory failure. EXAM: PORTABLE CHEST 1 VIEW COMPARISON:  01/23/2015. FINDINGS: Endotracheal tube, NG tube, left PICC line in good anatomic position. Mediastinum hilar structures normal. Mild right base subsegmental atelectasis and or infiltrate. No pleural effusion or pneumothorax. IMPRESSION: 1. Lines and tubes stable position. 2. Mild right base subsegmental atelectasis.  Electronically Signed   By: Maisie Fus  Register   On: 01/24/2015 07:43   Dg Chest Port 1 View  01/23/2015  CLINICAL DATA:  Readjustment of PICC line EXAM: PORTABLE CHEST 1 VIEW COMPARISON:  Portable exam 1636 hours compared to 1246 hours FINDINGS: Tip of endotracheal tube projects 2.5 cm above carina. LEFT arm PICC line tip projects over SVC above cavoatrial junction. Normal heart size, mediastinal contours and pulmonary vascularity. Lungs clear. No pleural effusion or pneumothorax. Bones demineralized. IMPRESSION: Tip over LEFT arm PICC line projects over SVC. No acute infiltrate. Electronically Signed   By: Ulyses Southward M.D.   On: 01/23/2015 17:02   Dg Chest Port 1 View  01/23/2015  CLINICAL DATA:  Central line placement. EXAM: PORTABLE CHEST 1 VIEW COMPARISON:  01/23/2015 at 9:51 a.m. FINDINGS: Endotracheal tube has been pulled back with tip 3.1 cm above the carina. Nasogastric tube courses through the region of the stomach and off the inferior portion of the film as tip is not visualized. There has been placement of  a left-sided PICC line with tip over the right atrium just below the cavoatrial junction. This could be pulled back approximately 3-4 cm. Patient is moderately rotated to the right. Lungs are adequately inflated without consolidation or effusion. No pneumothorax. Cardiomediastinal silhouette and remainder of the exam is unchanged. IMPRESSION: No acute cardiopulmonary disease. Tubes and lines as described. Note that the endotracheal tube has been adjusted and has tip 3.1 cm above the carina. Right-sided PICC line has tip over the right atrium just below the cavoatrial junction. This could be pulled back 3-4 cm. These results were called by telephone at the time of interpretation on 01/23/2015 at 1:55 pm to patient's nurse, Adron Bene, who verbally acknowledged these results. Electronically Signed   By: Elberta Fortis M.D.   On: 01/23/2015 13:55   Dg Chest Port 1 View  01/23/2015  CLINICAL DATA:   Acute respiratory failure, on ventilator, suspected aspiration, Alzheimer's, former smoker EXAM: PORTABLE CHEST 1 VIEW COMPARISON:  Portable exam 0952 hours compared to 01/22/2015 FINDINGS: Tip of endotracheal tube is within 1 cm of carina, directed toward RIGHT mainstem bronchus, recommend withdrawal 2 cm. Nasogastric tube coiled in stomach. Normal heart size, mediastinal contours and pulmonary vascularity. Atherosclerotic calcification aorta. Lungs clear. No pleural effusion or pneumothorax. IMPRESSION: Lungs clear. Improved aeration RIGHT base since previous exam. Tip of endotracheal tube is within 1 cm carina directed towards RIGHT mainstem bronchus; recommend withdrawal 2 cm. Findings called to [facility] RN in ICU on 01/23/2015 at 1202 hours. Electronically Signed   By: Ulyses Southward M.D.   On: 01/23/2015 12:03       --Wells Guiles, MD.  Pager [phone]  Pulmonary and Critical Care   Santiago Glad, M.D.  Stephanie Acre, M.D.  Billy Fischer, M.D  Critical Care Attestation.  I have personally obtained a history, examined the patient, evaluated laboratory and imaging results, formulated the assessment and plan and placed orders. The Patient requires high complexity decision making for assessment and support, frequent evaluation and titration of therapies, application of advanced monitoring technologies and extensive interpretation of multiple databases. The patient has critical illness that could lead imminently to failure of 1 or more organ systems and requires the highest level of physician preparedness to intervene.  Critical Care Time devoted to patient care services described in this note is 35 minutes and is exclusive of time spent in procedures.

## 2015-01-25 NOTE — Progress Notes (Signed)
At bedside to extubate patient. Patient with increased WOB, has been on pressure support for 3 hours. Paged Dr. Ardyth Man with no response at this time, will not extubate until further discussion with MD.

## 2015-01-25 NOTE — Progress Notes (Signed)
Patient remains on ventilator, setting unchanged, not requiring any sedation.  Patient open eyes spontaneously but still does not follow any command. VSS, sinus rhythm on cardiac monitor.  NS with infusing at 70ml/hr without difficulty. Tube feeding continues with 24ml/hr and 2105ml/hr flush q8hr with no signs of intolerance.  Foley in place and patent output adequate urine.  Patient currently resting in no apparent distress.  See CH[facility] for further details.

## 2015-01-25 NOTE — Progress Notes (Signed)
Patient placed back on full support, previous settings on PRVC due to increase work of breathing.  Will not extubate at this time.  Patient's RN and physician made aware.

## 2015-01-25 NOTE — Progress Notes (Signed)
Palliative Medicine Inpatient Consult Follow Up Note   Name: Christine Vance Date: 01/25/2015 MRN: [id number]  DOB: 06-20-1920  Referring Physician: Milagros Loll, MD  Palliative Care consult requested for this 82 y.o. female for goals of medical therapy in patient who was seen by me on Jan 13th, prior to her experiencing a CODE BLUE on Jan 14th.  She could not be extubated this am due to increased work of breathing.  She was possibly w/o oxygen during CPR/ ACLS for 8-10 minutes per estimates in notes.  Pt's daughter-in-law had told me on Friday, "I am never going to change my mind.  I want everything done.  I want her off of those thickened liquids because they cause her to be dehydrated.  I want a feeding tube when it is needed but I don't think she needs one yet.  I want her to remain FULL CODE and I am not going to change my mind."  Review of notes showed that she has, in the past, opted for full code and aggressive care, so this is consistent with her long term approach to her mother-in-law's care.    I had actually signed off on Friday 1/13, but was asked to try once more to talk with the daughter, given that things had changed radically with the CODE BLUE and intubated status.    TODAY'S DISCUSSIONS AND DECISIONS: I noted that there is an apparently valid POA form in the paper chart. This is not quite the same as a HCPOA form and if one of those existed, it would trump the POA form by law.  But, there is no evidence of a HCPOA form in existence and there is a small 'health care payment' clause in the POA form. This often means that if there are no others who step up to make decisions for a patient, that we honor the POA's decisions regarding health care.  There have not been others involved as far as I know.  I called and spoke with Sheree, POA.  She says she has not changed her mind. She still wants everything done. She became tangential, saying things about pt having been 'septic for so long'  and having 'antibiotics changed so many times' and other statements that had nothing to do with the fact that pt may well have experienced some brain injury from the lack of circulation during the code.   I kept bringing her back around to that topic. But, she said she still wants aggressive care and that 'she will be back to normal' etc.    I will be glad to follow at a distance --meaning a daily chart review and touching base with attendings to see if there has been a change which might necessitate me getting involved again.  However, I do not feel it is productive to have me call the POA daily at this point in time.   She may never give up on her insistence for aggressive care.  She has stated that she would want a feeding tube, etc.  This might change over time, but doubt anything will change her mind soon.    IMPRESSION: S/p cardiopulmonary arrest on 1/14 with CODE BLUE resulting in intubation ---currently not sedated and not responsive ---was without O2 possibly for 8 -10 minutes during Code Blue (per records ) Sepsis due to UTI UTI due to ESBL E coli --ABX per ID  Alzheimer's Dementia Dysphagia ---Has been seen by SLP and now has a diet ordered for dysphagia  1 (pureed with honey thick liquids) Poor oral intake ---she started a diet of dysphagia 1 with honey thick liquids today and only took 2 bites per report Anxiety and h/o depression LBBB Constipation Dependent edema Moderate Malnutrition present at admission Acute Renal Failure due to dehydration -resolved now Dehydration present at admission Former Smoker Multiple Drug Allergies or Sensitivities Fatigue and weakness Metabolic Encephalopathy Hyponatremia Anemia --unclear etiology (Hgb 8.8 01/21/15) Hyperglycemia Vit D deficiency H/O right hip fx and repair and gait disorder   REVIEW OF SYSTEMS:  Patient is not able to provide ROS --INTUBATED AND UNRESPONSIVE  CODE STATUS: Full code  -FULL CODE IS CONFIRMED AGAIN DURING  MY PHONE DISCUSSION WITH PT'S DAUGHTER-IN-LAW (WHO IS POA --NOT HCPOA BUT AS POA THERE IS AN IMPLIED HEALTH CARE AUTHORITY )   PAST MEDICAL HISTORY: Past Medical History  Diagnosis Date  . Edema leg   . Constipation   . LBBB (left bundle branch block)   . Allergic rhinitis   . Anxiety   . Alzheimer's dementia     PAST SURGICAL HISTORY:  Past Surgical History  Procedure Laterality Date  . Cholecystectomy  1960's  . Hemorrhoid surgery  late 1950's  . Other surgery  1960's    hysterectomy    Vital Signs: BP 102/45 mmHg  Pulse 82  Temp(Src) 99.3 F (37.4 C) (Rectal)  Resp 21  Ht  (1.422 m)  Wt 47.7 kg (105 lb 2.6 oz)  BMI 23.59 kg/m2  SpO2 100% Filed Weights   01/23/15 0321 01/24/15 0500 01/25/15 0500  Weight: 42 kg (92 lb 9.5 oz) 44.1 kg (97 lb 3.6 oz) 47.7 kg (105 lb 2.6 oz)    Estimated body mass index is 23.59 kg/(m^2) as calculated from the following:   Height as of this encounter:  (1.422 m).   Weight as of this encounter: 47.7 kg (105 lb 2.6 oz).  PHYSICAL EXAM: Lying with head to one side --passively propped up by pillows Intubated but not requiring sedation as she is unresponsive She was unresponsive to the pain of getting a blood gas per RT Eyelids are closed but when checked, pupils are deviated to the right No JVD or TM Hrt rrr  Lungs with vent sounds and some ronchi Abd soft and NT Ext no mottling or cyanosis   LABS: CBC:    Component Value Date/Time   WBC 14.4* 01/24/2015 0521   WBC 7.6 04/21/2014 1440   HGB 8.5* 01/24/2015 0521   HGB 8.8* 04/25/2014 0451   HCT 26.4* 01/24/2015 0521   HCT 35.3 04/21/2014 1440   PLT 296 01/24/2015 0521   PLT 350 04/24/2014 0344   MCV 84.0 01/24/2015 0521   MCV 91 04/21/2014 1440   NEUTROABS 11.5* 01/23/2015 0950   NEUTROABS 4.5 10/15/2013 0451   LYMPHSABS 1.9 01/23/2015 0950   LYMPHSABS 1.6 10/15/2013 0451   MONOABS 0.9 01/23/2015 0950   MONOABS 0.5 10/15/2013 0451   EOSABS 0.0 01/23/2015 0950    EOSABS 0.4 10/15/2013 0451   BASOSABS 0.1 01/23/2015 0950   BASOSABS 0.1 10/15/2013 0451   Comprehensive Metabolic Panel:    Component Value Date/Time   NA 136 01/25/2015 0709   NA 137 04/24/2014 0344   K 4.6 01/25/2015 0957   K 3.9 04/24/2014 0344   CL 117* 01/25/2015 0709   CL 108 04/24/2014 0344   CO2 15* 01/25/2015 0709   CO2 25 04/24/2014 0344   BUN 18 01/25/2015 0709   BUN 23* 04/24/2014 0344  CREATININE 0.78 01/25/2015 0709   CREATININE 1.08* 04/25/2014 0451   GLUCOSE 154* 01/25/2015 0709   GLUCOSE 141* 04/24/2014 0344   CALCIUM 7.0* 01/25/2015 0709   CALCIUM 8.2* 04/24/2014 0344   AST 51* 01/24/2015 0521   AST 20 04/21/2014 1440   ALT 50 01/24/2015 0521   ALT 12* 04/21/2014 1440   ALKPHOS 100 01/24/2015 0521   ALKPHOS 99 04/21/2014 1440   BILITOT 0.9 01/24/2015 0521   BILITOT 0.5 04/21/2014 1440   PROT 5.1* 01/24/2015 0521   PROT 6.8 04/21/2014 1440   ALBUMIN 2.0* 01/24/2015 0521   ALBUMIN 3.9 04/21/2014 1440     More than 50% of the visit was spent in counseling/coordination of care: YES  Time Spent:  25 min

## 2015-01-25 NOTE — Progress Notes (Signed)
Nutrition Follow-up  DOCUMENTATION CODES:   Severe malnutrition in context of chronic illness  INTERVENTION:   EN: recommend continuing current TF regimen as tolerated by pt    NUTRITION DIAGNOSIS:   Inadequate oral intake related to inability to eat as evidenced by NPO status.  GOAL:   Patient will meet greater than or equal to 90% of their needs  MONITOR:    (Energy Intake, Anthropometrics, Electrolyte and rneal Profile, Digestive System, Glucose Profile)  REASON FOR ASSESSMENT:   Consult Poor PO  ASSESSMENT:   Pt admitted with AMS and UTI. Pt with h/o dementia, dysphagia and was on honey thickened liquids PTA. Palliative Care consulted.  Pt remains on vent, failed weaning attempt this AM. Hyperkalemic this AM but receiving potassium supplementation, IVF changed; pt febrile, not responding to sternal rub, not on sedation  Diet Order:   NPO  EN: tolerating Vital 1.2 AF at 45 ml/hr  Digestive System: no signs of TF intolerance, +BM during the night per Pam RN  Last BM:  unknown   Electrolyte and Renal Profile:  Recent Labs Lab 01/23/15 0523 01/24/15 0521 01/24/15 2033 01/25/15 0709 01/25/15 0957  BUN 19 19  --  18  --   CREATININE 1.02* 0.86  --  0.78  --   NA 140 142  --  136  --   K 3.4* 3.7 4.8 5.7* 4.6  MG  --  1.6*  --  1.8  --   PHOS  --  1.0* 4.2 2.8  --    Glucose Profile:  Recent Labs  01/25/15 0352 01/25/15 0715 01/25/15 1148  GLUCAP 159* 137* 145*    Meds: megace, levophed, NS at 75 ml/hr with KCL, ss novolog  Height:   Ht Readings from Last 1 Encounters:  01/25/15  (1.422 m)    Weight:   Wt Readings from Last 1 Encounters:  01/25/15 105 lb 2.6 oz (47.7 kg)    BMI:  Body mass index is 23.59 kg/(m^2).  Estimated Nutritional Needs:   Kcal:  TEE 1340 kcals/d (Ve 16, Tmax 38.3)  Protein:  (1.2-2.0 g/kg) 50-84 g/d)  Fluid:  (25-11ml/kg) 1050-124ml/d  EDUCATION NEEDS:   No education needs identified at this time    HIGH Care Level  Romelle Starcher MS, RD, LDN [phone] Pager  [phone] Weekend/On-Call Pager

## 2015-01-25 NOTE — Progress Notes (Signed)
Pharmacy Antibiotic Follow-up Note  Christine Vance is a 82 y.o. year-old female admitted on 01/31/2015.  The patient is currently on day 7 of Ertapenem  Q24h for ESBL UTI and Day 4 of Vancomycin for sepsis.  Assessment/Plan: ID plans to continue vancomycin for now. Will continue Vancomycin   IV Q36h. Will not schedule a trough at this point but will continue to watch renal function closely and check levels as clinically indicated. Discussed duration of therapy for ertapenem with Dr. Nicholos Johns who wants a total of 10 days of therapy. Will continue Ertapenem 500 mg iv q 24 hours for 3 more days.    ID following.  Temp (24hrs), Avg:100.3 F (37.9 C), Min:99.3 F (37.4 C), Max:100.9 F (38.3 C)   Recent Labs Lab 01/21/15 0446 01/23/15 0523 01/23/15 0950 01/24/15 0521  WBC 7.0 15.4* 14.5* 14.4*     Recent Labs Lab 01/21/15 0446 01/22/15 0503 01/23/15 0523 01/24/15 0521 01/25/15 0709  CREATININE 0.87 0.80 1.02* 0.86 0.78   Estimated Creatinine Clearance: 29 mL/min (by C-G formula based on Cr of 0.78).    Allergies  Allergen Reactions  . Codeine Sulfate Other (See Comments)    unknown  . Ivp Dye [Iodinated Diagnostic Agents] Other (See Comments)    unknown  . Lansoprazole Other (See Comments)    unknown  . Losartan Potassium-Hctz Other (See Comments)    unknown  . Penicillins Other (See Comments)    unknown  . Pravachol [Pravastatin] Other (See Comments)    unknown  . Prednisone Other (See Comments)    unknown  . Sulfa Antibiotics Other (See Comments)    unknown  . Sulfonamide Derivatives Other (See Comments)    unknown    Antimicrobials this admission: Aztreonam 1/9 x1 Levaquin 1/9 x1 Vancomycin 1/9 x1 CTX 1/9 >>1/11 Ertapenem  1/11 >>   Vancomycin 1/14 >>    Levels/dose changes this admission:   Microbiology results: 1/9 BCx: negative x 2  1/9 UCx: E.coli ESBL  1/15 UA: 2+LE,TNTC WBC, rare bacteria   Sputum: none 1/14 MRSA PCR:  neg 1/15 Cdiff neg  Thank you for allowing pharmacy to be a part of this patient's care.  Luisa Hart D PharmD 01/25/2015 1:44 PM

## 2015-01-25 NOTE — Progress Notes (Signed)
[facility] [place] [facility]   PATIENT NAME: Christine Vance    MR#:  [id number]  DATE OF BIRTH:  1920/03/19  SUBJECTIVE:  CHIEF COMPLAINT:   Chief Complaint  Patient presents with  . Urinary Tract Infection    unresponsive   Sent from NH- found with UTI, malnourished, and dehydrated.  Patient coded ,PEA , arrest time 8-10 min yesterday and was moved to intensive care unit and got intubated 1/14. Currently not on any sedation.  No family members at bedside  REVIEW OF SYSTEMS:   Limited review of systems Review of Systems  Unable to perform ROS   DRUG ALLERGIES:   Allergies  Allergen Reactions  . Codeine Sulfate Other (See Comments)    unknown  . Ivp Dye [Iodinated Diagnostic Agents] Other (See Comments)    unknown  . Lansoprazole Other (See Comments)    unknown  . Losartan Potassium-Hctz Other (See Comments)    unknown  . Penicillins Other (See Comments)    unknown  . Pravachol [Pravastatin] Other (See Comments)    unknown  . Prednisone Other (See Comments)    unknown  . Sulfa Antibiotics Other (See Comments)    unknown  . Sulfonamide Derivatives Other (See Comments)    unknown    VITALS:  Blood pressure 102/45, pulse 82, temperature 99.3 F (37.4 C), temperature source Rectal, resp. rate 21, height  (1.422 m), weight 47.7 kg (105 lb 2.6 oz), SpO2 100 %.  PHYSICAL EXAMINATION:   GENERAL: 82 y.o.-year-old patient lying in the bed with no acute distress.  EYES: Pupils equal, round, reactive to light sluggishly. No scleral icterus.   HEENT: Head atraumatic, normocephalic. ET tube intact . Positive gag reflex per RN report NECK: Supple, no jugular venous distention. No thyroid enlargement, no tenderness.  LUNGS: Diminished  breath sounds bilaterally, no wheezing, rales, rhonchi. No use of accessory muscles of respiration.  CARDIOVASCULAR: S1, S2 normal. No murmurs, rubs, or gallops.  ABDOMEN: Soft, nontender,  nondistended. Bowel sounds present. No organomegaly or mass.  EXTREMITIES: No pedal edema, cyanosis, or clubbing. + 2 pedal & radial pulses b/l. Diffuse muscle wasting NEUROLOGIC: Intubated PSYCHIATRIC: The patient is intubated, not on  sedatives  Physical Exam LABORATORY PANEL:   CBC  Recent Labs Lab 01/24/15 0521  WBC 14.4*  HGB 8.5*  HCT 26.4*  PLT 296   ------------------------------------------------------------------------------------------------------------------  Chemistries   Recent Labs Lab 01/24/15 0521  01/25/15 0709 01/25/15 0957  NA 142  --  136  --   K 3.7  < > 5.7* 4.6  CL 119*  --  117*  --   CO2 17*  --  15*  --   GLUCOSE 200*  --  154*  --   BUN 19  --  18  --   CREATININE 0.86  --  0.78  --   CALCIUM 8.0*  --  7.0*  --   MG 1.6*  --  1.8  --   AST 51*  --   --   --   ALT 50  --   --   --   ALKPHOS 100  --   --   --   BILITOT 0.9  --   --   --   < > = values in this interval not displayed. ------------------------------------------------------------------------------------------------------------------  Cardiac Enzymes No results for input(s): TROPONINI in the last 168 hours. ------------------------------------------------------------------------------------------------------------------  RADIOLOGY:  Dg Chest 1 View  01/25/2015  CLINICAL DATA:  Shortness of  breath. EXAM: CHEST 1 VIEW COMPARISON:  01/24/2015. FINDINGS: Endotracheal tube and left PICC line stable position. Mediastinum and hilar structures are stable. Progressive bibasilar subsegmental atelectasis. No pleural effusion or pneumothorax. IMPRESSION: 1. Lines and tubes in stable position. 2. Progressive bibasilar subsegmental atelectasis. Electronically Signed   By: Maisie Fus  Register   On: 01/25/2015 07:56   Dg Chest Port 1 View  01/24/2015  CLINICAL DATA: Respiratory failure. EXAM: PORTABLE CHEST 1 VIEW COMPARISON:  01/23/2015. FINDINGS: Endotracheal tube, NG tube, left PICC line in  good anatomic position. Mediastinum hilar structures normal. Mild right base subsegmental atelectasis and or infiltrate. No pleural effusion or pneumothorax. IMPRESSION: 1. Lines and tubes stable position. 2. Mild right base subsegmental atelectasis. Electronically Signed   By: Maisie Fus  Register   On: 01/24/2015 07:43   Dg Chest Port 1 View  01/23/2015  CLINICAL DATA:  Readjustment of PICC line EXAM: PORTABLE CHEST 1 VIEW COMPARISON:  Portable exam 1636 hours compared to 1246 hours FINDINGS: Tip of endotracheal tube projects 2.5 cm above carina. LEFT arm PICC line tip projects over SVC above cavoatrial junction. Normal heart size, mediastinal contours and pulmonary vascularity. Lungs clear. No pleural effusion or pneumothorax. Bones demineralized. IMPRESSION: Tip over LEFT arm PICC line projects over SVC. No acute infiltrate. Electronically Signed   By: Ulyses Southward M.D.   On: 01/23/2015 17:02    ASSESSMENT AND PLAN:   Active Problems:   UTI (lower urinary tract infection)   Protein-calorie malnutrition, severe  *Acute hypoxic respiratory failure secondary to aspiration PNA On vancomycin and invanz for broad abx.  * Cardiac arrest -PEA Intubated Need to assess mental status if extubated FULL CODE  * Acute encephalopathy secondary to sepsis secondary to possible aspiration pneumonia, superimposed on acute cystitis from ESBL/failure to thrive with poor by mouth intake Improved on IV abx but now intubated. Need to reassess once extubated  * Hyponatremia with severe dehydration-resolved with IV fluids  * Severe malnutrition On TFs  * ARF secondary to dehydration from poor by mouth intake Resolved with IV fluids  poor prognosis  CODE STATUS: FUll  TOTAL CRITICAL CARE TIME TAKING CARE OF THIS PATIENT: 35 minutes.    Milagros Loll R M.D on 01/25/2015   Between 7am to 6pm - Pager - [phone]  After 6pm go to [email or web address] - password [email or web address] [facility]  [facility]   Office  [phone]  CC: Primary care physician; Keane Police, MD  Note: This dictation was prepared with Dragon dictation along with smaller phrase technology. Any transcriptional errors that result from this process are unintentional.

## 2015-01-25 NOTE — Clinical Social Work Note (Signed)
CSW noted that an attempt to extubate today was unsuccessful. Patient remains on vent at this time. Patient from [facility]. York Spaniel MSW,LCSW [phone]

## 2015-01-25 NOTE — Progress Notes (Signed)
Patient with no sedation this shift, no response except to painful stimuli. Pupils equal but with upward right gaze bilateral.Did not extubate today secondary to increased WOB. Tolerating tube feeds, 1L UOP this shift, small BM. Updated patient dgt-in-law on patient status and POC.

## 2015-01-25 NOTE — Progress Notes (Signed)
[facility] CLINIC INFECTIOUS DISEASE PROGRESS NOTE Date of Admission:  2015-02-08     ID: Christine Vance is a 82 y.o. female with a ESBL E coli  Active Problems:   UTI (lower urinary tract infection)   Protein-calorie malnutrition, severe   Dyspnea   Subjective: Remains intubated, still with low grade fevers.   ROS  Eleven systems are reviewed and negative except per hpi  Medications:  Antibiotics Given (last 72 hours)    Date/Time Action Medication Dose Rate   01/22/15 2233 Given   vancomycin (VANCOCIN) 500 mg in sodium chloride 0.9 % 100 mL IVPB 500 mg 100 mL/hr   01/23/15 0956 Given   ertapenem (INVANZ) 0.5 g in sodium chloride 0.9 % 50 mL IVPB 0.5 g 100 mL/hr   01/24/15 1040 Given   ertapenem (INVANZ) 0.5 g in sodium chloride 0.9 % 50 mL IVPB 0.5 g 100 mL/hr   01/24/15 1040 Given   vancomycin (VANCOCIN) IVPB 750 mg/150 ml premix 750 mg 150 mL/hr   01/25/15 1140 Given   ertapenem (INVANZ) 0.5 g in sodium chloride 0.9 % 50 mL IVPB 0.5 g 100 mL/hr     . antiseptic oral rinse  7 mL Mouth Rinse QID  . chlorhexidine gluconate  15 mL Mouth Rinse BID  . enoxaparin (LOVENOX) injection  30 mg Subcutaneous Q24H  . ertapenem  0.5 g Intravenous Q24H  . free water  200 mL Per Tube 3 times per day  . insulin aspart  0-9 Units Subcutaneous 6 times per day  . megestrol  400 mg Oral BID  . pantoprazole (PROTONIX) IV  40 mg Intravenous Q24H  . sodium chloride  3 mL Intravenous Q12H  . Vancomycin  750 mg Intravenous Q36H    Objective: Vital signs in last 24 hours: Temp:  [99.3 F (37.4 C)-100.9 F (38.3 C)] 100.2 F (37.9 C) (01/17 1900) Pulse Rate:  [82-94] 90 (01/17 1900) Resp:  [15-23] 21 (01/17 1900) BP: (102-134)/(44-61) 120/54 mmHg (01/17 1900) SpO2:  [94 %-100 %] 98 % (01/17 1900) FiO2 (%):  [35 %] 35 % (01/17 1954) Weight:  [47.7 kg (105 lb 2.6 oz)] 47.7 kg (105 lb 2.6 oz) (01/17 0500) Constitutional: Thin, chronically ill appearing, intubated, curled on side. HENT:  [place]/AT, , no scleral icterus Mouth/Throat: Oropharynx ETT in place  Cardiovascular: Normal rate, regular rhythm and normal heart sounds. Pulmonary/Chest: Effort normal Rhonchi Neck = supple, no nuchal rigidity Abdominal: Soft. Bowel sounds are normal. exhibits no distension. There is no tenderness.  Lymphadenopathy: no cervical adenopathy. No axillary adenopathy Neurological:lethargic, sedated, uintubated Skin: Skin is warm and dry. No rash noted. No erythema.  Psychiatric sedated GU foley in place  Lab Results  Recent Labs  01/23/15 0950 01/24/15 0521  01/25/15 0709 01/25/15 0957  WBC 14.5* 14.4*  --   --   --   HGB 9.3* 8.5*  --   --   --   HCT 30.0* 26.4*  --   --   --   NA  --  142  --  136  --   K  --  3.7  < > 5.7* 4.6  CL  --  119*  --  117*  --   CO2  --  17*  --  15*  --   BUN  --  19  --  18  --   CREATININE  --  0.86  --  0.78  --   < > = values in this interval not displayed.  Microbiology: Results for orders placed or performed during the hospital encounter of 01/18/2015  Blood Culture (routine x 2)     Status: None   Collection Time: 01/29/2015  9:20 AM  Result Value Ref Range Status   Specimen Description BLOOD RIGHT ARM  Final   Special Requests   Final    BOTTLES DRAWN AEROBIC AND ANAEROBIC AEROBIC ANAEROBIC   Culture NO GROWTH 5 DAYS  Final   Report Status 01/22/2015 FINAL  Final  Blood Culture (routine x 2)     Status: None   Collection Time: 01/16/2015  9:31 AM  Result Value Ref Range Status   Specimen Description BLOOD LEFT SIDE  Final   Special Requests BOTTLES DRAWN AEROBIC AND ANAEROBIC  Final   Culture NO GROWTH 5 DAYS  Final   Report Status 01/22/2015 FINAL  Final  Urine culture     Status: None   Collection Time: 01/13/2015  9:31 AM  Result Value Ref Range Status   Specimen Description URINE, RANDOM  Final   Special Requests NONE  Final   Culture   Final    >=100,000 COLONIES/mL ESCHERICHIA COLI Results Called to: [facility]  AT 0900 ON 01/19/15 CTJ ESBL-EXTENDED SPECTRUM BETA LACTAMASE-THE ORGANISM IS RESISTANT TO PENICILLINS, CEPHALOSPORINS AND AZTREONAM ACCORDING TO CLSI M100-S15 VOL.25 N01 JAN 2005.    Report Status 01/19/2015 FINAL  Final   Organism ID, Bacteria ESCHERICHIA COLI  Final      Susceptibility   Escherichia coli - MIC*    AMPICILLIN >=32 RESISTANT Resistant     CEFTAZIDIME 4 RESISTANT Resistant     CEFAZOLIN >=64 RESISTANT Resistant     CEFTRIAXONE 32 RESISTANT Resistant     CIPROFLOXACIN >=4 RESISTANT Resistant     GENTAMICIN <=1 SENSITIVE Sensitive     IMIPENEM <=0.25 SENSITIVE Sensitive     TRIMETH/SULFA >=320 RESISTANT Resistant     Extended ESBL POSITIVE Resistant     NITROFURANTOIN Value in next row Sensitive      SENSITIVE<=16    PIP/TAZO Value in next row Sensitive      SENSITIVE<=4    AMPICILLIN/SULBACTAM Value in next row Sensitive      SENSITIVE4    * >=100,000 COLONIES/mL ESCHERICHIA COLI  MRSA PCR Screening     Status: None   Collection Time: 01/22/15  2:35 PM  Result Value Ref Range Status   MRSA by PCR NEGATIVE NEGATIVE Final    Comment:        The GeneXpert MRSA Assay (FDA approved for NASAL specimens only), is one component of a comprehensive MRSA colonization surveillance program. It is not intended to diagnose MRSA infection nor to guide or monitor treatment for MRSA infections.   C difficile quick scan w PCR reflex     Status: None   Collection Time: 01/23/15  3:59 AM  Result Value Ref Range Status   C Diff antigen NEGATIVE NEGATIVE Final   C Diff toxin NEGATIVE NEGATIVE Final   C Diff interpretation Negative for C. difficile  Final  Culture, blood (Routine X 2) w Reflex to ID Panel     Status: None (Preliminary result)   Collection Time: 01/23/15  9:50 AM  Result Value Ref Range Status   Specimen Description BLOOD RIGHT WRIST  Final   Special Requests BOTTLES DRAWN AEROBIC AND ANAEROBIC  1CC  Final   Culture NO GROWTH 2 DAYS  Final   Report Status  PENDING  Incomplete  Culture, blood (Routine X 2) w  Reflex to ID Panel     Status: None (Preliminary result)   Collection Time: 01/23/15  2:25 PM  Result Value Ref Range Status   Specimen Description BLOOD RIGHT FOREARM  Final   Special Requests BOTTLES DRAWN AEROBIC AND ANAEROBIC  1CC  Final   Culture NO GROWTH 2 DAYS  Final   Report Status PENDING  Incomplete  Culture, expectorated sputum-assessment     Status: None   Collection Time: 01/24/15  3:44 PM  Result Value Ref Range Status   Specimen Description TRACHEAL ASPIRATE  Final   Special Requests Normal  Final   Sputum evaluation THIS SPECIMEN IS ACCEPTABLE FOR SPUTUM CULTURE  Final   Report Status 01/25/2015 FINAL  Final  Culture, respiratory (NON-Expectorated)     Status: None (Preliminary result)   Collection Time: 01/24/15  3:44 PM  Result Value Ref Range Status   Specimen Description TRACHEAL ASPIRATE  Final   Special Requests Normal Reflexed from [id number]  Final   Gram Stain PENDING  Incomplete   Culture TOO YOUNG TO READ  Final   Report Status PENDING  Incomplete    Studies/Results: Dg Chest 1 View  01/25/2015  CLINICAL DATA:  Shortness of breath. EXAM: CHEST 1 VIEW COMPARISON:  01/24/2015. FINDINGS: Endotracheal tube and left PICC line stable position. Mediastinum and hilar structures are stable. Progressive bibasilar subsegmental atelectasis. No pleural effusion or pneumothorax. IMPRESSION: 1. Lines and tubes in stable position. 2. Progressive bibasilar subsegmental atelectasis. Electronically Signed   By: Maisie Fus  Register   On: 01/25/2015 07:56   Dg Chest Port 1 View  01/24/2015  CLINICAL DATA: Respiratory failure. EXAM: PORTABLE CHEST 1 VIEW COMPARISON:  01/23/2015. FINDINGS: Endotracheal tube, NG tube, left PICC line in good anatomic position. Mediastinum hilar structures normal. Mild right base subsegmental atelectasis and or infiltrate. No pleural effusion or pneumothorax. IMPRESSION: 1. Lines and tubes stable position.  2. Mild right base subsegmental atelectasis. Electronically Signed   By: Maisie Fus  Register   On: 01/24/2015 07:43    Assessment/Plan: Christine Vance is a 82 y.o. female dementia, admitted with UTI with ESBL E coli. WBC had improved from 14->10 and had defervesced but then code blue 1/14 and intubated, now in unit, on vent. Did have fevers and leukocytosis recur. Initial cxr seemed to show elevation of R hemidiaphragm vs infiltrate -  I suspect aspiration PNA  Recommendations Continue vanco and ertapenem.  Pending trach culture Poor prognosis  Thank you very much for the consult. Will follow with you.  Christine Vance   01/25/2015, 8:57 PM

## 2015-01-25 NOTE — Progress Notes (Signed)
Discussed patient status and increased WOB, will not extubate at this time, continue to monitor.

## 2015-01-25 NOTE — Progress Notes (Signed)
Pharmacy Consult for Electrolyte Monitoring  Allergies  Allergen Reactions  . Codeine Sulfate Other (See Comments)    unknown  . Ivp Dye [Iodinated Diagnostic Agents] Other (See Comments)    unknown  . Lansoprazole Other (See Comments)    unknown  . Losartan Potassium-Hctz Other (See Comments)    unknown  . Penicillins Other (See Comments)    unknown  . Pravachol [Pravastatin] Other (See Comments)    unknown  . Prednisone Other (See Comments)    unknown  . Sulfa Antibiotics Other (See Comments)    unknown  . Sulfonamide Derivatives Other (See Comments)    unknown    Patient Measurements: Height:  (142.2 cm) Weight: 105 lb 2.6 oz (47.7 kg) IBW/kg (Calculated) : 36.3  Vital Signs: Temp: 99.3 F (37.4 C) (01/17 1000) Temp Source: Rectal (01/17 0400) BP: 102/45 mmHg (01/17 1100) Pulse Rate: 82 (01/17 0800) Intake/Output from previous day: 01/16 0701 - 01/17 0700 In: 4155 [I.V.:1725; ZO/XW:9604; IV Piggyback:765] Out: 751 [Urine:750; Stool:1] Intake/Output from this shift:    Labs:  Recent Labs  01/23/15 0523 01/23/15 0950 01/24/15 0521  WBC 15.4* 14.5* 14.4*  HGB 9.9* 9.3* 8.5*  HCT 31.8* 30.0* 26.4*  PLT 383 334 296     Recent Labs  01/22/15 1550  01/23/15 0523 01/24/15 0521 01/24/15 2033 01/25/15 0709 01/25/15 0957  NA  --   --  140 142  --  136  --   K  --   < > 3.4* 3.7 4.8 5.7* 4.6  CL  --   --  115* 119*  --  117*  --   CO2  --   --  15* 17*  --  15*  --   GLUCOSE  --   --  188* 200*  --  154*  --   BUN  --   --  19 19  --  18  --   CREATININE  --   --  1.02* 0.86  --  0.78  --   CALCIUM  --   --  8.2* 8.0*  --  7.0*  --   MG  --   --   --  1.6*  --  1.8  --   PHOS  --   --   --  1.0* 4.2 2.8  --   PROT  --   --   --  5.1*  --   --   --   ALBUMIN  --   --   --  2.0*  --   --   --   AST  --   --   --  51*  --   --   --   ALT  --   --   --  50  --   --   --   ALKPHOS  --   --   --  100  --   --   --   BILITOT  --   --   --  0.9  --    --   --   TRIG 100  --   --   --   --   --   --   < > = values in this interval not displayed. Estimated Creatinine Clearance: 29 mL/min (by C-G formula based on Cr of 0.78).    Recent Labs  01/25/15 0352 01/25/15 0715 01/25/15 1148  GLUCAP 159* 137* 145*    Medical History: Past Medical History  Diagnosis Date  .  Edema leg   . Constipation   . LBBB (left bundle branch block)   . Allergic rhinitis   . Anxiety   . Alzheimer's dementia     Medications:  Scheduled:  . antiseptic oral rinse  7 mL Mouth Rinse QID  . chlorhexidine gluconate  15 mL Mouth Rinse BID  . enoxaparin (LOVENOX) injection  30 mg Subcutaneous Q24H  . ertapenem  0.5 g Intravenous Q24H  . free water  200 mL Per Tube 3 times per day  . insulin aspart  0-9 Units Subcutaneous 6 times per day  . megestrol  400 mg Oral BID  . pantoprazole (PROTONIX) IV  40 mg Intravenous Q24H  . sodium chloride  3 mL Intravenous Q12H  . Vancomycin  750 mg Intravenous Q36H   Infusions:  . sodium chloride 75 mL/hr at 01/25/15 1047  . feeding supplement (VITAL AF 1.2 CAL) 1,000 mL (01/25/15 0900)  . norepinephrine (LEVOPHED) Adult infusion Stopped (01/23/15 1315)  . propofol (DIPRIVAN) infusion Stopped (01/22/15 1627)    Assessment: Pharmacy consulted to assist in managing electrolytes in this 82 y/o F admitted with UTI and respiratory failure due to code blue, PEA.   Plan:  Electrolytes are wnl this am except for K which was elevated but normal upon recheck.  Will f/u am labs.    Luisa Hart D 01/25/2015,12:18 PM

## 2015-01-26 ENCOUNTER — Inpatient Hospital Stay: Payer: Medicare Other

## 2015-01-26 DIAGNOSIS — A419 Sepsis, unspecified organism: Principal | ICD-10-CM

## 2015-01-26 DIAGNOSIS — G9340 Encephalopathy, unspecified: Secondary | ICD-10-CM

## 2015-01-26 DIAGNOSIS — R0600 Dyspnea, unspecified: Secondary | ICD-10-CM

## 2015-01-26 DIAGNOSIS — R6521 Severe sepsis with septic shock: Secondary | ICD-10-CM

## 2015-01-26 LAB — CBC
HCT: 25.5 % — ABNORMAL LOW (ref 35.0–47.0)
Hemoglobin: 8.1 g/dL — ABNORMAL LOW (ref 12.0–16.0)
MCH: 26.6 pg (ref 26.0–34.0)
MCHC: 31.6 g/dL — ABNORMAL LOW (ref 32.0–36.0)
MCV: 84.1 fL (ref 80.0–100.0)
Platelets: 288 10*3/uL (ref 150–440)
RBC: 3.03 MIL/uL — AB (ref 3.80–5.20)
RDW: 17 % — ABNORMAL HIGH (ref 11.5–14.5)
WBC: 11.1 10*3/uL — AB (ref 3.6–11.0)

## 2015-01-26 LAB — BASIC METABOLIC PANEL
ANION GAP: 3 — AB (ref 5–15)
BUN: 18 mg/dL (ref 6–20)
CO2: 17 mmol/L — ABNORMAL LOW (ref 22–32)
Calcium: 8.1 mg/dL — ABNORMAL LOW (ref 8.9–10.3)
Chloride: 116 mmol/L — ABNORMAL HIGH (ref 101–111)
Creatinine, Ser: 0.69 mg/dL (ref 0.44–1.00)
Glucose, Bld: 186 mg/dL — ABNORMAL HIGH (ref 65–99)
POTASSIUM: 4.4 mmol/L (ref 3.5–5.1)
SODIUM: 136 mmol/L (ref 135–145)

## 2015-01-26 LAB — BLOOD GAS, ARTERIAL
ACID-BASE DEFICIT: 3.8 mmol/L — AB (ref 0.0–2.0)
ALLENS TEST (PASS/FAIL): POSITIVE — AB
Bicarbonate: 19.2 mEq/L — ABNORMAL LOW (ref 21.0–28.0)
FIO2: 35
O2 Saturation: 99.2 %
PH ART: 7.46 — AB (ref 7.350–7.450)
PRESSURE SUPPORT: 10 cmH2O
Patient temperature: 37
pCO2 arterial: 27 mmHg — ABNORMAL LOW (ref 32.0–48.0)
pO2, Arterial: 136 mmHg — ABNORMAL HIGH (ref 83.0–108.0)

## 2015-01-26 LAB — GLUCOSE, CAPILLARY
GLUCOSE-CAPILLARY: 137 mg/dL — AB (ref 65–99)
GLUCOSE-CAPILLARY: 149 mg/dL — AB (ref 65–99)
GLUCOSE-CAPILLARY: 160 mg/dL — AB (ref 65–99)
GLUCOSE-CAPILLARY: 163 mg/dL — AB (ref 65–99)
Glucose-Capillary: 163 mg/dL — ABNORMAL HIGH (ref 65–99)
Glucose-Capillary: 155 mg/dL — ABNORMAL HIGH (ref 65–99)

## 2015-01-26 LAB — MAGNESIUM: MAGNESIUM: 1.7 mg/dL (ref 1.7–2.4)

## 2015-01-26 LAB — PHOSPHORUS: Phosphorus: 2.1 mg/dL — ABNORMAL LOW (ref 2.5–4.6)

## 2015-01-26 MED ORDER — ASPIRIN 81 MG PO CHEW
81.0000 mg | CHEWABLE_TABLET | Freq: Every day | ORAL | Status: DC
  Administered 2015-01-26 – 2015-02-01 (×7): 81 mg via ORAL
  Filled 2015-01-26 (×7): qty 1

## 2015-01-26 MED ORDER — SODIUM PHOSPHATE 3 MMOLE/ML IV SOLN
15.0000 mmol | Freq: Once | INTRAVENOUS | Status: AC
  Administered 2015-01-26: 15 mmol via INTRAVENOUS
  Filled 2015-01-26: qty 5

## 2015-01-26 NOTE — Progress Notes (Signed)
Pharmacy Antibiotic Follow-up Note  Christine Vance is a 82 y.o. year-old female admitted on 01/31/2015.  The patient is currently on day 8 of Ertapenem  Q24h for ESBL UTI and Day 5 of Vancomycin for sepsis.  Assessment/Plan: Discussed duration of therapy for ertapenem with Dr. Nicholos Johns who wants a total of 10 days of therapy. Will continue Ertapenem 500 mg iv q 24 hours for 2 more days.After discussion with Dr. Nicholos Johns, will plant to d/c vancomycin concurrently with ertapenem. Will continue Vancomycin   IV Q36h for 1 more dose.      ID following.  Temp (24hrs), Avg:100.1 F (37.8 C), Min:99.3 F (37.4 C), Max:100.9 F (38.3 C)   Recent Labs Lab 01/21/15 0446 01/23/15 0523 01/23/15 0950 01/24/15 0521 01/26/15 0412  WBC 7.0 15.4* 14.5* 14.4* 11.1*     Recent Labs Lab 01/22/15 0503 01/23/15 0523 01/24/15 0521 01/25/15 0709 01/26/15 0412  CREATININE 0.80 1.02* 0.86 0.78 0.69   Estimated Creatinine Clearance: 28.9 mL/min (by C-G formula based on Cr of 0.69).    Allergies  Allergen Reactions  . Codeine Sulfate Other (See Comments)    unknown  . Ivp Dye [Iodinated Diagnostic Agents] Other (See Comments)    unknown  . Lansoprazole Other (See Comments)    unknown  . Losartan Potassium-Hctz Other (See Comments)    unknown  . Penicillins Other (See Comments)    unknown  . Pravachol [Pravastatin] Other (See Comments)    unknown  . Prednisone Other (See Comments)    unknown  . Sulfa Antibiotics Other (See Comments)    unknown  . Sulfonamide Derivatives Other (See Comments)    unknown    Antimicrobials this admission: Aztreonam 1/9 x1 Levaquin 1/9 x1 Vancomycin 1/9 x1 CTX 1/9 >>1/11 Ertapenem  1/11 >>   Vancomycin 1/14 >>    Levels/dose changes this admission:   Microbiology results: 1/9 BCx: negative x 2  1/9 UCx: E.coli ESBL  1/15 UA: 2+LE,TNTC WBC, rare bacteria   Sputum: none 1/14 MRSA PCR: neg 1/15 Cdiff neg 1/15 Blood cx: NGTD x  2 1/16 TA: NGTD  Thank you for allowing pharmacy to be a part of this patient's care.  Luisa Hart D PharmD 01/26/2015 11:07 AM

## 2015-01-26 NOTE — Consult Note (Signed)
Reason for Consult:Altered mental status Referring Physician: Sudini  CC: Altered mental status  HPI: Christine Vance is an 82 y.o. female with a history of dementia admitted on 1/9 with severe dehydration, hypernatremia and UTI.  Patient was unresponsive at the time.  On 1/14 patient experienced a PEA arrest with arrest time of 8-10 minutes.  Patient intubated at that time and was moved to the ICU.  Has been unable to be extubated secondary to mental status.  Renal function initially impaired with BUN/Cr returning to normal on 1/14.  Hypernatremia improved at this time as well.  Patient continues to be febrile with last fever on 1/17.  Followed by ID.  Aspiration PNA suspected as well.    Past Medical History  Diagnosis Date  . Edema leg   . Constipation   . LBBB (left bundle branch block)   . Allergic rhinitis   . Anxiety   . Alzheimer's dementia     Past Surgical History  Procedure Laterality Date  . Cholecystectomy  1960's  . Hemorrhoid surgery  late 1950's  . Other surgery  1960's    hysterectomy    Family History  Problem Relation Age of Onset  . Cancer Mother     colon  . Heart disease Father     MI  . Cancer Maternal Aunt     lung  . Diabetes Neg Hx   . Hypertension Neg Hx   . Coronary artery disease Neg Hx     Social History:  reports that she has quit smoking. She does not have any smokeless tobacco history on file. She reports that she does not drink alcohol. Her drug history is not on file.  Allergies  Allergen Reactions  . Codeine Sulfate Other (See Comments)    unknown  . Ivp Dye [Iodinated Diagnostic Agents] Other (See Comments)    unknown  . Lansoprazole Other (See Comments)    unknown  . Losartan Potassium-Hctz Other (See Comments)    unknown  . Penicillins Other (See Comments)    unknown  . Pravachol [Pravastatin] Other (See Comments)    unknown  . Prednisone Other (See Comments)    unknown  . Sulfa Antibiotics Other (See Comments)     unknown  . Sulfonamide Derivatives Other (See Comments)    unknown    Medications:  I have reviewed the patient's current medications. Prior to Admission:  Prescriptions prior to admission  Medication Sig Dispense Refill Last Dose  . acetaminophen (TYLENOL) 325 MG tablet Take 650 mg by mouth 2 (two) times daily as needed.     Marland Kitchen aspirin 81 MG chewable tablet Chew 81 mg by mouth daily.   01/16/2015 at 1027  . cholecalciferol (VITAMIN D) 1000 units tablet Take 2,000 Units by mouth daily.   01/16/2015 at 1027  . polyethylene glycol powder (MIRALAX) powder Take 17 g by mouth daily.   01/16/2015 at 1759  . ranitidine (ZANTAC) 150 MG tablet Take 150 mg by mouth daily.   02/10/15 at 0504  . senna-docusate (SENOKOT-S) 8.6-50 MG tablet Take 2 tablets by mouth 2 (two) times daily.   01/16/2015 at 1759   Scheduled: . antiseptic oral rinse  7 mL Mouth Rinse QID  . chlorhexidine gluconate  15 mL Mouth Rinse BID  . enoxaparin (LOVENOX) injection  30 mg Subcutaneous Q24H  . ertapenem  0.5 g Intravenous Q24H  . free water  200 mL Per Tube 3 times per day  . insulin aspart  0-9 Units Subcutaneous  6 times per day  . megestrol  400 mg Oral BID  . pantoprazole (PROTONIX) IV  40 mg Intravenous Q24H  . sodium chloride  3 mL Intravenous Q12H  . sodium phosphate  Dextrose 5% IVPB  15 mmol Intravenous Once  . Vancomycin  750 mg Intravenous Q36H    ROS: Unable to obtain secondary to mental status  Physical Examination: Blood pressure 148/59, pulse 92, temperature 99.9 F (37.7 C), temperature source Oral, resp. rate 22, height 4\' 8"  (1.422 m), weight 47.6 kg (104 lb 15 oz), SpO2 97 %.  HEENT-  Normocephalic, no lesions, without obvious abnormality.  Normal external eye and conjunctiva.  Normal TM's bilaterally.  Normal auditory canals and external ears. Normal external nose, mucus membranes and septum.  Normal pharynx. Cardiovascular- S1, S2 normal, pulses palpable throughout   Lungs- chest clear, no wheezing,  rales, normal symmetric air entry Abdomen- soft, non-tender; bowel sounds normal; no masses,  no organomegaly Extremities- BLE edema (L>R) Lymph-no adenopathy palpable Musculoskeletal-no joint tenderness, deformity or swelling Skin-warm and dry, no hyperpigmentation, vitiligo, or suspicious lesions  Neurological Examination Mental Status: Patient does not respond to verbal stimuli.  Grimaces to deep sternal rub.  Does not follow commands.  No verbalizations noted.  Cranial Nerves: II: patient does not respond confrontation bilaterally, pupils right 3 mm, left 3 mm,and reactive bilaterally III,IV,VI: doll's response present bilaterally. V,VII: corneal reflex present bilaterally  VIII: patient does not respond to verbal stimuli IX,X: gag reflex unable to be tested, XI: trapezius strength unable to test bilaterally XII: tongue strength unable to test Motor: Extremities flaccid throughout.  No spontaneous movement or purposeful movements noted in the upper extremities.  In the lower extremities patient with withdrawal to pain.   Sensory: Does not respond to noxious stimuli in the upper extremites.  Withdrawal to pain in the lower extremities Deep Tendon Reflexes:  2+ throughout with absent AJ's bilaterally Plantars: upgoing bilaterally Cerebellar: Unable to perform        Laboratory Studies:   Basic Metabolic Panel:  Recent Labs Lab 01/22/15 0503 01/23/15 0523 01/24/15 0521 01/24/15 2033 01/25/15 0709 01/25/15 0957 01/26/15 0412  NA 137 140 142  --  136  --  136  K 4.1 3.4* 3.7 4.8 5.7* 4.6 4.4  CL 109 115* 119*  --  117*  --  116*  CO2 21* 15* 17*  --  15*  --  17*  GLUCOSE 156* 188* 200*  --  154*  --  186*  BUN 15 19 19   --  18  --  18  CREATININE 0.80 1.02* 0.86  --  0.78  --  0.69  CALCIUM 8.7* 8.2* 8.0*  --  7.0*  --  8.1*  MG  --   --  1.6*  --  1.8  --  1.7  PHOS  --   --  1.0* 4.2 2.8  --  2.1*    Liver Function Tests:  Recent Labs Lab 01/24/15 0521   AST 51*  ALT 50  ALKPHOS 100  BILITOT 0.9  PROT 5.1*  ALBUMIN 2.0*   No results for input(s): LIPASE, AMYLASE in the last 168 hours. No results for input(s): AMMONIA in the last 168 hours.  CBC:  Recent Labs Lab 01/21/15 0446 01/23/15 0523 01/23/15 0950 01/24/15 0521 01/26/15 0412  WBC 7.0 15.4* 14.5* 14.4* 11.1*  NEUTROABS  --   --  11.5*  --   --   HGB 8.8* 9.9* 9.3* 8.5* 8.1*  HCT  27.0* 31.8* 30.0* 26.4* 25.5*  MCV 85.0 84.3 83.8 84.0 84.1  PLT 327 383 334 296 288    Cardiac Enzymes: No results for input(s): CKTOTAL, CKMB, CKMBINDEX, TROPONINI in the last 168 hours.  BNP: Invalid input(s): POCBNP  CBG:  Recent Labs Lab 01/25/15 1943 01/26/15 0006 01/26/15 0426 01/26/15 0711 01/26/15 1123  GLUCAP 155* 149* 163* 163* 137*    Microbiology: Results for orders placed or performed during the hospital encounter of 01/25/2015  Blood Culture (routine x 2)     Status: None   Collection Time: 01/13/2015  9:20 AM  Result Value Ref Range Status   Specimen Description BLOOD RIGHT ARM  Final   Special Requests   Final    BOTTLES DRAWN AEROBIC AND ANAEROBIC AEROBIC ANAEROBIC   Culture NO GROWTH 5 DAYS  Final   Report Status 01/22/2015 FINAL  Final  Blood Culture (routine x 2)     Status: None   Collection Time: 01/26/2015  9:31 AM  Result Value Ref Range Status   Specimen Description BLOOD LEFT SIDE  Final   Special Requests BOTTLES DRAWN AEROBIC AND ANAEROBIC  Final   Culture NO GROWTH 5 DAYS  Final   Report Status 01/22/2015 FINAL  Final  Urine culture     Status: None   Collection Time: 01/13/2015  9:31 AM  Result Value Ref Range Status   Specimen Description URINE, RANDOM  Final   Special Requests NONE  Final   Culture   Final    >=100,000 COLONIES/mL ESCHERICHIA COLI Results Called to: [facility] AT 0900 ON 01/19/15 CTJ ESBL-EXTENDED SPECTRUM BETA LACTAMASE-THE ORGANISM IS RESISTANT TO PENICILLINS, CEPHALOSPORINS AND AZTREONAM ACCORDING TO CLSI  M100-S15 VOL.25 N01 JAN 2005.    Report Status 01/19/2015 FINAL  Final   Organism ID, Bacteria ESCHERICHIA COLI  Final      Susceptibility   Escherichia coli - MIC*    AMPICILLIN >=32 RESISTANT Resistant     CEFTAZIDIME 4 RESISTANT Resistant     CEFAZOLIN >=64 RESISTANT Resistant     CEFTRIAXONE 32 RESISTANT Resistant     CIPROFLOXACIN >=4 RESISTANT Resistant     GENTAMICIN <=1 SENSITIVE Sensitive     IMIPENEM <=0.25 SENSITIVE Sensitive     TRIMETH/SULFA >=320 RESISTANT Resistant     Extended ESBL POSITIVE Resistant     NITROFURANTOIN Value in next row Sensitive      SENSITIVE<=16    PIP/TAZO Value in next row Sensitive      SENSITIVE<=4    AMPICILLIN/SULBACTAM Value in next row Sensitive      SENSITIVE4    * >=100,000 COLONIES/mL ESCHERICHIA COLI  MRSA PCR Screening     Status: None   Collection Time: 01/22/15  2:35 PM  Result Value Ref Range Status   MRSA by PCR NEGATIVE NEGATIVE Final    Comment:        The GeneXpert MRSA Assay (FDA approved for NASAL specimens only), is one component of a comprehensive MRSA colonization surveillance program. It is not intended to diagnose MRSA infection nor to guide or monitor treatment for MRSA infections.   C difficile quick scan w PCR reflex     Status: None   Collection Time: 01/23/15  3:59 AM  Result Value Ref Range Status   C Diff antigen NEGATIVE NEGATIVE Final   C Diff toxin NEGATIVE NEGATIVE Final   C Diff interpretation Negative for C. difficile  Final  Culture, blood (Routine X 2) w Reflex to ID Panel  Status: None (Preliminary result)   Collection Time: 01/23/15  9:50 AM  Result Value Ref Range Status   Specimen Description BLOOD RIGHT WRIST  Final   Special Requests BOTTLES DRAWN AEROBIC AND ANAEROBIC  1CC  Final   Culture NO GROWTH 2 DAYS  Final   Report Status PENDING  Incomplete  Culture, blood (Routine X 2) w Reflex to ID Panel     Status: None (Preliminary result)   Collection Time: 01/23/15  2:25 PM   Result Value Ref Range Status   Specimen Description BLOOD RIGHT FOREARM  Final   Special Requests BOTTLES DRAWN AEROBIC AND ANAEROBIC  1CC  Final   Culture NO GROWTH 2 DAYS  Final   Report Status PENDING  Incomplete  Culture, expectorated sputum-assessment     Status: None   Collection Time: 01/24/15  3:44 PM  Result Value Ref Range Status   Specimen Description TRACHEAL ASPIRATE  Final   Special Requests Normal  Final   Sputum evaluation THIS SPECIMEN IS ACCEPTABLE FOR SPUTUM CULTURE  Final   Report Status 01/25/2015 FINAL  Final  Culture, respiratory (NON-Expectorated)     Status: None (Preliminary result)   Collection Time: 01/24/15  3:44 PM  Result Value Ref Range Status   Specimen Description TRACHEAL ASPIRATE  Final   Special Requests Normal Reflexed from [id number]  Final   Gram Stain PENDING  Incomplete   Culture Consistent with normal respiratory flora.  Final   Report Status PENDING  Incomplete    Coagulation Studies: No results for input(s): LABPROT, INR in the last 72 hours.  Urinalysis:  Recent Labs Lab 01/23/15 1005  COLORURINE AMBER*  LABSPEC 1.020  PHURINE 5.0  GLUCOSEU 50*  HGBUR 2+*  BILIRUBINUR NEGATIVE  KETONESUR TRACE*  PROTEINUR 100*  NITRITE NEGATIVE  LEUKOCYTESUR 2+*    Lipid Panel:     Component Value Date/Time   CHOL 164 02/11/2014 1412   CHOL 139 10/15/2013 0451   TRIG 77 01/25/2015 1432   TRIG 73 10/15/2013 0451   HDL 54.00 02/11/2014 1412   HDL 53 10/15/2013 0451   CHOLHDL 3 02/11/2014 1412   VLDL 29.8 02/11/2014 1412   VLDL 15 10/15/2013 0451   LDLCALC 80 02/11/2014 1412   LDLCALC 71 10/15/2013 0451    HgbA1C:  Lab Results  Component Value Date   HGBA1C 7.1* 01/24/2015    Urine Drug Screen:  No results found for: LABOPIA, COCAINSCRNUR, LABBENZ, AMPHETMU, THCU, LABBARB  Alcohol Level: No results for input(s): ETH in the last 168 hours.  Other results: EKG: (02/06/2015) Sinus rhythm at 86 bpm.  Imaging: Dg Chest 1  View  01/25/2015  CLINICAL DATA:  Shortness of breath. EXAM: CHEST 1 VIEW COMPARISON:  01/24/2015. FINDINGS: Endotracheal tube and left PICC line stable position. Mediastinum and hilar structures are stable. Progressive bibasilar subsegmental atelectasis. No pleural effusion or pneumothorax. IMPRESSION: 1. Lines and tubes in stable position. 2. Progressive bibasilar subsegmental atelectasis. Electronically Signed   By: Maisie Fus  Register   On: 01/25/2015 07:56     Assessment/Plan: 82 year old female with a history of dementia, now intubated after PEA arrest.  Has experienced renal insufficiency, dehydration and hypernatremia.  Also with UTI and PNA, remaining febrile.  Decreased mental status likely secondary to a metabolic encephalopathy.  With her history of dementia would also expect her mental status to lag behind her clinical improvement.  EEG performed and shows triphasic waves which is consistent with this.  Patient has had arrest though.  Brain  stem reflexes intact but can not rule out hypoperfusion injury.  Further work up recommended.    Recommendations: 1.  Agree with head CT  2.  Agree with continued treatment of infection 3.  Will continue to follow with you.     Thana Farr, MD Neurology [phone] 01/26/2015, 1:32 PM

## 2015-01-26 NOTE — Progress Notes (Signed)
[name] Progess Note    ASSESSMENT/PLAN    82 year female past medical history of Alzheimer's dementia, recurrent UTI, currently with ESBL UTI, status post CODE BLUE on 1/14.  Pulmonary: Vent dependent respiratory failure, status post cardiac arrest on 1/14. Acute hypoxic respiratory failure. -The patient has failed multiple weaning trials on consecutive days, likely due to poor airway clearance, and poor mental status.   Neurological:  Anoxic encephalopathy status post cardiac arrest. The patient is not receiving any sedative medications. Advanced Alzheimer's dementia. Fever, which may be neurological in origin. -Patient's reduced mental status has, complicated weaning. We will consult neurology, sent for CT head, may need an EEG.  Infectious: ESBL in urine; with sepsis, now improved. -Continue current course of antibiotics until completed. -Currently on ertapenem end date is 1/20.  Cardiac: PEA arrest times 8-10 minutes Hypotension, likely secondary to recent cardiac arrest. Sepsis seems less likely as a cause for hypotension, at this point as her infection appears to be adequately treated. The patient's white count has come down. P:    RENAL -Electrolytes -ICU electrolyte replacement protocol  GASTROINTESTINAL -OGT -Continue tube feeds.  -GI prophylaxis   ENDOCRINE -ICU hypo/hyperglycemia protocol    SOCIAL - FULL CODE - Palliative Care consulted and following peripherally.   -GI prophylaxis: Protonix. -DVT prophylaxis: Enoxaparin.  ---------------------------------------   ----------------------------------------   Name: Christine Vance MRN: [id number] DOB: 19-Jun-1920    ADMISSION DATE:  02/04/2015    CHIEF COMPLAINT:  Dyspnea  Currently the patient responds to pain, she does not follow any commands.    SUBJECTIVE:   Pt currently on the ventilator, can not provide history or review of systems.     VITAL  SIGNS: Temp:  [99.3 F (37.4 C)-100.9 F (38.3 C)] 99.5 F (37.5 C) (01/18 0800) Pulse Rate:  [83-94] 85 (01/18 0700) Resp:  [15-23] 20 (01/18 0700) BP: (102-146)/(44-67) 139/63 mmHg (01/18 0700) SpO2:  [93 %-100 %] 94 % (01/18 0700) FiO2 (%):  [35 %] 35 % (01/18 0814) Weight:  [104 lb 15 oz (47.6 kg)] 104 lb 15 oz (47.6 kg) (01/18 0459) HEMODYNAMICS:   VENTILATOR SETTINGS: Vent Mode:  [-] Spontaneous FiO2 (%):  [35 %] 35 % Set Rate:  [14 bmp] 14 bmp Vt Set:  [450 mL] 450 mL PEEP:  [5 cmH20] 5 cmH20 Pressure Support:  [5 cmH20-10 cmH20] 10 cmH20 Plateau Pressure:  [14 cmH20] 14 cmH20 INTAKE / OUTPUT:  Intake/Output Summary (Last 24 hours) at 01/26/15 0816 Last data filed at 01/26/15 0800  Gross per 24 hour  Intake 3391.25 ml  Output   1900 ml  Net 1491.25 ml    PHYSICAL EXAMINATION: Physical Examination:   VS: BP 139/63 mmHg  Pulse 85  Temp(Src) 99.5 F (37.5 C) (Oral)  Resp 20  Ht  (1.422 m)  Wt 104 lb 15 oz (47.6 kg)  BMI 23.54 kg/m2  SpO2 94%  General Appearance: No distress  Neuro:without focal findings, HEENT: PERRLA, Pulmonary: normal breath sounds   CardiovascularNormal S1,S2.  No m/r/g.   Abdomen: Benign, Soft, non-tender. Renal:  No costovertebral tenderness  GU:  Not performed at this time. Endocrine: No evident thyromegaly. Skin:   warm, no rashes, no ecchymosis  Extremities: normal, no cyanosis, clubbing.   LABS:   LABORATORY PANEL:   CBC  Recent Labs Lab 01/26/15 0412  WBC 11.1*  HGB 8.1*  HCT 25.5*  PLT 288    Chemistries   Recent Labs Lab 01/24/15 0521  01/26/15 0412  NA 142  < > 136  K 3.7  < > 4.4  CL 119*  < > 116*  CO2 17*  < > 17*  GLUCOSE 200*  < > 186*  BUN 19  < > 18  CREATININE 0.86  < > 0.69  CALCIUM 8.0*  < > 8.1*  MG 1.6*  < > 1.7  PHOS 1.0*  < > 2.1*  AST 51*  --   --   ALT 50  --   --   ALKPHOS 100  --   --   BILITOT 0.9  --   --   < > = values in this interval not displayed.   Recent  Labs Lab 01/25/15 1148 01/25/15 1723 01/25/15 1943 01/26/15 0006 01/26/15 0426 01/26/15 0711  GLUCAP 145* 142* 155* 149* 163* 163*    Recent Labs Lab 01/24/15 1140 01/25/15 0427 01/25/15 0915  PHART 7.45 7.44 7.42  PCO2ART 23* 24* 25*  PO2ART 118* 137* 124*    Recent Labs Lab 01/24/15 0521  AST 51*  ALT 50  ALKPHOS 100  BILITOT 0.9  ALBUMIN 2.0*    Cardiac Enzymes No results for input(s): TROPONINI in the last 168 hours.  RADIOLOGY:  Dg Chest 1 View  01/25/2015  CLINICAL DATA:  Shortness of breath. EXAM: CHEST 1 VIEW COMPARISON:  01/24/2015. FINDINGS: Endotracheal tube and left PICC line stable position. Mediastinum and hilar structures are stable. Progressive bibasilar subsegmental atelectasis. No pleural effusion or pneumothorax. IMPRESSION: 1. Lines and tubes in stable position. 2. Progressive bibasilar subsegmental atelectasis. Electronically Signed   By: Maisie Fus  Register   On: 01/25/2015 07:56       --Wells Guiles, MD.  Pager [phone] [facility]   Santiago Glad, M.D.  Stephanie Acre, M.D.  Billy Fischer, M.D  Critical Care Attestation.  I have personally obtained a history, examined the patient, evaluated laboratory and imaging results, formulated the assessment and plan and placed orders. The Patient requires high complexity decision making for assessment and support, frequent evaluation and titration of therapies, application of advanced monitoring technologies and extensive interpretation of multiple databases. The patient has critical illness that could lead imminently to failure of 1 or more organ systems and requires the highest level of physician preparedness to intervene.  Critical Care Time devoted to patient care services described in this note is 35 minutes and is exclusive of time spent in procedures.

## 2015-01-26 NOTE — Progress Notes (Signed)
Patient continues on ventilator, settings unchanged, open eyes and moves feet to touch and oral care but does not follow commands.  Vital signs stable, sinus rhythm on cardiac monitor.  Tolerating tube feed at goal.  1bm. Foley in place and patent, output adequate urine.  Family updated.  Patient currently in no apparent distress.  RN continue to monitor.

## 2015-01-26 NOTE — Procedures (Signed)
ELECTROENCEPHALOGRAM REPORT   Patient: Christine Vance       Room #: IC01A-AA EEG No. ID: [id number] Age: 82 y.o.        Sex: female Referring Physician: Sudini Report Date:  01/26/2015        Interpreting Physician: Thana Farr  History: Christine Vance is an 82 y.o. female presenting unresponsive now s/p PEA arrest and unresponsive  Medications:  Scheduled: . antiseptic oral rinse  7 mL Mouth Rinse QID  . chlorhexidine gluconate  15 mL Mouth Rinse BID  . enoxaparin (LOVENOX) injection  30 mg Subcutaneous Q24H  . ertapenem  0.5 g Intravenous Q24H  . free water  200 mL Per Tube 3 times per day  . insulin aspart  0-9 Units Subcutaneous 6 times per day  . megestrol  400 mg Oral BID  . pantoprazole (PROTONIX) IV  40 mg Intravenous Q24H  . sodium chloride  3 mL Intravenous Q12H  . sodium phosphate  Dextrose 5% IVPB  15 mmol Intravenous Once  . Vancomycin  750 mg Intravenous Q36H    Conditions of Recording:  This is a 16 channel EEG carried out with the patient in the intubated and unresponsive state.  Description:  The background activity is slow and of low voltage.  It consists mostly of a polymorphic delta activity with some intermixed theta activity seen.  This activity is continuous.  Frequently during the recording are also noted intermittent periodic discharges of triphasic morphology.  These are generalized and on most occasions noted to be synchronous over both hemispheres.    Hyperventilation was not performed.  Intermittent photic stimulation was performed but failed to illicit any change in the recording.    IMPRESSION: This is an abnormal electroencephalogram secondary to general background slowing and frequent triphasic discharges.  This finding is most consistent with an encephalopathy.  Although most commonly associated with hepatic encephalopathies, etiology of encephalopathy may be nonspecific.          Thana Farr, MD Neurology [phone] 01/26/2015, 2:00  PM

## 2015-01-26 NOTE — Progress Notes (Signed)
Notified Dr. Ardyth Man about results of patient's head CT- order for aspirin  daily oral.

## 2015-01-26 NOTE — Progress Notes (Signed)
Pharmacy Consult for Electrolyte Monitoring  Allergies  Allergen Reactions  . Codeine Sulfate Other (See Comments)    unknown  . Ivp Dye [Iodinated Diagnostic Agents] Other (See Comments)    unknown  . Lansoprazole Other (See Comments)    unknown  . Losartan Potassium-Hctz Other (See Comments)    unknown  . Penicillins Other (See Comments)    unknown  . Pravachol [Pravastatin] Other (See Comments)    unknown  . Prednisone Other (See Comments)    unknown  . Sulfa Antibiotics Other (See Comments)    unknown  . Sulfonamide Derivatives Other (See Comments)    unknown    Patient Measurements: Height:  (142.2 cm) Weight: 104 lb 15 oz (47.6 kg) IBW/kg (Calculated) : 36.3  Vital Signs: Temp: 99.5 F (37.5 C) (01/18 0800) Temp Source: Oral (01/18 0700) BP: 129/56 mmHg (01/18 0800) Pulse Rate: 88 (01/18 0800) Intake/Output from previous day: 01/17 0701 - 01/18 0700 In: 3391.3 [I.V.:1516.3; NG/GT:1725; IV Piggyback:150] Out: 1900 [Urine:1900] Intake/Output from this shift:    Labs:  Recent Labs  01/24/15 0521 01/26/15 0412  WBC 14.4* 11.1*  HGB 8.5* 8.1*  HCT 26.4* 25.5*  PLT 296 288     Recent Labs  01/24/15 0521 01/24/15 2033 01/25/15 0709 01/25/15 0957 01/25/15 1432 01/26/15 0412  NA 142  --  136  --   --  136  K 3.7 4.8 5.7* 4.6  --  4.4  CL 119*  --  117*  --   --  116*  CO2 17*  --  15*  --   --  17*  GLUCOSE 200*  --  154*  --   --  186*  BUN 19  --  18  --   --  18  CREATININE 0.86  --  0.78  --   --  0.69  CALCIUM 8.0*  --  7.0*  --   --  8.1*  MG 1.6*  --  1.8  --   --  1.7  PHOS 1.0* 4.2 2.8  --   --  2.1*  PROT 5.1*  --   --   --   --   --   ALBUMIN 2.0*  --   --   --   --   --   AST 51*  --   --   --   --   --   ALT 50  --   --   --   --   --   ALKPHOS 100  --   --   --   --   --   BILITOT 0.9  --   --   --   --   --   TRIG  --   --   --   --  77  --    Estimated Creatinine Clearance: 28.9 mL/min (by C-G formula based on Cr of  0.69).    Recent Labs  01/26/15 0006 01/26/15 0426 01/26/15 0711  GLUCAP 149* 163* 163*    Medical History: Past Medical History  Diagnosis Date  . Edema leg   . Constipation   . LBBB (left bundle branch block)   . Allergic rhinitis   . Anxiety   . Alzheimer's dementia     Medications:  Scheduled:  . antiseptic oral rinse  7 mL Mouth Rinse QID  . chlorhexidine gluconate  15 mL Mouth Rinse BID  . enoxaparin (LOVENOX) injection  30 mg Subcutaneous Q24H  . ertapenem  0.5 g Intravenous Q24H  . free water  200 mL Per Tube 3 times per day  . insulin aspart  0-9 Units Subcutaneous 6 times per day  . megestrol  400 mg Oral BID  . pantoprazole (PROTONIX) IV  40 mg Intravenous Q24H  . sodium chloride  3 mL Intravenous Q12H  . sodium phosphate  Dextrose 5% IVPB  15 mmol Intravenous Once  . Vancomycin  750 mg Intravenous Q36H   Infusions:  . sodium chloride 75 mL/hr at 01/26/15 0241  . feeding supplement (VITAL AF 1.2 CAL) 1,000 mL (01/25/15 1804)  . norepinephrine (LEVOPHED) Adult infusion Stopped (01/23/15 1315)    Assessment: Pharmacy consulted to assist in managing electrolytes in this 82 y/o F admitted with UTI and respiratory failure due to code blue, PEA.   Plan:  Phos is low this am so will replace with sodium phosphate 15 mmol iv once and f/u AM labs.   Christine Vance D 01/26/2015,11:05 AM

## 2015-01-26 NOTE — Progress Notes (Signed)
[facility] Physicians - [place] at [place] [facility]   PATIENT NAME: Christine Vance    MR#:  [id number]  DATE OF BIRTH:  1920/01/15  SUBJECTIVE:  CHIEF COMPLAINT:   Chief Complaint  Patient presents with  . Urinary Tract Infection    unresponsive   Sent from NH- found with UTI, malnourished, and dehydrated.  Patient coded ,PEA , arrest time 8-10 min yesterday and was moved to intensive care unit and got intubated 1/14. Not on any sedation.  35% O2.  REVIEW OF SYSTEMS:   Limited review of systems Review of Systems  Unable to perform ROS   DRUG ALLERGIES:   Allergies  Allergen Reactions  . Codeine Sulfate Other (See Comments)    unknown  . Ivp Dye [Iodinated Diagnostic Agents] Other (See Comments)    unknown  . Lansoprazole Other (See Comments)    unknown  . Losartan Potassium-Hctz Other (See Comments)    unknown  . Penicillins Other (See Comments)    unknown  . Pravachol [Pravastatin] Other (See Comments)    unknown  . Prednisone Other (See Comments)    unknown  . Sulfa Antibiotics Other (See Comments)    unknown  . Sulfonamide Derivatives Other (See Comments)    unknown    VITALS:  Blood pressure 129/56, pulse 88, temperature 99.5 F (37.5 C), temperature source Oral, resp. rate 20, height  (1.422 m), weight 47.6 kg (104 lb 15 oz), SpO2 100 %.  PHYSICAL EXAMINATION:   GENERAL: 82 y.o.-year-old patient lying in the bed with no acute distress.  EYES: Pupils equal, round, reactive to light sluggishly. No scleral icterus.   HEENT: Head atraumatic, normocephalic. ET tube intact . Positive gag reflex per RN report NECK: Supple, no jugular venous distention. No thyroid enlargement, no tenderness.  LUNGS: Diminished  breath sounds bilaterally, no wheezing, rales, rhonchi. No use of accessory muscles of respiration.  CARDIOVASCULAR: S1, S2 normal. No murmurs, rubs, or gallops.  ABDOMEN: Soft, nontender, nondistended. Bowel sounds present. No  organomegaly or mass.  EXTREMITIES: No pedal edema, cyanosis, or clubbing. + 2 pedal & radial pulses b/l. Diffuse muscle wasting NEUROLOGIC: Intubated PSYCHIATRIC: The patient is intubated, not on  Sedatives PICC line in place  Physical Exam LABORATORY PANEL:   CBC  Recent Labs Lab 01/26/15 0412  WBC 11.1*  HGB 8.1*  HCT 25.5*  PLT 288   ------------------------------------------------------------------------------------------------------------------  Chemistries   Recent Labs Lab 01/24/15 0521  01/26/15 0412  NA 142  < > 136  K 3.7  < > 4.4  CL 119*  < > 116*  CO2 17*  < > 17*  GLUCOSE 200*  < > 186*  BUN 19  < > 18  CREATININE 0.86  < > 0.69  CALCIUM 8.0*  < > 8.1*  MG 1.6*  < > 1.7  AST 51*  --   --   ALT 50  --   --   ALKPHOS 100  --   --   BILITOT 0.9  --   --   < > = values in this interval not displayed. ------------------------------------------------------------------------------------------------------------------  Cardiac Enzymes No results for input(s): TROPONINI in the last 168 hours. ------------------------------------------------------------------------------------------------------------------  RADIOLOGY:  Dg Chest 1 View  01/25/2015  CLINICAL DATA:  Shortness of breath. EXAM: CHEST 1 VIEW COMPARISON:  01/24/2015. FINDINGS: Endotracheal tube and left PICC line stable position. Mediastinum and hilar structures are stable. Progressive bibasilar subsegmental atelectasis. No pleural effusion or pneumothorax. IMPRESSION: 1. Lines and tubes in stable  position. 2. Progressive bibasilar subsegmental atelectasis. Electronically Signed   By: Maisie Fus  Register   On: 01/25/2015 07:56    ASSESSMENT AND PLAN:   Active Problems:   UTI (lower urinary tract infection)   Protein-calorie malnutrition, severe   Dyspnea  *Acute hypoxic respiratory failure secondary to aspiration PNA On vancomycin and invanz for broad abx.  * Cardiac arrest  -PEA Intubated Need to assess mental status if extubated FULL CODE Check ECHO  * Acute encephalopathy secondary to sepsis secondary to possible aspiration pneumonia, superimposed on acute cystitis from ESBL/failure to thrive with poor by mouth intake Improved on IV abx but now intubated. Need to reassess once extubated  * Hyponatremia with severe dehydration-resolved with IV fluids  * Severe malnutrition On TFs  * ARF secondary to dehydration from poor by mouth intake Resolved with IV fluids  poor prognosis  CODE STATUS: FULL  TOTAL CRITICAL CARE TIME TAKING CARE OF THIS PATIENT: 33 minutes.    Milagros Loll R M.D on 01/26/2015   Between 7am to 6pm - Pager - [phone]  After 6pm go to [email or web address] - password [email or web address] [facility]  [facility]  Office  [phone]  CC: Primary care physician; Keane Police, MD  Note: This dictation was prepared with Dragon dictation along with smaller phrase technology. Any transcriptional errors that result from this process are unintentional.

## 2015-01-27 ENCOUNTER — Inpatient Hospital Stay: Payer: Medicare Other

## 2015-01-27 ENCOUNTER — Inpatient Hospital Stay
Admit: 2015-01-27 | Discharge: 2015-01-27 | Disposition: A | Discharge status: Home / Self Care | Payer: Medicare Other | Attending: Internal Medicine | Admitting: Internal Medicine

## 2015-01-27 DIAGNOSIS — I6782 Cerebral ischemia: Secondary | ICD-10-CM

## 2015-01-27 DIAGNOSIS — G931 Anoxic brain damage, not elsewhere classified: Secondary | ICD-10-CM

## 2015-01-27 DIAGNOSIS — I6330 Cerebral infarction due to thrombosis of unspecified cerebral artery: Secondary | ICD-10-CM

## 2015-01-27 LAB — GLUCOSE, CAPILLARY
GLUCOSE-CAPILLARY: 139 mg/dL — AB (ref 65–99)
GLUCOSE-CAPILLARY: 145 mg/dL — AB (ref 65–99)
GLUCOSE-CAPILLARY: 147 mg/dL — AB (ref 65–99)
GLUCOSE-CAPILLARY: 155 mg/dL — AB (ref 65–99)
GLUCOSE-CAPILLARY: 161 mg/dL — AB (ref 65–99)
Glucose-Capillary: 140 mg/dL — ABNORMAL HIGH (ref 65–99)
Glucose-Capillary: 160 mg/dL — ABNORMAL HIGH (ref 65–99)

## 2015-01-27 LAB — BASIC METABOLIC PANEL
Anion gap: 7 (ref 5–15)
BUN: 20 mg/dL (ref 6–20)
CALCIUM: 8.2 mg/dL — AB (ref 8.9–10.3)
CO2: 16 mmol/L — ABNORMAL LOW (ref 22–32)
CREATININE: 0.6 mg/dL (ref 0.44–1.00)
Chloride: 113 mmol/L — ABNORMAL HIGH (ref 101–111)
GFR calc Af Amer: >60 mL/min (ref >60)
GLUCOSE: 177 mg/dL — AB (ref 65–99)
POTASSIUM: 4.5 mmol/L (ref 3.5–5.1)
Sodium: 136 mmol/L (ref 135–145)

## 2015-01-27 LAB — MAGNESIUM: Magnesium: 1.7 mg/dL (ref 1.7–2.4)

## 2015-01-27 LAB — PHOSPHORUS: Phosphorus: 2.8 mg/dL (ref 2.5–4.6)

## 2015-01-27 MED ORDER — MIDAZOLAM HCL 5 MG/5ML IJ SOLN: 4.0000 mg | Freq: Once | INTRAMUSCULAR | Status: DC

## 2015-01-27 MED ORDER — ETOMIDATE 2 MG/ML IV SOLN
20.0000 mg | Freq: Once | INTRAVENOUS | Status: AC
  Administered 2015-01-27: 20 mg via INTRAVENOUS

## 2015-01-27 MED ORDER — MIDAZOLAM HCL 2 MG/2ML IJ SOLN
INTRAMUSCULAR | Status: AC
  Administered 2015-01-27: 4 mg
  Filled 2015-01-27: qty 4

## 2015-01-27 NOTE — Progress Notes (Signed)
Patient extubated per Dr. Ardyth Man- Respiratory Angie, Dr. Ardyth Man and I at bedside.  Patient became tachypneic and struggling to breath- Per Dr. Ardyth Man- he re intubated patient at 13:40.  Patient vitals stable- waiting on xray results for ET tube and OG tube placement.

## 2015-01-27 NOTE — Progress Notes (Signed)
[facility] Physicians - [place] at [facility]   PATIENT NAME: Christine Vance    MR#:  [id number]  DATE OF BIRTH:  [birth year]  SUBJECTIVE:  CHIEF COMPLAINT:   Chief Complaint  Patient presents with  . Urinary Tract Infection    unresponsive   Sent from NH- found with UTI, malnourished, and dehydrated.  Patient coded ,PEA , arrest time 8-10 min yesterday and was moved to intensive care unit and got intubated 1/14.Not on any sedation.  Today patient was extubated, but could not tolerate got reintubated. No family members at bedside  REVIEW OF SYSTEMS:   Limited review of systems Review of Systems  Unable to perform ROS   DRUG ALLERGIES:   Allergies  Allergen Reactions  . Codeine Sulfate Other (See Comments)    unknown  . Ivp Dye [Iodinated Diagnostic Agents] Other (See Comments)    unknown  . Lansoprazole Other (See Comments)    unknown  . Losartan Potassium-Hctz Other (See Comments)    unknown  . Penicillins Other (See Comments)    unknown  . Pravachol [Pravastatin] Other (See Comments)    unknown  . Prednisone Other (See Comments)    unknown  . Sulfa Antibiotics Other (See Comments)    unknown  . Sulfonamide Derivatives Other (See Comments)    unknown    VITALS:  Blood pressure 123/61, pulse 89, temperature 100.2 F (37.9 C), temperature source Rectal, resp. rate 21, height  (1.422 m), weight 47.6 kg (104 lb 15 oz), SpO2 97 %.  PHYSICAL EXAMINATION:   GENERAL: 82 y.o.-year-old patient lying in the bed with no acute distress.  EYES: Pupils equal, round, reactive to light sluggishly. No scleral icterus.   HEENT: Head atraumatic, normocephalic. ET tube intact . Positive gag reflex per RN report NECK: Supple, no jugular venous distention. No thyroid enlargement, no tenderness.  LUNGS: Diminished  breath sounds bilaterally, no wheezing, rales, rhonchi. No use of accessory muscles of respiration.  CARDIOVASCULAR: S1, S2 normal. No murmurs,  rubs, or gallops.  ABDOMEN: Soft, nontender, nondistended. Bowel sounds present. No organomegaly or mass.  EXTREMITIES: No pedal edema, cyanosis, or clubbing. + 2 pedal & radial pulses b/l. Diffuse muscle wasting NEUROLOGIC: Intubated PSYCHIATRIC: The patient is intubated, not on  Sedatives PICC line in place  Physical Exam LABORATORY PANEL:   CBC  Recent Labs Lab 01/26/15 0412  WBC 11.1*  HGB 8.1*  HCT 25.5*  PLT 288   ------------------------------------------------------------------------------------------------------------------  Chemistries   Recent Labs Lab 01/24/15 0521  01/27/15 0405  NA 142  < > 136  K 3.7  < > 4.5  CL 119*  < > 113*  CO2 17*  < > 16*  GLUCOSE 200*  < > 177*  BUN 19  < > 20  CREATININE 0.86  < > 0.60  CALCIUM 8.0*  < > 8.2*  MG 1.6*  < > 1.7  AST 51*  --   --   ALT 50  --   --   ALKPHOS 100  --   --   BILITOT 0.9  --   --   < > = values in this interval not displayed. ------------------------------------------------------------------------------------------------------------------  Cardiac Enzymes No results for input(s): TROPONINI in the last 168 hours. ------------------------------------------------------------------------------------------------------------------  RADIOLOGY:  Dg Abd 1 View  01/27/2015  CLINICAL DATA:  Status post orogastric tube placement. EXAM: ABDOMEN - 1 VIEW COMPARISON:  01/22/2015. FINDINGS: Normal bowel gas pattern. Orogastric tube tip in the distal stomach. Endotracheal tube tip in  satisfactory position. Breathing motion blurring at the left lung base. Grossly clear lungs. Normal sized heart. IMPRESSION: Orogastric tube tip in the distal stomach.  No acute abnormality. Electronically Signed   By: Beckie Salts M.D.   On: 01/27/2015 14:18   Ct Head Wo Contrast  01/26/2015  CLINICAL DATA:  82 year old female status post cardiac arrest 4 days ago. Now all febrile. Unresponsive. Initial encounter. EXAM: CT HEAD  WITHOUT CONTRAST TECHNIQUE: Contiguous axial images were obtained from the base of the skull through the vertex without intravenous contrast. COMPARISON:  02/08/2015, 02/11/2014. FINDINGS: Mild bubbly opacity in both sphenoid sinuses. Other Visualized paranasal sinuses and mastoids are clear. No acute osseous abnormality identified. No acute orbit or scalp soft tissue findings. Calcified atherosclerosis at the skull base. Stable cerebral volume with no intracranial mass effect identified. Stable ventricle size and configuration. No ventriculomegaly. Basilar cisterns remain patent. No acute intracranial hemorrhage identified. There is a small lacunar infarct of the left caudate nucleus evident which is new. There is also subtle new hypodensity in the left parietal lobe (series 6, image 12). Elsewhere gray-white matter differentiation appears stable. IMPRESSION: 1. Evidence of small evolving infarcts in the left caudate nucleus and parietal lobe (left MCA territory). No associated hemorrhage or mass effect. 2. No evidence of diffuse cerebral edema at this time. Electronically Signed   By: [place] Fleming M.D.   On: 01/26/2015 14:52   Dg Chest Port 1 View  01/27/2015  CLINICAL DATA:  Hypoxia EXAM: PORTABLE CHEST 1 VIEW COMPARISON:  January 25, 2015 FINDINGS: Endotracheal tube tip is 2.7 cm above the carina. Central catheter tip is in the superior vena cava. Nasogastric tube tip and side port are in the stomach. No pneumothorax. There is no edema or consolidation. Heart size and pulmonary vascularity are normal. No adenopathy. There is atherosclerotic calcification in the aorta. No bone lesions are apparent. IMPRESSION: Tube and catheter positions as described without pneumothorax. No edema or consolidation. Electronically Signed   By: Bretta Bang III M.D.   On: 01/27/2015 14:11    ASSESSMENT AND PLAN:   Active Problems:   UTI (lower urinary tract infection)   Protein-calorie malnutrition, severe    Dyspnea  *Acute hypoxic respiratory failure secondary to aspiration PNA On vancomycin and invanz for broad abx.  * Cardiac arrest -PEA Intubated Extubated today1/19 by intensivist but got reintubated as patient could not tolerate FULL CODE Check ECHO  * Acute encephalopathy secondary to sepsis secondary to possible aspiration pneumonia, superimposed on acute cystitis from ESBL/failure to thrive with poor by mouth intake Improved on IV abx but now intubated. Need to reassess once extubated  * Hyponatremia with severe dehydration-resolved with IV fluids  * Severe malnutrition On TFs  * ARF secondary to dehydration from poor by mouth intake Resolved with IV fluids  poor prognosis  CODE STATUS: FULL  TOTAL CRITICAL CARE TIME TAKING CARE OF THIS PATIENT: .    Ramonita Lab M.D on 01/27/2015   Between 7am to 6pm - Pager - [phone]  After 6pm go to [email or web address] - password [email or web address] [facility]  [place] [facility]  Office  [phone]  CC: Primary care physician; Keane Police, MD  Note: This dictation was prepared with Dragon dictation along with smaller phrase technology. Any transcriptional errors that result from this process are unintentional.

## 2015-01-27 NOTE — Progress Notes (Signed)
RT placed patient in SBT 5/5 per verbal order from Dr. Ardyth Man.  Tolerating change well at this time.  Will continue to monitor.

## 2015-01-27 NOTE — Progress Notes (Addendum)
n

## 2015-01-27 NOTE — Progress Notes (Signed)
Subjective: Patient remains intubated.  Somewhat more responsive.  Objective: Current vital signs: BP 123/61 mmHg  Pulse 89  Temp(Src) 100.2 F (37.9 C) (Rectal)  Resp 21  Ht  (1.422 m)  Wt 47.6 kg (104 lb 15 oz)  BMI 23.54 kg/m2  SpO2 97% Vital signs in last 24 hours: Temp:  [97.7 F (36.5 C)-100.2 F (37.9 C)] 100.2 F (37.9 C) (01/19 1300) Pulse Rate:  [78-98] 89 (01/19 1300) Resp:  [12-22] 21 (01/19 1300) BP: (111-141)/(51-101) 123/61 mmHg (01/19 1300) SpO2:  [96 %-100 %] 97 % (01/19 1300) FiO2 (%):  [35 %-40 %] 35 % (01/19 1103)  Intake/Output from previous day: 01/18 0701 - 01/19 0700 In: 4727.5 [P.O.:1400; I.V.:1400; NG/GT:1627.5; IV Piggyback:300] Out: 2330 [Urine:2330] Intake/Output this shift: Total I/O In: 275 [I.V.:75; IV Piggyback:200] Out: 560 [Urine:560] Nutritional status:    Neurologic Exam: Mental Status: Patient does not respond to verbal stimuli. Grimaces to deep sternal rub and raises head. Does not follow commands. No verbalizations noted.  Cranial Nerves: II: patient does not respond confrontation bilaterally, pupils right 3 mm, left 3 mm,and reactive bilaterally III,IV,VI: doll's response present bilaterally. V,VII: corneal reflex present bilaterally  VIII: patient does not respond to verbal stimuli IX,X: gag reflex unable to be tested, XI: trapezius strength unable to test bilaterally XII: tongue strength unable to test Motor: Extremities flaccid throughout. No spontaneous movement or purposeful movements noted in the upper extremities. In the lower extremities patient with withdrawal to pain.  Sensory: Does not respond to noxious stimuli in the upper extremites. Withdrawal to pain in the lower extremities   Lab Results: Basic Metabolic Panel:  Recent Labs Lab 01/23/15 0523 01/24/15 0521 01/24/15 2033 01/25/15 0709 01/25/15 0957 01/26/15 0412 01/27/15 0405  NA 140 142  --  136  --  136 136  K 3.4* 3.7 4.8 5.7* 4.6  4.4 4.5  CL 115* 119*  --  117*  --  116* 113*  CO2 15* 17*  --  15*  --  17* 16*  GLUCOSE 188* 200*  --  154*  --  186* 177*  BUN 19 19  --  18  --  18 20  CREATININE 1.02* 0.86  --  0.78  --  0.69 0.60  CALCIUM 8.2* 8.0*  --  7.0*  --  8.1* 8.2*  MG  --  1.6*  --  1.8  --  1.7 1.7  PHOS  --  1.0* 4.2 2.8  --  2.1* 2.8    Liver Function Tests:  Recent Labs Lab 01/24/15 0521  AST 51*  ALT 50  ALKPHOS 100  BILITOT 0.9  PROT 5.1*  ALBUMIN 2.0*   No results for input(s): LIPASE, AMYLASE in the last 168 hours. No results for input(s): AMMONIA in the last 168 hours.  CBC:  Recent Labs Lab 01/21/15 0446 01/23/15 0523 01/23/15 0950 01/24/15 0521 01/26/15 0412  WBC 7.0 15.4* 14.5* 14.4* 11.1*  NEUTROABS  --   --  11.5*  --   --   HGB 8.8* 9.9* 9.3* 8.5* 8.1*  HCT 27.0* 31.8* 30.0* 26.4* 25.5*  MCV 85.0 84.3 83.8 84.0 84.1  PLT 327 383 334 296 288    Cardiac Enzymes: No results for input(s): CKTOTAL, CKMB, CKMBINDEX, TROPONINI in the last 168 hours.  Lipid Panel:  Recent Labs Lab 01/22/15 1550 01/25/15 1432  TRIG 100 77    CBG:  Recent Labs Lab 01/27/15 0010 01/27/15 0349 01/27/15 0352 01/27/15 0803 01/27/15 1132  GLUCAP  161* 140* 160* 147* 155*    Microbiology: Results for orders placed or performed during the hospital encounter of 01/11/2015  Blood Culture (routine x 2)     Status: None   Collection Time: 02/05/2015  9:20 AM  Result Value Ref Range Status   Specimen Description BLOOD RIGHT ARM  Final   Special Requests   Final    BOTTLES DRAWN AEROBIC AND ANAEROBIC AEROBIC ANAEROBIC   Culture NO GROWTH 5 DAYS  Final   Report Status 01/22/2015 FINAL  Final  Blood Culture (routine x 2)     Status: None   Collection Time: 01/16/2015  9:31 AM  Result Value Ref Range Status   Specimen Description BLOOD LEFT SIDE  Final   Special Requests BOTTLES DRAWN AEROBIC AND ANAEROBIC  Final   Culture NO GROWTH 5 DAYS  Final   Report Status 01/22/2015  FINAL  Final  Urine culture     Status: None   Collection Time: 01/19/2015  9:31 AM  Result Value Ref Range Status   Specimen Description URINE, RANDOM  Final   Special Requests NONE  Final   Culture   Final    >=100,000 COLONIES/mL ESCHERICHIA COLI Results Called to: [facility] AT 0900 ON 01/19/15 CTJ ESBL-EXTENDED SPECTRUM BETA LACTAMASE-THE ORGANISM IS RESISTANT TO PENICILLINS, CEPHALOSPORINS AND AZTREONAM ACCORDING TO CLSI M100-S15 VOL.25 N01 JAN 2005.    Report Status 01/19/2015 FINAL  Final   Organism ID, Bacteria ESCHERICHIA COLI  Final      Susceptibility   Escherichia coli - MIC*    AMPICILLIN >=32 RESISTANT Resistant     CEFTAZIDIME 4 RESISTANT Resistant     CEFAZOLIN >=64 RESISTANT Resistant     CEFTRIAXONE 32 RESISTANT Resistant     CIPROFLOXACIN >=4 RESISTANT Resistant     GENTAMICIN <=1 SENSITIVE Sensitive     IMIPENEM <=0.25 SENSITIVE Sensitive     TRIMETH/SULFA >=320 RESISTANT Resistant     Extended ESBL POSITIVE Resistant     NITROFURANTOIN Value in next row Sensitive      SENSITIVE<=16    PIP/TAZO Value in next row Sensitive      SENSITIVE<=4    AMPICILLIN/SULBACTAM Value in next row Sensitive      SENSITIVE4    * >=100,000 COLONIES/mL ESCHERICHIA COLI  MRSA PCR Screening     Status: None   Collection Time: 01/22/15  2:35 PM  Result Value Ref Range Status   MRSA by PCR NEGATIVE NEGATIVE Final    Comment:        The GeneXpert MRSA Assay (FDA approved for NASAL specimens only), is one component of a comprehensive MRSA colonization surveillance program. It is not intended to diagnose MRSA infection nor to guide or monitor treatment for MRSA infections.   C difficile quick scan w PCR reflex     Status: None   Collection Time: 01/23/15  3:59 AM  Result Value Ref Range Status   C Diff antigen NEGATIVE NEGATIVE Final   C Diff toxin NEGATIVE NEGATIVE Final   C Diff interpretation Negative for C. difficile  Final  Culture, blood (Routine X 2) w Reflex to  ID Panel     Status: None (Preliminary result)   Collection Time: 01/23/15  9:50 AM  Result Value Ref Range Status   Specimen Description BLOOD RIGHT WRIST  Final   Special Requests BOTTLES DRAWN AEROBIC AND ANAEROBIC  1CC  Final   Culture NO GROWTH 4 DAYS  Final   Report Status PENDING  Incomplete  Culture, blood (Routine X 2) w Reflex to ID Panel     Status: None (Preliminary result)   Collection Time: 01/23/15  2:25 PM  Result Value Ref Range Status   Specimen Description BLOOD RIGHT FOREARM  Final   Special Requests BOTTLES DRAWN AEROBIC AND ANAEROBIC  1CC  Final   Culture NO GROWTH 4 DAYS  Final   Report Status PENDING  Incomplete  Culture, expectorated sputum-assessment     Status: None   Collection Time: 01/24/15  3:44 PM  Result Value Ref Range Status   Specimen Description TRACHEAL ASPIRATE  Final   Special Requests Normal  Final   Sputum evaluation THIS SPECIMEN IS ACCEPTABLE FOR SPUTUM CULTURE  Final   Report Status 01/25/2015 FINAL  Final  Culture, respiratory (NON-Expectorated)     Status: None (Preliminary result)   Collection Time: 01/24/15  3:44 PM  Result Value Ref Range Status   Specimen Description TRACHEAL ASPIRATE  Final   Special Requests Normal Reflexed from [id number]  Final   Gram Stain   Final    EXCELLENT SPECIMEN - 90-100% WBCS MODERATE WBC SEEN FEW YEAST    Culture Consistent with normal respiratory flora.  Final   Report Status PENDING  Incomplete    Coagulation Studies: No results for input(s): LABPROT, INR in the last 72 hours.  Imaging: Dg Abd 1 View  01/27/2015  CLINICAL DATA:  Status post orogastric tube placement. EXAM: ABDOMEN - 1 VIEW COMPARISON:  01/22/2015. FINDINGS: Normal bowel gas pattern. Orogastric tube tip in the distal stomach. Endotracheal tube tip in satisfactory position. Breathing motion blurring at the left lung base. Grossly clear lungs. Normal sized heart. IMPRESSION: Orogastric tube tip in the distal stomach.  No acute  abnormality. Electronically Signed   By: Beckie Salts M.D.   On: 01/27/2015 14:18   Ct Head Wo Contrast  01/26/2015  CLINICAL DATA:  82 year old female status post cardiac arrest 4 days ago. Now all febrile. Unresponsive. Initial encounter. EXAM: CT HEAD WITHOUT CONTRAST TECHNIQUE: Contiguous axial images were obtained from the base of the skull through the vertex without intravenous contrast. COMPARISON:  01/30/2015, 02/11/2014. FINDINGS: Mild bubbly opacity in both sphenoid sinuses. Other Visualized paranasal sinuses and mastoids are clear. No acute osseous abnormality identified. No acute orbit or scalp soft tissue findings. Calcified atherosclerosis at the skull base. Stable cerebral volume with no intracranial mass effect identified. Stable ventricle size and configuration. No ventriculomegaly. Basilar cisterns remain patent. No acute intracranial hemorrhage identified. There is a small lacunar infarct of the left caudate nucleus evident which is new. There is also subtle new hypodensity in the left parietal lobe (series 6, image 12). Elsewhere gray-white matter differentiation appears stable. IMPRESSION: 1. Evidence of small evolving infarcts in the left caudate nucleus and parietal lobe (left MCA territory). No associated hemorrhage or mass effect. 2. No evidence of diffuse cerebral edema at this time. Electronically Signed   By: [place] Fleming M.D.   On: 01/26/2015 14:52   Dg Chest Port 1 View  01/27/2015  CLINICAL DATA:  Hypoxia EXAM: PORTABLE CHEST 1 VIEW COMPARISON:  January 25, 2015 FINDINGS: Endotracheal tube tip is 2.7 cm above the carina. Central catheter tip is in the superior vena cava. Nasogastric tube tip and side port are in the stomach. No pneumothorax. There is no edema or consolidation. Heart size and pulmonary vascularity are normal. No adenopathy. There is atherosclerotic calcification in the aorta. No bone lesions are apparent. IMPRESSION: Tube and catheter positions as  described without  pneumothorax. No edema or consolidation. Electronically Signed   By: Bretta Bang III M.D.   On: 01/27/2015 14:11    Medications:  I have reviewed the patient's current medications. Scheduled: . antiseptic oral rinse  7 mL Mouth Rinse QID  . aspirin  81 mg Oral Daily  . chlorhexidine gluconate  15 mL Mouth Rinse BID  . enoxaparin (LOVENOX) injection  30 mg Subcutaneous Q24H  . ertapenem  0.5 g Intravenous Q24H  . free water  200 mL Per Tube 3 times per day  . insulin aspart  0-9 Units Subcutaneous 6 times per day  . megestrol  400 mg Oral BID  . midazolam  4 mg Intravenous Once  . pantoprazole (PROTONIX) IV  40 mg Intravenous Q24H  . sodium chloride  3 mL Intravenous Q12H    Assessment/Plan: Remains poorly responsive.  Continues to have low grade temps.  Head CT shows hypodensities suggestive of evolving infarcts in the left caudate nucleus and parietal lobe.  Echocardiogram shows borderline LVH, moderate AR and mld MR.  Patient on ASA.    Recommendations: 1.  Agree with current management.    LOS: 10 days   Thana Farr, MD Neurology [phone] 01/27/2015  3:03 PM

## 2015-01-27 NOTE — Clinical Documentation Improvement (Signed)
Internal Medicine Neurology  Documentation states patient is "nonresponsive" on the VENT post cardiac arrest. Please clarify in next Note and DS if this equates to "coma".  Please exercise your independent, professional judgment when responding. A specific answer is not anticipated or expected.  Thank You, Beverley Fiedler RN [facility] [place] [phone]

## 2015-01-27 NOTE — Progress Notes (Signed)
Patient reintubated by Dr. Ardyth Man on 1st attempt with 7.5 ETT at 24 at lip using glidoscope with blade.  Patient tolerated reintubation well, good color change on capno, equal BBS, tube stabilized.  Will continue to monitor.

## 2015-01-27 NOTE — Procedures (Signed)
  Patient was extubated today after tolerating a pressure support and PEEP 5 over 5, trial with inhaled tidal volumes of partially 400-500 mL. After extubation, the patient began to gasp for air, she had no audible stridor, she simply here to be very dyspneic. There did not appear to be excess secretions. She was reintubated within 10 minutes. No cord edema was noted on reintubation, though there was very sluggish movements of the cords. -The patient was given 4 mg of Versed and 20 mg of etomidate. -The patient had some biting movements with attempted intubation, she did not spontaneously move her limbs. -I attempted to reach the family to update the power of attorney, she did not answer her phone at cell or home.  Endotracheal Intubation: Patient required placement of an artificial airway secondary to resp failure.   Consent: Emergent.   Hand washing performed prior to starting the procedure.   Medications administered for sedation prior to procedure: Midazolam 4 mg IV,  Vecuronium 10 mg IV, Fentanyl 100 mcg IV.   Procedure: A time out procedure was called and correct patient, name, & ID confirmed. Needed supplies and equipment were assembled and checked to include ETT, 10 ml syringe, Glidescope, Mac and Miller blades, suction, oxygen and bag mask valve, end tidal CO2 monitor. Patient was positioned to align the mouth and pharynx to facilitate visualization of the glottis.  Heart rate, SpO2 and blood pressure was continuously monitored during the procedure. Pre-oxygenation was conducted prior to intubation and endotracheal tube was placed through the vocal cords into the trachea.  During intubation an assistant applied gentle pressure to the cricoid cartilage.   The artificial airway was placed under direct visualization via glidescope route using a ETT on the first attempt.    ETT was secured at 24 cm mark.    Placement was confirmed by auscuitation of lungs with good breath sounds bilaterally  and no stomach sounds.  Condensation was noted on endotracheal tube.  Pulse ox %.  CO2 detector in place with appropriate color change.   Complications: None .   Operator: Nicholos Johns.   Chest radiograph ordered and pending.     Wells Guiles, M.D.

## 2015-01-27 NOTE — Progress Notes (Signed)
Pharmacy Consult for Electrolyte Monitoring  Allergies  Allergen Reactions  . Codeine Sulfate Other (See Comments)    unknown  . Ivp Dye [Iodinated Diagnostic Agents] Other (See Comments)    unknown  . Lansoprazole Other (See Comments)    unknown  . Losartan Potassium-Hctz Other (See Comments)    unknown  . Penicillins Other (See Comments)    unknown  . Pravachol [Pravastatin] Other (See Comments)    unknown  . Prednisone Other (See Comments)    unknown  . Sulfa Antibiotics Other (See Comments)    unknown  . Sulfonamide Derivatives Other (See Comments)    unknown    Patient Measurements: Height:  (142.2 cm) Weight: 104 lb 15 oz (47.6 kg) IBW/kg (Calculated) : 36.3  Vital Signs: Temp: 99.5 F (37.5 C) (01/19 0600) Temp Source: Rectal (01/19 0400) BP: 133/61 mmHg (01/19 0600) Pulse Rate: 90 (01/19 0600) Intake/Output from previous day: 01/18 0701 - 01/19 0700 In: 4607.5 [P.O.:1400; I.V.:1325; NG/GT:1582.5; IV Piggyback:300] Out: 2330 [Urine:2330] Intake/Output from this shift:    Labs:  Recent Labs  01/26/15 0412  WBC 11.1*  HGB 8.1*  HCT 25.5*  PLT 288     Recent Labs  01/25/15 0709 01/25/15 0957 01/25/15 1432 01/26/15 0412 01/27/15 0405  NA 136  --   --  136 136  K 5.7* 4.6  --  4.4 4.5  CL 117*  --   --  116* 113*  CO2 15*  --   --  17* 16*  GLUCOSE 154*  --   --  186* 177*  BUN 18  --   --  18 20  CREATININE 0.78  --   --  0.69 0.60  CALCIUM 7.0*  --   --  8.1* 8.2*  MG 1.8  --   --  1.7 1.7  PHOS 2.8  --   --  2.1* 2.8  TRIG  --   --  77  --   --    Estimated Creatinine Clearance: 28.9 mL/min (by C-G formula based on Cr of 0.6).    Recent Labs  01/27/15 0010 01/27/15 0349 01/27/15 0352  GLUCAP 161* 140* 160*    Medical History: Past Medical History  Diagnosis Date  . Edema leg   . Constipation   . LBBB (left bundle branch block)   . Allergic rhinitis   . Anxiety   . Alzheimer's dementia     Medications:   Scheduled:  . antiseptic oral rinse  7 mL Mouth Rinse QID  . aspirin  81 mg Oral Daily  . chlorhexidine gluconate  15 mL Mouth Rinse BID  . enoxaparin (LOVENOX) injection  30 mg Subcutaneous Q24H  . ertapenem  0.5 g Intravenous Q24H  . free water  200 mL Per Tube 3 times per day  . insulin aspart  0-9 Units Subcutaneous 6 times per day  . megestrol  400 mg Oral BID  . pantoprazole (PROTONIX) IV  40 mg Intravenous Q24H  . sodium chloride  3 mL Intravenous Q12H  . Vancomycin  750 mg Intravenous Q36H   Infusions:  . sodium chloride 75 mL/hr at 01/26/15 0241  . feeding supplement (VITAL AF 1.2 CAL) 1,000 mL (01/26/15 2000)  . norepinephrine (LEVOPHED) Adult infusion Stopped (01/23/15 1315)    Assessment: Pharmacy consulted to assist in managing electrolytes in this 82 y/o F admitted with UTI and respiratory failure due to code blue, PEA.   Plan:  Electrolytes WNL. Will recheck tomorrow with AM labs.  Carola Frost, Pharm.D., BCPS Clinical Pharmacist 01/27/2015,7:10 AM

## 2015-01-27 NOTE — Progress Notes (Signed)
Spoke to Dr. Dellie Catholic with [facility]- he verified correct placement of ET tube, OG and PICC.

## 2015-01-27 NOTE — Progress Notes (Signed)
[facility] Critical Care Medicine Progess Note    ASSESSMENT/PLAN    82 year female past medical history of Alzheimer's dementia, recurrent UTI, currently with ESBL UTI, status post CODE BLUE on 1/14.  Pulmonary: Vent dependent respiratory failure, status post cardiac arrest on 1/14. Acute hypoxic respiratory failure. -The patient has failed multiple weaning trials on consecutive days, likely due to poor airway clearance, and poor mental status. Will attempt weaning again today, and extubate if tolerated.   Neurological:  Anoxic encephalopathy status post cardiac arrest. The patient is not receiving any sedative medications. Advanced Alzheimer's dementia. No further fevers overnight. Discussed with neurology, the patient continued to have reduced mental status, likely from anoxic and metabolic encephalopathy. -We'll continue to hold sedation and monitor mental status.  Infectious: ESBL in urine; with sepsis, now improved. -Continue current course of antibiotics until completed. -Currently on ertapenem and vancomycin end date is 1/20.  Cardiac: PEA arrest times 8-10 minutes Hypotension, likely secondary to recent cardiac arrest. Sepsis seems less likely as a cause for hypotension, at this point as her infection appears to be adequately treated. The patient's white count has come down. P:    RENAL -Electrolytes -ICU electrolyte replacement protocol  GASTROINTESTINAL -OGT -Continue tube feeds.  -GI prophylaxis   ENDOCRINE -ICU hypo/hyperglycemia protocol    SOCIAL - FULL CODE - Palliative Care consulted and following peripherally. -The patient's next of kin is the patient's daughter-in-law, who is also her power of attorney and has been making medical decisions, she has persisted in keeping the patient full code.   -GI prophylaxis: Protonix. -DVT prophylaxis:  Enoxaparin.  ---------------------------------------   ----------------------------------------   Name: Christine Vance MRN: [id number] DOB: 11-17-1920    ADMISSION DATE:  01/15/2015    CHIEF COMPLAINT:  Dyspnea  Currently the patient responds to pain, she does have some spontaneous movements she does not follow any commands.    SUBJECTIVE:   Pt currently on the ventilator, can not provide history or review of systems.     VITAL SIGNS: Temp:  [97.7 F (36.5 C)-100 F (37.8 C)] 99.5 F (37.5 C) (01/19 0600) Pulse Rate:  [78-98] 90 (01/19 0600) Resp:  [12-22] 21 (01/19 0600) BP: (109-148)/(51-101) 133/61 mmHg (01/19 0600) SpO2:  [96 %-100 %] 100 % (01/19 0600) FiO2 (%):  [35 %-40 %] 35 % (01/19 0752) HEMODYNAMICS:   VENTILATOR SETTINGS: Vent Mode:  [-] PRVC FiO2 (%):  [35 %-40 %] 35 % Set Rate:  [10 bmp] 10 bmp Vt Set:  [450 mL-500 mL] 500 mL PEEP:  [5 cmH20] 5 cmH20 Plateau Pressure:  [16 cmH20] 16 cmH20 INTAKE / OUTPUT:  Intake/Output Summary (Last 24 hours) at 01/27/15 0943 Last data filed at 01/27/15 0852  Gross per 24 hour  Intake 4062.5 ml  Output   2610 ml  Net 1452.5 ml    PHYSICAL EXAMINATION: Physical Examination:   VS: BP 133/61 mmHg  Pulse 90  Temp(Src) 99.5 F (37.5 C) (Rectal)  Resp 21  Ht  (1.422 m)  Wt 104 lb 15 oz (47.6 kg)  BMI 23.54 kg/m2  SpO2 100%  General Appearance: No distress  Neuro:without focal findings, HEENT: PERRLA, Pulmonary: normal breath sounds   CardiovascularNormal S1,S2.  No m/r/g.   Abdomen: Benign, Soft, non-tender. Renal:  No costovertebral tenderness  GU:  Not performed at this time. Endocrine: No evident thyromegaly. Skin:   warm, no rashes, no ecchymosis  Extremities: normal, no cyanosis, clubbing.   LABS:   LABORATORY PANEL:  CBC  Recent Labs Lab 01/26/15 0412  WBC 11.1*  HGB 8.1*  HCT 25.5*  PLT 288    Chemistries   Recent Labs Lab 01/24/15 0521  01/27/15 0405  NA 142  <  > 136  K 3.7  < > 4.5  CL 119*  < > 113*  CO2 17*  < > 16*  GLUCOSE 200*  < > 177*  BUN 19  < > 20  CREATININE 0.86  < > 0.60  CALCIUM 8.0*  < > 8.2*  MG 1.6*  < > 1.7  PHOS 1.0*  < > 2.8  AST 51*  --   --   ALT 50  --   --   ALKPHOS 100  --   --   BILITOT 0.9  --   --   < > = values in this interval not displayed.   Recent Labs Lab 01/26/15 1624 01/26/15 1952 01/27/15 0010 01/27/15 0349 01/27/15 0352 01/27/15 0803  GLUCAP 160* 155* 161* 140* 160* 147*    Recent Labs Lab 01/25/15 0427 01/25/15 0915 01/26/15 0913  PHART 7.44 7.42 7.46*  PCO2ART 24* 25* 27*  PO2ART 137* 124* 136*    Recent Labs Lab 01/24/15 0521  AST 51*  ALT 50  ALKPHOS 100  BILITOT 0.9  ALBUMIN 2.0*    Cardiac Enzymes No results for input(s): TROPONINI in the last 168 hours.  RADIOLOGY:  Ct Head Wo Contrast  01/26/2015  CLINICAL DATA:  82 year old female status post cardiac arrest 4 days ago. Now all febrile. Unresponsive. Initial encounter. EXAM: CT HEAD WITHOUT CONTRAST TECHNIQUE: Contiguous axial images were obtained from the base of the skull through the vertex without intravenous contrast. COMPARISON:  01/29/2015, 02/11/2014. FINDINGS: Mild bubbly opacity in both sphenoid sinuses. Other Visualized paranasal sinuses and mastoids are clear. No acute osseous abnormality identified. No acute orbit or scalp soft tissue findings. Calcified atherosclerosis at the skull base. Stable cerebral volume with no intracranial mass effect identified. Stable ventricle size and configuration. No ventriculomegaly. Basilar cisterns remain patent. No acute intracranial hemorrhage identified. There is a small lacunar infarct of the left caudate nucleus evident which is new. There is also subtle new hypodensity in the left parietal lobe (series 6, image 12). Elsewhere gray-white matter differentiation appears stable. IMPRESSION: 1. Evidence of small evolving infarcts in the left caudate nucleus and parietal lobe  (left MCA territory). No associated hemorrhage or mass effect. 2. No evidence of diffuse cerebral edema at this time. Electronically Signed   By: Odessa Fleming M.D.   On: 01/26/2015 14:52       --Wells Guiles, MD.  Pager [phone] [facility]   Santiago Glad, M.D.  Stephanie Acre, M.D.  Billy Fischer, M.D  Critical Care Attestation.  I have personally obtained a history, examined the patient, evaluated laboratory and imaging results, formulated the assessment and plan and placed orders. The Patient requires high complexity decision making for assessment and support, frequent evaluation and titration of therapies, application of advanced monitoring technologies and extensive interpretation of multiple databases. The patient has critical illness that could lead imminently to failure of 1 or more organ systems and requires the highest level of physician preparedness to intervene.  Critical Care Time devoted to patient care services described in this note is 35 minutes and is exclusive of time spent in procedures.

## 2015-01-27 NOTE — Progress Notes (Signed)
Nutrition Follow-up  DOCUMENTATION CODES:   Severe malnutrition in context of chronic illness  INTERVENTION:   EN: if unable to extubate, recommend continuing current TF regimen  NUTRITION DIAGNOSIS:   Inadequate oral intake related to inability to eat as evidenced by NPO status.  GOAL:   Patient will meet greater than or equal to 90% of their needs  MONITOR:    (Energy Intake, Anthropometrics, Electrolyte and rneal Profile, Digestive System, Glucose Profile)  REASON FOR ASSESSMENT:   Consult Poor PO  ASSESSMENT:   Pt admitted with AMS and UTI. Pt with h/o dementia, dysphagia and was on honey thickened liquids PTA. Palliative Care consulted.  Pt on vent this AM, plan for trial of extubation today  EN: tolerating Vital 1.2 AF at rate of 45 ml/hr  Meds: NS at 75 ml/hr, ss novolog, megace, levophed Last BM:  1/19    Electrolyte and Renal Profile:  Recent Labs Lab 01/25/15 0709 01/25/15 0957 01/26/15 0412 01/27/15 0405  BUN 18  --  18 20  CREATININE 0.78  --  0.69 0.60  NA 136  --  136 136  K 5.7* 4.6 4.4 4.5  MG 1.8  --  1.7 1.7  PHOS 2.8  --  2.1* 2.8   Glucose Profile:  Recent Labs  01/27/15 0352 01/27/15 0803 01/27/15 1132  GLUCAP 160* 147* 155*   Height:   Ht Readings from Last 1 Encounters:  01/25/15  (1.422 m)    Weight:   Wt Readings from Last 1 Encounters:  01/26/15 104 lb 15 oz (47.6 kg)   BMI:  Body mass index is 23.54 kg/(m^2).  Estimated Nutritional Needs:   Kcal:  TEE 1340 kcals/d (Ve 16, Tmax 38.3)  Protein:  (1.2-2.0 g/kg) 50-84 g/d)  Fluid:  (25-62ml/kg) 1050-1250ml/d  EDUCATION NEEDS:   No education needs identified at this time  HIGH Care Level  Romelle Starcher MS, RD, LDN [phone] Pager  [phone] Weekend/On-Call Pager

## 2015-01-27 NOTE — Progress Notes (Signed)
RT to room to extubate patient per verbal order from Dr. Ardyth Man.  Patient suctioned prior to extubation, extubated and placed on 2LPM [place] with Dr. Ardyth Man at bedside.  Patient immediately began gasping, O2 sats remained mid 90's, Dr. Ardyth Man ordered reintubation.

## 2015-01-27 NOTE — Progress Notes (Signed)
*  PRELIMINARY RESULTS* Echocardiogram 2D Echocardiogram has been performed.  Christine Vance Hege 01/27/2015, 3:09 PM

## 2015-01-28 ENCOUNTER — Inpatient Hospital Stay: Payer: Medicare Other

## 2015-01-28 ENCOUNTER — Inpatient Hospital Stay: Admit: 2015-01-28 | Payer: Medicare Other

## 2015-01-28 LAB — CULTURE, RESPIRATORY: CULTURE: NORMAL

## 2015-01-28 LAB — CULTURE, BLOOD (ROUTINE X 2)
Culture: NO GROWTH
Culture: NO GROWTH

## 2015-01-28 LAB — GLUCOSE, CAPILLARY
GLUCOSE-CAPILLARY: 147 mg/dL — AB (ref 65–99)
GLUCOSE-CAPILLARY: 148 mg/dL — AB (ref 65–99)
GLUCOSE-CAPILLARY: 152 mg/dL — AB (ref 65–99)
GLUCOSE-CAPILLARY: 152 mg/dL — AB (ref 65–99)
GLUCOSE-CAPILLARY: 157 mg/dL — AB (ref 65–99)
Glucose-Capillary: 172 mg/dL — ABNORMAL HIGH (ref 65–99)
Glucose-Capillary: 153 mg/dL — ABNORMAL HIGH (ref 65–99)

## 2015-01-28 LAB — PHOSPHORUS: PHOSPHORUS: 2.6 mg/dL (ref 2.5–4.6)

## 2015-01-28 LAB — BASIC METABOLIC PANEL
Anion gap: 4 — ABNORMAL LOW (ref 5–15)
BUN: 20 mg/dL (ref 6–20)
CHLORIDE: 109 mmol/L (ref 101–111)
CO2: 20 mmol/L — ABNORMAL LOW (ref 22–32)
Calcium: 8.2 mg/dL — ABNORMAL LOW (ref 8.9–10.3)
Creatinine, Ser: 0.63 mg/dL (ref 0.44–1.00)
Glucose, Bld: 168 mg/dL — ABNORMAL HIGH (ref 65–99)
POTASSIUM: 4 mmol/L (ref 3.5–5.1)
SODIUM: 133 mmol/L — AB (ref 135–145)

## 2015-01-28 LAB — CBC
HCT: 23.8 % — ABNORMAL LOW (ref 35.0–47.0)
HEMOGLOBIN: 7.5 g/dL — AB (ref 12.0–16.0)
MCH: 26.1 pg (ref 26.0–34.0)
MCHC: 31.6 g/dL — AB (ref 32.0–36.0)
MCV: 82.4 fL (ref 80.0–100.0)
PLATELETS: 346 10*3/uL (ref 150–440)
RBC: 2.89 MIL/uL — AB (ref 3.80–5.20)
RDW: 17.3 % — ABNORMAL HIGH (ref 11.5–14.5)
WBC: 10.7 10*3/uL (ref 3.6–11.0)

## 2015-01-28 LAB — CULTURE, RESPIRATORY W GRAM STAIN: Special Requests: NORMAL

## 2015-01-28 LAB — MAGNESIUM: MAGNESIUM: 1.5 mg/dL — AB (ref 1.7–2.4)

## 2015-01-28 MED ORDER — MAGNESIUM SULFATE 4 GM/100ML IV SOLN
4.0000 g | Freq: Once | INTRAVENOUS | Status: AC
  Administered 2015-01-28: 4 g via INTRAVENOUS
  Filled 2015-01-28: qty 100

## 2015-01-28 NOTE — Progress Notes (Signed)
Pharmacy Consult for Electrolyte Monitoring  Allergies  Allergen Reactions  . Codeine Sulfate Other (See Comments)    unknown  . Ivp Dye [Iodinated Diagnostic Agents] Other (See Comments)    unknown  . Lansoprazole Other (See Comments)    unknown  . Losartan Potassium-Hctz Other (See Comments)    unknown  . Penicillins Other (See Comments)    unknown  . Pravachol [Pravastatin] Other (See Comments)    unknown  . Prednisone Other (See Comments)    unknown  . Sulfa Antibiotics Other (See Comments)    unknown  . Sulfonamide Derivatives Other (See Comments)    unknown    Patient Measurements: Height:  (142.2 cm) Weight: 104 lb 15 oz (47.6 kg) IBW/kg (Calculated) : 36.3  Vital Signs: Temp: 99.3 F (37.4 C) (01/20 0200) Temp Source: Rectal (01/20 0000) BP: 112/53 mmHg (01/20 0200) Pulse Rate: 87 (01/20 0200) Intake/Output from previous day: 01/19 0701 - 01/20 0700 In: 2525 [I.V.:1030; NG/GT:1295; IV Piggyback:200] Out: 1720 [Urine:1720] Intake/Output from this shift: Total I/O In: 1125 [I.V.:525; NG/GT:600] Out: 560 [Urine:560]  Labs:  Recent Labs  01/26/15 0412 01/28/15 0300  WBC 11.1* 10.7  HGB 8.1* 7.5*  HCT 25.5* 23.8*  PLT 288 346     Recent Labs  01/25/15 1432 01/26/15 0412 01/27/15 0405 01/28/15 0300  NA  --  136 136 133*  K  --  4.4 4.5 4.0  CL  --  116* 113* 109  CO2  --  17* 16* 20*  GLUCOSE  --  186* 177* 168*  BUN  --  CREATININE  --  0.69 0.60 0.63  CALCIUM  --  8.1* 8.2* 8.2*  MG  --  1.7 1.7 1.5*  PHOS  --  2.1* 2.8 2.6  TRIG 77  --   --   --    Estimated Creatinine Clearance: 28.9 mL/min (by C-G formula based on Cr of 0.63).    Recent Labs  01/27/15 1925 01/28/15 0029 01/28/15 0332  GLUCAP 145* 147* 152*    Medical History: Past Medical History  Diagnosis Date  . Edema leg   . Constipation   . LBBB (left bundle branch block)   . Allergic rhinitis   . Anxiety   . Alzheimer's dementia      Medications:  Scheduled:  . antiseptic oral rinse  7 mL Mouth Rinse QID  . aspirin  81 mg Oral Daily  . chlorhexidine gluconate  15 mL Mouth Rinse BID  . enoxaparin (LOVENOX) injection  30 mg Subcutaneous Q24H  . ertapenem  0.5 g Intravenous Q24H  . free water  200 mL Per Tube 3 times per day  . insulin aspart  0-9 Units Subcutaneous 6 times per day  . megestrol  400 mg Oral BID  . midazolam  4 mg Intravenous Once  . pantoprazole (PROTONIX) IV  40 mg Intravenous Q24H  . sodium chloride  3 mL Intravenous Q12H   Infusions:  . sodium chloride 75 mL/hr at 01/28/15 0317  . feeding supplement (VITAL AF 1.2 CAL) 1,000 mL (01/28/15 0200)  . norepinephrine (LEVOPHED) Adult infusion Stopped (01/23/15 1315)    Assessment: Pharmacy consulted to assist in managing electrolytes in this 82 y/o F admitted with UTI and respiratory failure due to code blue, PEA.   Plan:  K 4, Phos 2.6, Mg 1.5. Ordered magnesium sulfate 4 gm IV x 1. Will reassess tomorrow with AM labs.  Carola Frost, Pharm.D., BCPS Clinical Pharmacist 01/28/2015,3:43  AM

## 2015-01-28 NOTE — Progress Notes (Signed)
Nutrition Follow-up  DOCUMENTATION CODES:   Severe malnutrition in context of chronic illness  INTERVENTION:   EN: recommend continuing current TF regimen; continue to assess   NUTRITION DIAGNOSIS:   Inadequate oral intake related to inability to eat as evidenced by NPO status.  GOAL:   Patient will meet greater than or equal to 90% of their needs  MONITOR:    (Energy Intake, Anthropometrics, Electrolyte and rneal Profile, Digestive System, Glucose Profile)  REASON FOR ASSESSMENT:   Consult Poor PO  ASSESSMENT:   Pt admitted with AMS and UTI. Pt with h/o dementia, dysphagia and was on honey thickened liquids PTA. Palliative Care consulted.  Pt failed trial of extubation yesterday, required reintubation within 5 minutes, will likely need trach and PEG per MD notes  EN: tolerating Vital 1.2 at rate of 45 ml/hr  Digestive System: no signs of TF intolerance Last BM:  1/19    Electrolyte and Renal Profile:  Recent Labs Lab 01/26/15 0412 01/27/15 0405 01/28/15 0300  BUN CREATININE 0.69 0.60 0.63  NA 136 136 133*  K 4.4 4.5 4.0  MG 1.7 1.7 1.5*  PHOS 2.1* 2.8 2.6   Glucose Profile:  Recent Labs  01/28/15 0332 01/28/15 0721 01/28/15 1139  GLUCAP 152* 172* 157*   Meds: NS at 75 ml/hr, ss novolog  Height:   Ht Readings from Last 1 Encounters:  01/25/15  (1.422 m)    Weight:   Wt Readings from Last 1 Encounters:  01/28/15 102 lb 4.7 oz (46.4 kg)    BMI:  Body mass index is 22.95 kg/(m^2).  Estimated Nutritional Needs:   Kcal:  TEE 1340 kcals/d (Ve 16, Tmax 38.3)  Protein:  (1.2-2.0 g/kg) 50-84 g/d)  Fluid:  (25-73ml/kg) 1050-1279ml/d  EDUCATION NEEDS:   No education needs identified at this time   HIGH Care Level  Romelle Starcher MS, RD, LDN [phone] Pager  [phone] Weekend/On-Call Pager

## 2015-01-28 NOTE — Progress Notes (Signed)
Chaplain rounded in the unit and offered a compassionate presence and silent prayer. [employer] [phone]

## 2015-01-28 NOTE — Progress Notes (Signed)
[facility] - [place] [facility]   PATIENT NAME: Christine Vance    MR#:  [id number]  DATE OF BIRTH:  06-30-1920  SUBJECTIVE:  CHIEF COMPLAINT:   Chief Complaint  Patient presents with  . Urinary Tract Infection    unresponsive   Sent from NH- found with UTI, malnourished, and dehydrated.  Patient coded ,PEA , arrest time 8-10 min  and was moved to intensive care unit and got intubated 1/14.Not on any sedation.  1/19 patient was extubated, but could not tolerate got reintubated. No family members at bedside,couldnt reach daughter-in-law  REVIEW OF SYSTEMS:   Limited review of systems Review of Systems  Unable to perform ROS   DRUG ALLERGIES:   Allergies  Allergen Reactions  . Codeine Sulfate Other (See Comments)    unknown  . Ivp Dye [Iodinated Diagnostic Agents] Other (See Comments)    unknown  . Lansoprazole Other (See Comments)    unknown  . Losartan Potassium-Hctz Other (See Comments)    unknown  . Penicillins Other (See Comments)    unknown  . Pravachol [Pravastatin] Other (See Comments)    unknown  . Prednisone Other (See Comments)    unknown  . Sulfa Antibiotics Other (See Comments)    unknown  . Sulfonamide Derivatives Other (See Comments)    unknown    VITALS:  Blood pressure 116/48, pulse 82, temperature 99.7 F (37.6 C), temperature source Rectal, resp. rate 18, height  (1.422 m), weight 46.4 kg (102 lb 4.7 oz), SpO2 100 %.  PHYSICAL EXAMINATION:   GENERAL: 82 y.o.-year-old patient lying in the bed with no acute distress.  EYES: Pupils equal, round, reactive to light sluggishly. No scleral icterus.   HEENT: Head atraumatic, normocephalic. ET tube intact . Positive gag reflex per RN report NECK: Supple, no jugular venous distention. No thyroid enlargement, no tenderness.  LUNGS: Diminished  breath sounds bilaterally, no wheezing, rales, rhonchi. No use of accessory muscles of respiration.  CARDIOVASCULAR: S1,  S2 normal. No murmurs, rubs, or gallops.  ABDOMEN: Soft, nontender, nondistended. Bowel sounds present. No organomegaly or mass.  EXTREMITIES: peripheral  edema, no cyanosis, or clubbing. + 2 pedal & radial pulses b/l. Diffuse muscle wasting  NEUROLOGIC: Intubated PSYCHIATRIC: The patient is intubated, not on  Sedatives PICC line in place  Physical Exam LABORATORY PANEL:   CBC  Recent Labs Lab 01/28/15 0300  WBC 10.7  HGB 7.5*  HCT 23.8*  PLT 346   ------------------------------------------------------------------------------------------------------------------  Chemistries   Recent Labs Lab 01/24/15 0521  01/28/15 0300  NA 142  < > 133*  K 3.7  < > 4.0  CL 119*  < > 109  CO2 17*  < > 20*  GLUCOSE 200*  < > 168*  BUN 19  < > 20  CREATININE 0.86  < > 0.63  CALCIUM 8.0*  < > 8.2*  MG 1.6*  < > 1.5*  AST 51*  --   --   ALT 50  --   --   ALKPHOS 100  --   --   BILITOT 0.9  --   --   < > = values in this interval not displayed. ------------------------------------------------------------------------------------------------------------------  Cardiac Enzymes No results for input(s): TROPONINI in the last 168 hours. ------------------------------------------------------------------------------------------------------------------  RADIOLOGY:  Dg Chest 1 View  01/28/2015  CLINICAL DATA:  Shortness of breath . EXAM: CHEST 1 VIEW COMPARISON:  01/27/2015 P FINDINGS: Endotracheal tube tip 1.3 cm above the carina. Proximal repositioning of 2  cm suggested. NG tube and left PICC line stable position. Mediastinum and hilar structures are unremarkable. Heart size normal. Low lung volumes with bibasilar subsegmental atelectasis. Left upper lobe mild subsegmental atelectasis and or infiltrate cannot be excluded. Small left pleural effusion. No pneumothorax. IMPRESSION: 1. Endotracheal tube tip 1.3 cm above the carina. Proximal repositioning of 2 cm suggested. 2. NG tube and left PICC  line stable position. 3. Low lung volumes with mild bibasilar atelectasis. Mild left upper lobe subsegmental atelectasis and or infiltrate cannot be excluded. Electronically Signed   By: Maisie Fus  Register   On: 01/28/2015 07:40   Dg Abd 1 View  01/27/2015  CLINICAL DATA:  Status post orogastric tube placement. EXAM: ABDOMEN - 1 VIEW COMPARISON:  01/22/2015. FINDINGS: Normal bowel gas pattern. Orogastric tube tip in the distal stomach. Endotracheal tube tip in satisfactory position. Breathing motion blurring at the left lung base. Grossly clear lungs. Normal sized heart. IMPRESSION: Orogastric tube tip in the distal stomach.  No acute abnormality. Electronically Signed   By: Beckie Salts M.D.   On: 01/27/2015 14:18   Ct Head Wo Contrast  01/26/2015  CLINICAL DATA:  82 year old female status post cardiac arrest 4 days ago. Now all febrile. Unresponsive. Initial encounter. EXAM: CT HEAD WITHOUT CONTRAST TECHNIQUE: Contiguous axial images were obtained from the base of the skull through the vertex without intravenous contrast. COMPARISON:  01/17/2015, 02/11/2014. FINDINGS: Mild bubbly opacity in both sphenoid sinuses. Other Visualized paranasal sinuses and mastoids are clear. No acute osseous abnormality identified. No acute orbit or scalp soft tissue findings. Calcified atherosclerosis at the skull base. Stable cerebral volume with no intracranial mass effect identified. Stable ventricle size and configuration. No ventriculomegaly. Basilar cisterns remain patent. No acute intracranial hemorrhage identified. There is a small lacunar infarct of the left caudate nucleus evident which is new. There is also subtle new hypodensity in the left parietal lobe (series 6, image 12). Elsewhere gray-white matter differentiation appears stable. IMPRESSION: 1. Evidence of small evolving infarcts in the left caudate nucleus and parietal lobe (left MCA territory). No associated hemorrhage or mass effect. 2. No evidence of diffuse  cerebral edema at this time. Electronically Signed   By: Odessa Fleming M.D.   On: 01/26/2015 14:52   Dg Chest Port 1 View  01/27/2015  CLINICAL DATA:  Hypoxia EXAM: PORTABLE CHEST 1 VIEW COMPARISON:  January 25, 2015 FINDINGS: Endotracheal tube tip is 2.7 cm above the carina. Central catheter tip is in the superior vena cava. Nasogastric tube tip and side port are in the stomach. No pneumothorax. There is no edema or consolidation. Heart size and pulmonary vascularity are normal. No adenopathy. There is atherosclerotic calcification in the aorta. No bone lesions are apparent. IMPRESSION: Tube and catheter positions as described without pneumothorax. No edema or consolidation. Electronically Signed   By: Bretta Bang III M.D.   On: 01/27/2015 14:11    ASSESSMENT AND PLAN:   Active Problems:   UTI (lower urinary tract infection)   Protein-calorie malnutrition, severe   Dyspnea  *Acute hypoxic respiratory failure secondary to aspiration PNA On vancomycin and invanz for broad abx. Vancomycin discontinued and continue Invanz   * Cardiac arrest -PEA Intubated Extubated 1/19 by intensivist but got reintubated as patient could not tolerate FULL CODE Check ECHO  * Acute encephalopathy secondary to sepsis secondary to possible aspiration pneumonia, superimposed on acute cystitis from ESBL/failure to thrive with poor by mouth intake Improved on IV abx but now intubated. Need to reassess once  extubated  * Hyponatremia with severe dehydration-resolved with IV fluids  * Severe malnutrition On TFs  * ARF secondary to dehydration from poor by mouth intake Resolved with IV fluids  poor prognosis  Disposition-social worker called her daughter-in-law to have a family meeting before tomorrow afternoon. If daughter-in-law insists the patient to be full code, patient probably needs tracheostomy and PEG tube and LTAC  CODE STATUS: FULL  TOTAL CRITICAL CARE TIME TAKING CARE OF THIS PATIENT: .     Ramonita Lab M.D on 01/28/2015   Between 7am to 6pm - Pager - [phone]  After 6pm go to [email or web address] - password [email or web address] [facility]  [facility]  Office  [phone]  CC: Primary care physician; Keane Police, MD  Note: This dictation was prepared with Dragon dictation along with smaller phrase technology. Any transcriptional errors that result from this process are unintentional.

## 2015-01-28 NOTE — Clinical Social Work Note (Signed)
CSW continues to follow. Patient remains on vent. Palliative Care spoke with family and they are wanting to continue with aggressive care.  York Spaniel MSW,LCSW [phone]

## 2015-01-28 NOTE — Progress Notes (Signed)
Spoke to the patient's daughter-in-law Justin Mend), on behalf of Dr. Sung Amabile. He asked if she could plan to be at the hospital anytime before noon tomorrow morning (1/21). Sheree is agreeable and stated she will be in tomorrow in the morning to speak with the doctor.

## 2015-01-28 NOTE — Progress Notes (Signed)
[facility] [place] Critical Care Medicine Progess Note    ASSESSMENT/PLAN    82 year female past medical history of Alzheimer's dementia, recurrent UTI, currently with ESBL UTI, status post CODE BLUE on 1/14.  Pulmonary: Vent dependent respiratory failure, status post cardiac arrest on 1/14. Acute hypoxic respiratory failure. Failed trial extubation yesterday, will likely need trach and PEG. -The patient has failed multiple weaning trials on consecutive days, likely due to poor airway clearance, and poor mental status. Will attempt weaning again today, and extubate if tolerated. -I had a long discussion with the patient's daughter-in-law, who is her power of attorney. I informed her that the patient failed her weaning trial, within 5 minutes. Subsequently, it appears that she has some degree of anoxic brain injury, combined with her  age-related emphysema, and cardiac injury from the recent code, I do not believe that she can be weaned from the vent in a safe manner. I informed her that her mother-in-law will likely persist in a chronic vegetative state, and will likely need tracheostomy and feeding tube will need to be transferred to a chronic vent facility once she is stable to be see if she can be weaned further. I felt this is the safest way to wean her from the ventilator, if in fact she can be weaned at all. She expressed that she would like to consider whether she wants her mother-in-law to go through all of these things, she expressed that she has very strong convictions that she thinks her mother-in-law should be treated with all possible therapies and that she does not want to make her DO NOT RESUSCITATE. However, she would like to consider this over the weekend, she will call on Monday with her decision.   Neurological:  Anoxic encephalopathy status post cardiac arrest. The patient is not receiving any sedative medications. Advanced Alzheimer's dementia. -We'll continue to hold sedation  and monitor mental status.  Infectious: ESBL in urine; with sepsis, now improved. -Continue current course of antibiotics until completed. -Currently on ertapenem and vancomycin end date is 1/20.  Cardiac: PEA arrest times 8-10 minutes Hypotension, likely secondary to recent cardiac arrest. Sepsis seems less likely as a cause for hypotension, at this point as her infection appears to be adequately treated. The patient's white count has come down. P:    RENAL -Electrolytes -ICU electrolyte replacement protocol  GASTROINTESTINAL -OGT -Continue tube feeds.  -GI prophylaxis   ENDOCRINE -ICU hypo/hyperglycemia protocol    SOCIAL - FULL CODE - Palliative Care consulted and following peripherally. -The patient's next of kin is the patient's daughter-in-law, who is also her power of attorney and has been making medical decisions, she has persisted in keeping the patient full code.   -GI prophylaxis: Protonix. -DVT prophylaxis: Enoxaparin.  ---------------------------------------   ----------------------------------------   Name: Christine Vance MRN: [id number] DOB: Sep 17, 1920    ADMISSION DATE:  01/22/15    CHIEF COMPLAINT:  Dyspnea  Currently the patient responds to pain, she does have some spontaneous movements she does not follow any commands.    SUBJECTIVE:   Pt currently on the ventilator, can not provide history or review of systems.     VITAL SIGNS: Temp:  [99 F (37.2 C)-100.2 F (37.9 C)] 99.7 F (37.6 C) (01/20 1100) Pulse Rate:  [76-169] 78 (01/20 1100) Resp:  [13-28] 18 (01/20 1100) BP: (99-147)/(44-66) 103/44 mmHg (01/20 1100) SpO2:  [93 %-100 %] 100 % (01/20 1100) FiO2 (%):  [30 %-35 %] 30 % (01/20 1040) Weight:  [578  lb 4.7 oz (46.4 kg)] 102 lb 4.7 oz (46.4 kg) (01/20 4098) HEMODYNAMICS:   VENTILATOR SETTINGS: Vent Mode:  [-] PRVC FiO2 (%):  [30 %-35 %] 30 % Set Rate:  [10 bmp] 10 bmp Vt Set:  [450 mL] 450 mL PEEP:  [5 cmH20] 5  cmH20 Plateau Pressure:  [15 cmH20] 15 cmH20 INTAKE / OUTPUT:  Intake/Output Summary (Last 24 hours) at 01/28/15 1132 Last data filed at 01/28/15 0900  Gross per 24 hour  Intake 3516.25 ml  Output   1895 ml  Net 1621.25 ml    PHYSICAL EXAMINATION: Physical Examination:   VS: BP 103/44 mmHg  Pulse 78  Temp(Src) 99.7 F (37.6 C) (Rectal)  Resp 18  Ht  (1.422 m)  Wt 102 lb 4.7 oz (46.4 kg)  BMI 22.95 kg/m2  SpO2 100%  General Appearance: No distress  Neuro:without focal findings, HEENT: PERRLA, Pulmonary: normal breath sounds   CardiovascularNormal S1,S2.  No m/r/g.   Abdomen: Benign, Soft, non-tender. Renal:  No costovertebral tenderness  GU:  Not performed at this time. Endocrine: No evident thyromegaly. Skin:   warm, no rashes, no ecchymosis  Extremities: normal, no cyanosis, clubbing.   LABS:   LABORATORY PANEL:   CBC  Recent Labs Lab 01/28/15 0300  WBC 10.7  HGB 7.5*  HCT 23.8*  PLT 346    Chemistries   Recent Labs Lab 01/24/15 0521  01/28/15 0300  NA 142  < > 133*  K 3.7  < > 4.0  CL 119*  < > 109  CO2 17*  < > 20*  GLUCOSE 200*  < > 168*  BUN 19  < > 20  CREATININE 0.86  < > 0.63  CALCIUM 8.0*  < > 8.2*  MG 1.6*  < > 1.5*  PHOS 1.0*  < > 2.6  AST 51*  --   --   ALT 50  --   --   ALKPHOS 100  --   --   BILITOT 0.9  --   --   < > = values in this interval not displayed.   Recent Labs Lab 01/27/15 1132 01/27/15 1620 01/27/15 1925 01/28/15 0029 01/28/15 0332 01/28/15 0721  GLUCAP 155* 139* 145* 147* 152* 172*    Recent Labs Lab 01/25/15 0427 01/25/15 0915 01/26/15 0913  PHART 7.44 7.42 7.46*  PCO2ART 24* 25* 27*  PO2ART 137* 124* 136*    Recent Labs Lab 01/24/15 0521  AST 51*  ALT 50  ALKPHOS 100  BILITOT 0.9  ALBUMIN 2.0*    Cardiac Enzymes No results for input(s): TROPONINI in the last 168 hours.  RADIOLOGY:  Dg Chest 1 View  01/28/2015  CLINICAL DATA:  Shortness of breath . EXAM: CHEST 1 VIEW  COMPARISON:  01/27/2015 P FINDINGS: Endotracheal tube tip 1.3 cm above the carina. Proximal repositioning of 2 cm suggested. NG tube and left PICC line stable position. Mediastinum and hilar structures are unremarkable. Heart size normal. Low lung volumes with bibasilar subsegmental atelectasis. Left upper lobe mild subsegmental atelectasis and or infiltrate cannot be excluded. Small left pleural effusion. No pneumothorax. IMPRESSION: 1. Endotracheal tube tip 1.3 cm above the carina. Proximal repositioning of 2 cm suggested. 2. NG tube and left PICC line stable position. 3. Low lung volumes with mild bibasilar atelectasis. Mild left upper lobe subsegmental atelectasis and or infiltrate cannot be excluded. Electronically Signed   By: Maisie Fus  Register   On: 01/28/2015 07:40   Dg Abd 1 View  01/27/2015  CLINICAL DATA:  Status post orogastric tube placement. EXAM: ABDOMEN - 1 VIEW COMPARISON:  01/22/2015. FINDINGS: Normal bowel gas pattern. Orogastric tube tip in the distal stomach. Endotracheal tube tip in satisfactory position. Breathing motion blurring at the left lung base. Grossly clear lungs. Normal sized heart. IMPRESSION: Orogastric tube tip in the distal stomach.  No acute abnormality. Electronically Signed   By: Beckie Salts M.D.   On: 01/27/2015 14:18   Ct Head Wo Contrast  01/26/2015  CLINICAL DATA:  82 year old female status post cardiac arrest 4 days ago. Now all febrile. Unresponsive. Initial encounter. EXAM: CT HEAD WITHOUT CONTRAST TECHNIQUE: Contiguous axial images were obtained from the base of the skull through the vertex without intravenous contrast. COMPARISON:  01/10/2015, 02/11/2014. FINDINGS: Mild bubbly opacity in both sphenoid sinuses. Other Visualized paranasal sinuses and mastoids are clear. No acute osseous abnormality identified. No acute orbit or scalp soft tissue findings. Calcified atherosclerosis at the skull base. Stable cerebral volume with no intracranial mass effect  identified. Stable ventricle size and configuration. No ventriculomegaly. Basilar cisterns remain patent. No acute intracranial hemorrhage identified. There is a small lacunar infarct of the left caudate nucleus evident which is new. There is also subtle new hypodensity in the left parietal lobe (series 6, image 12). Elsewhere gray-white matter differentiation appears stable. IMPRESSION: 1. Evidence of small evolving infarcts in the left caudate nucleus and parietal lobe (left MCA territory). No associated hemorrhage or mass effect. 2. No evidence of diffuse cerebral edema at this time. Electronically Signed   By: Odessa Fleming M.D.   On: 01/26/2015 14:52   Dg Chest Port 1 View  01/27/2015  CLINICAL DATA:  Hypoxia EXAM: PORTABLE CHEST 1 VIEW COMPARISON:  January 25, 2015 FINDINGS: Endotracheal tube tip is 2.7 cm above the carina. Central catheter tip is in the superior vena cava. Nasogastric tube tip and side port are in the stomach. No pneumothorax. There is no edema or consolidation. Heart size and pulmonary vascularity are normal. No adenopathy. There is atherosclerotic calcification in the aorta. No bone lesions are apparent. IMPRESSION: Tube and catheter positions as described without pneumothorax. No edema or consolidation. Electronically Signed   By: Bretta Bang III M.D.   On: 01/27/2015 14:11       --Wells Guiles, MD.  Pager [phone] [facility]   Santiago Glad, M.D.  Stephanie Acre, M.D.  Billy Fischer, M.D  Critical Care Attestation.  I have personally obtained a history, examined the patient, evaluated laboratory and imaging results, formulated the assessment and plan and placed orders. The Patient requires high complexity decision making for assessment and support, frequent evaluation and titration of therapies, application of advanced monitoring technologies and extensive interpretation of multiple databases. The patient has critical illness that could  lead imminently to failure of 1 or more organ systems and requires the highest level of physician preparedness to intervene.  Critical Care Time devoted to patient care services described in this note is 35 minutes and is exclusive of time spent in procedures.

## 2015-01-28 NOTE — Progress Notes (Signed)
elink RN, Romeo Konitzer was notified of patients low hgl.  Will inform MD, at this time no new orders.

## 2015-01-29 ENCOUNTER — Inpatient Hospital Stay: Payer: Medicare Other

## 2015-01-29 DIAGNOSIS — J9600 Acute respiratory failure, unspecified whether with hypoxia or hypercapnia: Secondary | ICD-10-CM

## 2015-01-29 DIAGNOSIS — F0390 Unspecified dementia without behavioral disturbance: Secondary | ICD-10-CM

## 2015-01-29 LAB — GLUCOSE, CAPILLARY
GLUCOSE-CAPILLARY: 143 mg/dL — AB (ref 65–99)
GLUCOSE-CAPILLARY: 160 mg/dL — AB (ref 65–99)
Glucose-Capillary: 136 mg/dL — ABNORMAL HIGH (ref 65–99)
Glucose-Capillary: 141 mg/dL — ABNORMAL HIGH (ref 65–99)
Glucose-Capillary: 158 mg/dL — ABNORMAL HIGH (ref 65–99)

## 2015-01-29 LAB — BLOOD GAS, ARTERIAL
ACID-BASE DEFICIT: 2.1 mmol/L — AB (ref 0.0–2.0)
Bicarbonate: 20.6 mEq/L — ABNORMAL LOW (ref 21.0–28.0)
FIO2: 0.3
LHR: 10 {breaths}/min
MECHVT: 450 mL
O2 SAT: 98.8 %
PATIENT TEMPERATURE: 37
PEEP/CPAP: 5 cmH2O
pCO2 arterial: 29 mmHg — ABNORMAL LOW (ref 32.0–48.0)
pH, Arterial: 7.46 — ABNORMAL HIGH (ref 7.350–7.450)
pO2, Arterial: 118 mmHg — ABNORMAL HIGH (ref 83.0–108.0)

## 2015-01-29 LAB — BASIC METABOLIC PANEL
Anion gap: 4 — ABNORMAL LOW (ref 5–15)
BUN: 23 mg/dL — AB (ref 6–20)
CHLORIDE: 110 mmol/L (ref 101–111)
CO2: 22 mmol/L (ref 22–32)
CREATININE: 0.61 mg/dL (ref 0.44–1.00)
Calcium: 8.4 mg/dL — ABNORMAL LOW (ref 8.9–10.3)
GFR calc non Af Amer: >60 mL/min (ref >60)
GLUCOSE: 166 mg/dL — AB (ref 65–99)
Potassium: 4.2 mmol/L (ref 3.5–5.1)
Sodium: 136 mmol/L (ref 135–145)

## 2015-01-29 LAB — CBC
HEMATOCRIT: 22.6 % — AB (ref 35.0–47.0)
HEMOGLOBIN: 7.2 g/dL — AB (ref 12.0–16.0)
MCH: 26.5 pg (ref 26.0–34.0)
MCHC: 31.8 g/dL — ABNORMAL LOW (ref 32.0–36.0)
MCV: 83.3 fL (ref 80.0–100.0)
PLATELETS: 373 10*3/uL (ref 150–440)
RBC: 2.71 MIL/uL — AB (ref 3.80–5.20)
RDW: 17.2 % — ABNORMAL HIGH (ref 11.5–14.5)
WBC: 12.3 10*3/uL — AB (ref 3.6–11.0)

## 2015-01-29 LAB — PHOSPHORUS: Phosphorus: 2.3 mg/dL — ABNORMAL LOW (ref 2.5–4.6)

## 2015-01-29 LAB — MAGNESIUM: MAGNESIUM: 2.1 mg/dL (ref 1.7–2.4)

## 2015-01-29 MED ORDER — POTASSIUM & SODIUM PHOSPHATES 280-160-250 MG PO PACK: 1.0000 | PACK | Freq: Two times a day (BID) | ORAL | Status: DC

## 2015-01-29 MED ORDER — FAMOTIDINE 40 MG/5ML PO SUSR
20.0000 mg | Freq: Every day | ORAL | Status: DC
  Administered 2015-01-29 – 2015-02-01 (×4): 20 mg
  Filled 2015-01-29 (×6): qty 2.5

## 2015-01-29 MED ORDER — FREE WATER
100.0000 mL | Freq: Three times a day (TID) | Status: DC
  Administered 2015-01-29 – 2015-02-02 (×13): 100 mL

## 2015-01-29 MED ORDER — POTASSIUM & SODIUM PHOSPHATES 280-160-250 MG PO PACK
1.0000 | PACK | Freq: Two times a day (BID) | ORAL | Status: AC
  Administered 2015-01-29 (×2): 1
  Filled 2015-01-29 (×2): qty 1

## 2015-01-29 NOTE — Progress Notes (Signed)
Pharmacy Consult for Electrolyte Monitoring  Allergies  Allergen Reactions  . Codeine Sulfate Other (See Comments)    unknown  . Ivp Dye [Iodinated Diagnostic Agents] Other (See Comments)    unknown  . Lansoprazole Other (See Comments)    unknown  . Losartan Potassium-Hctz Other (See Comments)    unknown  . Penicillins Other (See Comments)    unknown  . Pravachol [Pravastatin] Other (See Comments)    unknown  . Prednisone Other (See Comments)    unknown  . Sulfa Antibiotics Other (See Comments)    unknown  . Sulfonamide Derivatives Other (See Comments)    unknown    Patient Measurements: Height:  (142.2 cm) Weight: 103 lb 13.4 oz (47.1 kg) IBW/kg (Calculated) : 36.3  Vital Signs: Temp: 99.5 F (37.5 C) (01/21 0500) Temp Source: Rectal (01/20 1900) BP: 146/52 mmHg (01/21 0500) Pulse Rate: 79 (01/21 0500) Intake/Output from previous day: 01/20 0701 - 01/21 0700 In: 1835 [I.V.:825; NG/GT:1010] Out: 1350 [Urine:1350] Intake/Output from this shift: Total I/O In: 270 [NG/GT:270] Out: 150 [Urine:150]  Labs:  Recent Labs  01/28/15 0300 01/29/15 0405  WBC 10.7 12.3*  HGB 7.5* 7.2*  HCT 23.8* 22.6*  PLT 346 373     Recent Labs  01/27/15 0405 01/28/15 0300 01/29/15 0405  NA 136 133* 136  K 4.5 4.0 4.2  CL 113* 109 110  CO2 16* 20* 22  GLUCOSE 177* 168* 166*  BUN 20 20 23*  CREATININE 0.60 0.63 0.61  CALCIUM 8.2* 8.2* 8.4*  MG 1.7 1.5* 2.1  PHOS 2.8 2.6 2.3*   Estimated Creatinine Clearance: 28.8 mL/min (by C-G formula based on Cr of 0.61).    Recent Labs  01/28/15 1929 01/28/15 2340 01/29/15 0329  GLUCAP 153* 152* 136*    Medical History: Past Medical History  Diagnosis Date  . Edema leg   . Constipation   . LBBB (left bundle branch block)   . Allergic rhinitis   . Anxiety   . Alzheimer's dementia     Medications:  Scheduled:  . antiseptic oral rinse  7 mL Mouth Rinse QID  . aspirin  81 mg Oral Daily  . chlorhexidine  gluconate  15 mL Mouth Rinse BID  . enoxaparin (LOVENOX) injection  30 mg Subcutaneous Q24H  . free water  200 mL Per Tube 3 times per day  . insulin aspart  0-9 Units Subcutaneous 6 times per day  . megestrol  400 mg Oral BID  . midazolam  4 mg Intravenous Once  . pantoprazole (PROTONIX) IV  40 mg Intravenous Q24H  . potassium & sodium phosphates  1 packet Per Tube BID WC  . sodium chloride  3 mL Intravenous Q12H   Infusions:  . sodium chloride 75 mL/hr at 01/28/15 1830  . feeding supplement (VITAL AF 1.2 CAL) 1,000 mL (01/28/15 1537)  . norepinephrine (LEVOPHED) Adult infusion Stopped (01/23/15 1315)    Assessment: Pharmacy consulted to assist in managing electrolytes in this 82 y/o F admitted with UTI and respiratory failure due to code blue, PEA.   Plan:  K 4.2, Phos 2.3, Mg 2.1. Ordered potassium phosphate/sodium phosphate oral per tube x 2 doses. Will reassess tomorrow with AM labs.  Carola Frost, Pharm.D., BCPS Clinical Pharmacist 01/29/2015,5:41 AM

## 2015-01-29 NOTE — Progress Notes (Signed)
[facility] - [place] at [facility]   PATIENT NAME: Christine Vance    MR#:  [id number]  DATE OF BIRTH:  Jan 06, 1921  SUBJECTIVE:  CHIEF COMPLAINT:   Chief Complaint  Patient presents with  . Urinary Tract Infection    unresponsive   Sent from NH- found with UTI, malnourished, and dehydrated.  Patient coded ,PEA , arrest time 8-10 min  and was moved to intensive care unit and got intubated 1/14.Not on any sedation.  1/19 patient was extubated, but could not tolerate got reintubated. No family members at bedside. Remains on vent support.   REVIEW OF SYSTEMS:   Limited review of systems Review of Systems  Unable to perform ROS   DRUG ALLERGIES:   Allergies  Allergen Reactions  . Codeine Sulfate Other (See Comments)    unknown  . Ivp Dye [Iodinated Diagnostic Agents] Other (See Comments)    unknown  . Lansoprazole Other (See Comments)    unknown  . Losartan Potassium-Hctz Other (See Comments)    unknown  . Penicillins Other (See Comments)    unknown  . Pravachol [Pravastatin] Other (See Comments)    unknown  . Prednisone Other (See Comments)    unknown  . Sulfa Antibiotics Other (See Comments)    unknown  . Sulfonamide Derivatives Other (See Comments)    unknown    VITALS:  Blood pressure 113/54, pulse 98, temperature 100.8 F (38.2 C), temperature source Rectal, resp. rate 18, height  (1.422 m), weight 47.1 kg (103 lb 13.4 oz), SpO2 100 %.  PHYSICAL EXAMINATION:   GENERAL: 82 y.o.-year-old patient lying in the bed with no acute distress.  EYES: Pupils equal, round, reactive to light sluggishly. No scleral icterus.   HEENT: Head atraumatic, normocephalic. ET tube intact . Positive gag reflex per RN report NECK: Supple, no jugular venous distention. No thyroid enlargement, no tenderness.  LUNGS: Diminished  breath sounds bilaterally, no wheezing, rales, rhonchi. No use of accessory muscles of respiration.  CARDIOVASCULAR: S1,  S2 normal. No murmurs, rubs, or gallops.  ABDOMEN: Soft, nontender, nondistended. Bowel sounds present. No organomegaly or mass.  EXTREMITIES: peripheral  edema, no cyanosis, or clubbing. + 2 pedal & radial pulses b/l. Diffuse muscle wasting  NEUROLOGIC: Intubated PSYCHIATRIC: The patient is intubated, not on  Sedatives PICC line in place  Physical Exam LABORATORY PANEL:   CBC  Recent Labs Lab 01/29/15 0405  WBC 12.3*  HGB 7.2*  HCT 22.6*  PLT 373   ------------------------------------------------------------------------------------------------------------------  Chemistries   Recent Labs Lab 01/24/15 0521  01/29/15 0405  NA 142  < > 136  K 3.7  < > 4.2  CL 119*  < > 110  CO2 17*  < > 22  GLUCOSE 200*  < > 166*  BUN 19  < > 23*  CREATININE 0.86  < > 0.61  CALCIUM 8.0*  < > 8.4*  MG 1.6*  < > 2.1  AST 51*  --   --   ALT 50  --   --   ALKPHOS 100  --   --   BILITOT 0.9  --   --   < > = values in this interval not displayed. ------------------------------------------------------------------------------------------------------------------  Cardiac Enzymes No results for input(s): TROPONINI in the last 168 hours. ------------------------------------------------------------------------------------------------------------------  RADIOLOGY:  Dg Chest 1 View  01/29/2015  CLINICAL DATA:  CCU patient, dyspnea. EXAM: CHEST 1 VIEW COMPARISON:  Chest x-ray dated 01/28/2015. FINDINGS: Heart size is normal. Overall cardiomediastinal silhouette is  stable in size and configuration. Endotracheal tube well positioned with tip approximately 2.5 cm above the carina. Left-sided PICC line well positioned with tip overlying the expected area of the cavoatrial junction. Lungs appear clear, perhaps mild atelectasis at the left lung base. No pleural effusions seen. No pneumothorax seen. IMPRESSION: No active disease. Electronically Signed   By: Bary Richard M.D.   On: 01/29/2015 07:46    Dg Chest 1 View  01/28/2015  CLINICAL DATA:  Shortness of breath . EXAM: CHEST 1 VIEW COMPARISON:  01/27/2015 P FINDINGS: Endotracheal tube tip 1.3 cm above the carina. Proximal repositioning of 2 cm suggested. NG tube and left PICC line stable position. Mediastinum and hilar structures are unremarkable. Heart size normal. Low lung volumes with bibasilar subsegmental atelectasis. Left upper lobe mild subsegmental atelectasis and or infiltrate cannot be excluded. Small left pleural effusion. No pneumothorax. IMPRESSION: 1. Endotracheal tube tip 1.3 cm above the carina. Proximal repositioning of 2 cm suggested. 2. NG tube and left PICC line stable position. 3. Low lung volumes with mild bibasilar atelectasis. Mild left upper lobe subsegmental atelectasis and or infiltrate cannot be excluded. Electronically Signed   By: Maisie Fus  Register   On: 01/28/2015 07:40    ASSESSMENT AND PLAN:   Active Problems:   UTI (lower urinary tract infection)   Protein-calorie malnutrition, severe   Dyspnea  *Acute hypoxic respiratory failure secondary to aspiration PNA On vancomycin and invanz for broad abx. Vancomycin discontinued and continue Invanz   * Cardiac arrest -PEA Intubated Extubated 1/19 by intensivist but got reintubated as patient could not tolerate FULL CODE  * Acute encephalopathy secondary to sepsis secondary to possible aspiration pneumonia, superimposed on acute cystitis from ESBL/failure to thrive with poor by mouth intake Finished Ertapenem, appreciated ID consult. There may be some brain injury deu to cardiac arrest, as she is not requiring any sedation, and not waking up. POA to decide further plan.  * ESBL UTI   Given ertapenem.  * Hyponatremia with severe dehydration-resolved with IV fluids  * Severe malnutrition On TFs  * ARF secondary to dehydration from poor by mouth intake Resolved with IV fluids  poor prognosis  Disposition-social worker called her daughter-in-law to  have a family meeting before tomorrow afternoon. If daughter-in-law insists the patient to be full code, patient probably needs tracheostomy and PEG tube and LTAC  CODE STATUS: DNR  TOTAL CRITICAL CARE TIME TAKING CARE OF THIS PATIENT: .    Altamese Dilling M.D on 01/29/2015   Between 7am to 6pm - Pager - [phone].  After 6pm go to [email or web address] - password [email or web address] [facility]  [place] [facility]  Office  [phone]  CC: Primary care physician; Keane Police, MD  Note: This dictation was prepared with Dragon dictation along with smaller phrase technology. Any transcriptional errors that result from this process are unintentional.

## 2015-01-29 NOTE — Progress Notes (Signed)
Pt remains on vent.  Pt will open eyes to pain but will not track.  Pt has no purposeful movement noted.  Foley remains in place with 1L out, clear yellow urine.  Tube feeds remain infusing.  Daughter in law, [place], visited during the night.  Stated she will be in this morning around noon to discuss pt. With MD.

## 2015-01-29 NOTE — Progress Notes (Signed)
PCCM F/U NOTE  SUBJ: RASS -4. Not F/C. Remains intubated on full vent support  OBJ: Filed Vitals:   01/29/15 1100 01/29/15 1200 01/29/15 1300 01/29/15 1400  BP: 139/54 141/54 132/46 123/47  Pulse: 80 82 87 87  Temp: 99.9 F (37.7 C) 100 F (37.8 C) 100.2 F (37.9 C) 100.2 F (37.9 C)  TempSrc:      Resp: Height:      Weight:      SpO2: 99% 100% 100% 100%   Frail,, intubated, RASS -4 HEENT: PERRL + JVD Diminished BS, no wheezes Reg, no M Abd soft, +BS Symmetric LE edema  BMP Latest Ref Rng 01/29/2015 01/28/2015 01/27/2015  Glucose 65 - 99 mg/dL 308(M) 578(I) 696(E)  BUN 6 - 20 mg/dL 95(M) 20 20  Creatinine 0.44 - 1.00 mg/dL 8.41 3.24 4.01  Sodium 135 - 145 mmol/L 136 133(L) 136  Potassium 3.5 - 5.1 mmol/L 4.2 4.0 4.5  Chloride 101 - 111 mmol/L 110 109 113(H)  CO2 22 - 32 mmol/L 22 20(L) 16(L)  Calcium 8.9 - 10.3 mg/dL 0.2(V) 2.5(D) 6.6(Y)    CBC Latest Ref Rng 01/29/2015 01/28/2015 01/26/2015  WBC 3.6 - 11.0 K/uL 12.3(H) 10.7 11.1(H)  Hemoglobin 12.0 - 16.0 g/dL 7.2(L) 7.5(L) 8.1(L)  Hematocrit 35.0 - 47.0 % 22.6(L) 23.8(L) 25.5(L)  Platelets 150 - 440 K/uL 373 346 288    CXR: NACPD  IMPRESSION: Admitted with ESBL UTI PEA arrest 01/22/15 Hypotension, resolved VDRF since arrest, failed extubation 01/27/15 Advanced Alzheimer's dementia Post anoxic encephalopathy L MCA territory CVA by CT head 01/26/15  PLAN/REC: Cont full vent support - settings reviewed and/or adjusted Cont vent bundle Daily SBT if/when meets criteria Monitor BMET intermittently Monitor I/Os Correct electrolytes as indicated  I discussed goals of care in depth with the pt's ex-daughter in law, HCPOA and with her granddaughter. She now understands that the prognosis for a functional or meaningful recovery is nil. To proceed with trach and G tube would simply commit her to prolonged care similar to her current state in an [facility] or SNF setting. She agrees with DNR and intends to  proceed with extubation in the first of the week with no re-intubation. At this point, there is no plan for trach tube or G tube placement  CCM time: 45 mins The above time includes time spent in consultation with patient and/or family members and reviewing care plan on multidisciplinary rounds  Billy Fischer, MD PCCM service Mobile [phone] Pager [phone]

## 2015-01-30 DIAGNOSIS — L8990 Pressure ulcer of unspecified site, unspecified stage: Secondary | ICD-10-CM | POA: Insufficient documentation

## 2015-01-30 LAB — BASIC METABOLIC PANEL
Anion gap: 5 (ref 5–15)
BUN: 26 mg/dL — AB (ref 6–20)
CHLORIDE: 107 mmol/L (ref 101–111)
CO2: 24 mmol/L (ref 22–32)
Calcium: 8.6 mg/dL — ABNORMAL LOW (ref 8.9–10.3)
Creatinine, Ser: 0.6 mg/dL (ref 0.44–1.00)
Glucose, Bld: 172 mg/dL — ABNORMAL HIGH (ref 65–99)
POTASSIUM: 4.4 mmol/L (ref 3.5–5.1)
SODIUM: 136 mmol/L (ref 135–145)

## 2015-01-30 LAB — GLUCOSE, CAPILLARY
GLUCOSE-CAPILLARY: 138 mg/dL — AB (ref 65–99)
GLUCOSE-CAPILLARY: 143 mg/dL — AB (ref 65–99)
GLUCOSE-CAPILLARY: 155 mg/dL — AB (ref 65–99)
Glucose-Capillary: 149 mg/dL — ABNORMAL HIGH (ref 65–99)
Glucose-Capillary: 131 mg/dL — ABNORMAL HIGH (ref 65–99)
Glucose-Capillary: 151 mg/dL — ABNORMAL HIGH (ref 65–99)
Glucose-Capillary: 149 mg/dL — ABNORMAL HIGH (ref 65–99)

## 2015-01-30 LAB — PHOSPHORUS: PHOSPHORUS: 3.1 mg/dL (ref 2.5–4.6)

## 2015-01-30 LAB — MAGNESIUM: MAGNESIUM: 1.9 mg/dL (ref 1.7–2.4)

## 2015-01-30 MED ORDER — MORPHINE SULFATE (PF) 2 MG/ML IV SOLN: 1.0000 mg | INTRAVENOUS | Status: DC | PRN

## 2015-01-30 MED ORDER — MORPHINE SULFATE (PF) 2 MG/ML IV SOLN
INTRAVENOUS | Status: AC
  Administered 2015-01-30: 2 mg via INTRAVENOUS
  Filled 2015-01-30: qty 1

## 2015-01-30 NOTE — Progress Notes (Signed)
[facility] - [place] at [facility]   PATIENT NAME: Christine Vance    MR#:  [id number]  DATE OF BIRTH:  12-May-1920  SUBJECTIVE:  CHIEF COMPLAINT:   Chief Complaint  Patient presents with  . Urinary Tract Infection    unresponsive   Sent from NH- found with UTI, malnourished, and dehydrated.  Patient coded ,PEA , arrest time 8-10 min  and was moved to intensive care unit and got intubated 1/14.Not on any sedation.  1/19 patient was extubated, but could not tolerate got reintubated. No family members at bedside. Remains on vent support.  POA agreed on extubation and no reintubation on monday or Tuesday.   Pt is not on any sedation, have some movements but does not follow commands.   REVIEW OF SYSTEMS:   Limited review of systems Review of Systems  Unable to perform ROS   DRUG ALLERGIES:   Allergies  Allergen Reactions  . Codeine Sulfate Other (See Comments)    unknown  . Ivp Dye [Iodinated Diagnostic Agents] Other (See Comments)    unknown  . Lansoprazole Other (See Comments)    unknown  . Losartan Potassium-Hctz Other (See Comments)    unknown  . Penicillins Other (See Comments)    unknown  . Pravachol [Pravastatin] Other (See Comments)    unknown  . Prednisone Other (See Comments)    unknown  . Sulfa Antibiotics Other (See Comments)    unknown  . Sulfonamide Derivatives Other (See Comments)    unknown    VITALS:  Blood pressure 141/64, pulse 102, temperature 100 F (37.8 C), temperature source Rectal, resp. rate 16, height  (1.422 m), weight 47.6 kg (104 lb 15 oz), SpO2 97 %.  PHYSICAL EXAMINATION:   GENERAL: 82 y.o.-year-old patient lying in the bed with no acute distress.  EYES: Pupils equal, round, reactive to light sluggishly. No scleral icterus.   HEENT: Head atraumatic, normocephalic. ET tube intact . Positive gag reflex per RN report NECK: Supple, no jugular venous distention. No thyroid enlargement, no tenderness.   LUNGS: Diminished  breath sounds bilaterally, no wheezing, rales, rhonchi. No use of accessory muscles of respiration.  CARDIOVASCULAR: S1, S2 normal. No murmurs, rubs, or gallops.  ABDOMEN: Soft, nontender, nondistended. Bowel sounds present. No organomegaly or mass.  EXTREMITIES: peripheral  edema, no cyanosis, or clubbing. + 2 pedal & radial pulses b/l. Diffuse muscle wasting  NEUROLOGIC: Intubated, no sedation, moves limbs some, but not following commands. PSYCHIATRIC: The patient is intubated, not on  Sedatives PICC line in place  Physical Exam LABORATORY PANEL:   CBC  Recent Labs Lab 01/29/15 0405  WBC 12.3*  HGB 7.2*  HCT 22.6*  PLT 373   ------------------------------------------------------------------------------------------------------------------  Chemistries   Recent Labs Lab 01/24/15 0521  01/30/15 0414  NA 142  < > 136  K 3.7  < > 4.4  CL 119*  < > 107  CO2 17*  < > 24  GLUCOSE 200*  < > 172*  BUN 19  < > 26*  CREATININE 0.86  < > 0.60  CALCIUM 8.0*  < > 8.6*  MG 1.6*  < > 1.9  AST 51*  --   --   ALT 50  --   --   ALKPHOS 100  --   --   BILITOT 0.9  --   --   < > = values in this interval not displayed. ------------------------------------------------------------------------------------------------------------------  Cardiac Enzymes No results for input(s): TROPONINI in the last 168  hours. ------------------------------------------------------------------------------------------------------------------  RADIOLOGY:  Dg Chest 1 View  01/29/2015  CLINICAL DATA:  CCU patient, dyspnea. EXAM: CHEST 1 VIEW COMPARISON:  Chest x-ray dated 01/28/2015. FINDINGS: Heart size is normal. Overall cardiomediastinal silhouette is stable in size and configuration. Endotracheal tube well positioned with tip approximately 2.5 cm above the carina. Left-sided PICC line well positioned with tip overlying the expected area of the cavoatrial junction. Lungs appear  clear, perhaps mild atelectasis at the left lung base. No pleural effusions seen. No pneumothorax seen. IMPRESSION: No active disease. Electronically Signed   By: Bary Richard M.D.   On: 01/29/2015 07:46    ASSESSMENT AND PLAN:   Active Problems:   UTI (lower urinary tract infection)   Protein-calorie malnutrition, severe   Dyspnea   Pressure ulcer  *Acute hypoxic respiratory failure secondary to aspiration PNA On vancomycin and invanz for broad abx. Vancomycin discontinued and   finished invanze course.  currently not on any ABx.  * Cardiac arrest -PEA Intubated Extubated 1/19 by intensivist but got reintubated as patient could not tolerate FULL CODE  * Acute encephalopathy secondary to sepsis secondary to possible aspiration pneumonia, superimposed on acute cystitis from ESBL/failure to thrive with poor by mouth intake Finished Ertapenem, appreciated ID consult. There may be some brain injury due to cardiac arrest, as she is not requiring any sedation, and not waking up. POA decided extubation plan on Monday or Tuesday.  * ESBL UTI   Given ertapenem.  * Hyponatremia with severe dehydration-resolved with IV fluids  * Severe malnutrition On TFs  * ARF secondary to dehydration from poor by mouth intake Resolved with IV fluids  poor prognosis  CODE STATUS: DNR  TOTAL CRITICAL CARE TIME TAKING CARE OF THIS PATIENT: .    Altamese Dilling M.D on 01/30/2015   Between 7am to 6pm - Pager - [phone].  After 6pm go to [email or web address] - password [email or web address] [facility]  [place] [facility]  Office  [phone]  CC: Primary care physician; Keane Police, MD  Note: This dictation was prepared with Dragon dictation along with smaller phrase technology. Any transcriptional errors that result from this process are unintentional.

## 2015-01-30 NOTE — Progress Notes (Addendum)
PCCM F/U NOTE  SUBJ: RASS -4. Not F/C. Remains intubated on full vent support. Occasional spontaneous movement, nonpurposeful and not to command  OBJ: Filed Vitals:   01/30/15 0600 01/30/15 0700 01/30/15 0723 01/30/15 0800  BP: 122/54 122/50  118/55  Pulse: 84 79  79  Temp: 99.9 F (37.7 C) 99.9 F (37.7 C)  99.9 F (37.7 C)  TempSrc:    Rectal  Resp: Height:      Weight:      SpO2: 89% 85% 96% 100%   Frail,, intubated, RASS -4 HEENT: PERRL + JVD Diminished BS, no wheezes Reg, no M Abd soft, +BS Symmetric LE edema  BMP Latest Ref Rng 01/30/2015 01/29/2015 01/28/2015  Glucose 65 - 99 mg/dL 782(N) 562(Z) 308(M)  BUN 6 - 20 mg/dL 57(Q) 46(N) 20  Creatinine 0.44 - 1.00 mg/dL 6.29 5.28 4.13  Sodium 135 - 145 mmol/L 136 136 133(L)  Potassium 3.5 - 5.1 mmol/L 4.4 4.2 4.0  Chloride 101 - 111 mmol/L 107 110 109  CO2 22 - 32 mmol/L 24 22 20(L)  Calcium 8.9 - 10.3 mg/dL 2.4(M) 0.1(U) 2.7(O)    CBC Latest Ref Rng 01/29/2015 01/28/2015 01/26/2015  WBC 3.6 - 11.0 K/uL 12.3(H) 10.7 11.1(H)  Hemoglobin 12.0 - 16.0 g/dL 7.2(L) 7.5(L) 8.1(L)  Hematocrit 35.0 - 47.0 % 22.6(L) 23.8(L) 25.5(L)  Platelets 150 - 440 K/uL 373 346 288    CXR: No new film  IMPRESSION: Admitted with ESBL UTI PEA arrest 01/22/15 Hypotension, resolved VDRF since arrest, failed extubation 01/27/15 Advanced Alzheimer's dementia Post anoxic encephalopathy L MCA territory CVA by CT head 01/26/15  PLAN/REC: Cont full vent support - settings reviewed and/or adjusted Cont vent bundle Daily SBT if/when meets criteria Monitor BMET intermittently Monitor I/Os Correct electrolytes as indicated  On 01/21, I discussed goals of care in depth with the pt's ex-daughter in law who is HCPOA and with her granddaughter. She expressed understanding that the prognosis for a functional or meaningful recovery is nil. To proceed with trach and G tube would simply commit her to prolonged care similar to her current  state in an [facility] or SNF setting. She agrees with DNR and intends to proceed with extubation in the first of the week with no re-intubation. At this point, there is no plan for trach tube or G tube placement  I again spoke with [facility] 01/22 who indicates that her plan is to proceed with ventilator discontinuation either Mon or Tues of next week. We will minimize interventions that are not directed @ pt's comfort   Billy Fischer, MD PCCM service Mobile [phone] Pager [phone]

## 2015-01-30 NOTE — Progress Notes (Signed)
Pharmacy Consult for Electrolyte Monitoring  Allergies  Allergen Reactions  . Codeine Sulfate Other (See Comments)    unknown  . Ivp Dye [Iodinated Diagnostic Agents] Other (See Comments)    unknown  . Lansoprazole Other (See Comments)    unknown  . Losartan Potassium-Hctz Other (See Comments)    unknown  . Penicillins Other (See Comments)    unknown  . Pravachol [Pravastatin] Other (See Comments)    unknown  . Prednisone Other (See Comments)    unknown  . Sulfa Antibiotics Other (See Comments)    unknown  . Sulfonamide Derivatives Other (See Comments)    unknown    Patient Measurements: Height:  (142.2 cm) Weight: 103 lb 13.4 oz (47.1 kg) IBW/kg (Calculated) : 36.3  Vital Signs: Temp: 99.9 F (37.7 C) (01/22 0300) Temp Source: Rectal (01/21 2000) BP: 124/63 mmHg (01/22 0300) Pulse Rate: 86 (01/22 0300) Intake/Output from previous day: 01/21 0701 - 01/22 0700 In: 1470 [I.V.:150; NG/GT:1320] Out: 1775 [Urine:1775] Intake/Output from this shift: Total I/O In: 460 [NG/GT:460] Out: 600 [Urine:600]  Labs:  Recent Labs  01/28/15 0300 01/29/15 0405  WBC 10.7 12.3*  HGB 7.5* 7.2*  HCT 23.8* 22.6*  PLT 346 373     Recent Labs  01/28/15 0300 01/29/15 0405 01/30/15 0414  NA 133* 136 136  K 4.0 4.2 4.4  CL 109 110 107  CO2 20* 22 24  GLUCOSE 168* 166* 172*  BUN 20 23* 26*  CREATININE 0.63 0.61 0.60  CALCIUM 8.2* 8.4* 8.6*  MG 1.5* 2.1 1.9  PHOS 2.6 2.3* 3.1   Estimated Creatinine Clearance: 28.8 mL/min (by C-G formula based on Cr of 0.6).    Recent Labs  01/29/15 1950 01/30/15 0002 01/30/15 0401  GLUCAP 160* 138* 149*    Medical History: Past Medical History  Diagnosis Date  . Edema leg   . Constipation   . LBBB (left bundle branch block)   . Allergic rhinitis   . Anxiety   . Alzheimer's dementia     Medications:  Scheduled:  . antiseptic oral rinse  7 mL Mouth Rinse QID  . aspirin  81 mg Oral Daily  . chlorhexidine gluconate   15 mL Mouth Rinse BID  . famotidine  20 mg Per Tube Daily  . free water  100 mL Per Tube 3 times per day  . insulin aspart  0-9 Units Subcutaneous 6 times per day  . sodium chloride  3 mL Intravenous Q12H   Infusions:  . feeding supplement (VITAL AF 1.2 CAL) 1,000 mL (01/29/15 0739)    Assessment: Pharmacy consulted to assist in managing electrolytes in this 82 y/o F admitted with UTI and respiratory failure due to code blue, PEA.   Plan:  Electrolytes within normal limits. Will recheck tomorrow with AM labs.   Carola Frost, Pharm.D., BCPS Clinical Pharmacist 01/30/2015,5:01 AM

## 2015-01-30 NOTE — Progress Notes (Signed)
eLink Physician-Brief Progress Note Patient Name: Christine Vance DOB: 1920/10/15 MRN: [id number]   Date of Service  01/30/2015  HPI/Events of Note  Biting ETT DNR  eICU Interventions  Increase moprhine 1-2 mg q 2h prn If more required, can use fent gtt     Intervention Category Major Interventions: Delirium, psychosis, severe agitation - evaluation and management  ALVA,RAKESH V. 01/30/2015, 9:19 PM

## 2015-01-30 NOTE — Progress Notes (Addendum)
Christine Pel MD to report pt more alert and biting ETT, HR tachy and very restless. Pt received 1 mg morphine to aid with sedation that was ineffective. MD placed order to increase morphine dose and frequency.

## 2015-01-30 NOTE — Care Management Note (Signed)
Case Management Note  Patient Details  Name: Christine Vance MRN: [id number] Date of Birth: 1920-09-25  Subjective/Objective:       Discharge planning: Per Dr Joie Bimler note today the current plan of care agreed upon with the family is to extubate the first of this coming week with no plan for re-intubation. DNR. After extubation will be Comfort Care.              Action/Plan:   Expected Discharge Date:                  Expected Discharge Plan:     In-House Referral:     Discharge planning Services     Post Acute Care Choice:    Choice offered to:     DME Arranged:    DME Agency:     HH Arranged:    HH Agency:     Status of Service:     Medicare Important Message Given:  Yes Date Medicare IM Given:    Medicare IM give by:    Date Additional Medicare IM Given:    Additional Medicare Important Message give by:     If discussed at Long Length of Stay Meetings, dates discussed:    Additional Comments:  Davontae Prusinski A, RN 01/30/2015, 3:42 PM

## 2015-01-31 DIAGNOSIS — I639 Cerebral infarction, unspecified: Secondary | ICD-10-CM

## 2015-01-31 DIAGNOSIS — J9600 Acute respiratory failure, unspecified whether with hypoxia or hypercapnia: Secondary | ICD-10-CM | POA: Insufficient documentation

## 2015-01-31 DIAGNOSIS — J9602 Acute respiratory failure with hypercapnia: Secondary | ICD-10-CM

## 2015-01-31 DIAGNOSIS — L8990 Pressure ulcer of unspecified site, unspecified stage: Secondary | ICD-10-CM

## 2015-01-31 DIAGNOSIS — R4182 Altered mental status, unspecified: Secondary | ICD-10-CM | POA: Insufficient documentation

## 2015-01-31 LAB — GLUCOSE, CAPILLARY
GLUCOSE-CAPILLARY: 145 mg/dL — AB (ref 65–99)
GLUCOSE-CAPILLARY: 156 mg/dL — AB (ref 65–99)
Glucose-Capillary: 156 mg/dL — ABNORMAL HIGH (ref 65–99)
Glucose-Capillary: 169 mg/dL — ABNORMAL HIGH (ref 65–99)
Glucose-Capillary: 170 mg/dL — ABNORMAL HIGH (ref 65–99)
Glucose-Capillary: 140 mg/dL — ABNORMAL HIGH (ref 65–99)

## 2015-01-31 LAB — BASIC METABOLIC PANEL
Anion gap: 5 (ref 5–15)
BUN: 31 mg/dL — AB (ref 6–20)
CHLORIDE: 104 mmol/L (ref 101–111)
CO2: 26 mmol/L (ref 22–32)
CREATININE: 0.65 mg/dL (ref 0.44–1.00)
Calcium: 8.9 mg/dL (ref 8.9–10.3)
GFR calc Af Amer: >60 mL/min (ref >60)
Glucose, Bld: 181 mg/dL — ABNORMAL HIGH (ref 65–99)
Potassium: 4.3 mmol/L (ref 3.5–5.1)
SODIUM: 135 mmol/L (ref 135–145)

## 2015-01-31 LAB — MAGNESIUM: MAGNESIUM: 1.8 mg/dL (ref 1.7–2.4)

## 2015-01-31 LAB — PHOSPHORUS: Phosphorus: 3.2 mg/dL (ref 2.5–4.6)

## 2015-01-31 NOTE — Progress Notes (Signed)
Received call from Daughter-in-law stating plans to withdrawal of care Wednesday at noon.

## 2015-01-31 NOTE — Progress Notes (Signed)
Nutrition Follow-up  DOCUMENTATION CODES:   Severe malnutrition in context of chronic illness  INTERVENTION:   EN: Continue current TF regimen at this time as ordered   NUTRITION DIAGNOSIS:   Inadequate oral intake related to inability to eat as evidenced by NPO status. Being addressed with TF.  GOAL:   Patient will meet greater than or equal to 90% of their needs; ongoing  MONITOR:    (Energy Intake, Anthropometrics, Electrolyte and rneal Profile, Digestive System, Glucose Profile)  REASON FOR ASSESSMENT:   Consult Poor PO  ASSESSMENT:   Pt remains intubated on the vent. Per MD note possible extubation in the first of the week with no re-intubation, without plan for trach or PEG placement. Neurology following; per note reports due to pt's age and multiple medical issues including infection and multiple acute infarcts, prognosis is extremely poor for the occurrence of a functional recovery.  EN: tolerating Vital 1.2 at rate of 45 ml/hr with free water flushes of TID  Gastrointestinal Profile: no signs of TF intolerance Last BM: 01/28/2015   Scheduled Medications:  . antiseptic oral rinse  7 mL Mouth Rinse QID  . aspirin  81 mg Oral Daily  . chlorhexidine gluconate  15 mL Mouth Rinse BID  . famotidine  20 mg Per Tube Daily  . free water  100 mL Per Tube 3 times per day  . insulin aspart  0-9 Units Subcutaneous 6 times per day  . sodium chloride  3 mL Intravenous Q12H    Continuous Medications:  . feeding supplement (VITAL AF 1.2 CAL) 1,000 mL (01/31/15 0700)     Electrolyte/Renal Profile and Glucose Profile:   Recent Labs Lab 01/29/15 0405 01/30/15 0414 01/31/15 0429  NA 136 136 135  K 4.2 4.4 4.3  CL 110 107 104  CO2 BUN 23* 26* 31*  CREATININE 0.61 0.60 0.65  CALCIUM 8.4* 8.6* 8.9  MG 2.1 1.9 1.8  PHOS 2.3* 3.1 3.2  GLUCOSE 166* 172* 181*   Protein Profile: No results for input(s): ALBUMIN in the last 168 hours.    Weight  Trend since Admission: Filed Weights   01/29/15 0417 01/30/15 0521 01/31/15 0600  Weight: 103 lb 13.4 oz (47.1 kg) 104 lb 15 oz (47.6 kg) 105 lb 6.1 oz (47.8 kg)    BMI:  Body mass index is 23.64 kg/(m^2).  Estimated Nutritional Needs:   Kcal:  TEE 1340 kcals/d (Ve 16, Tmax 38.3)  Protein:  (1.2-2.0 g/kg) 50-84 g/d)  Fluid:  (25-57ml/kg) 1050-1212ml/d  EDUCATION NEEDS:   No education needs identified at this time   HIGH Care Level  Leda Quail, RD, LDN Pager [phone] Weekend/On-Call Pager [phone]

## 2015-01-31 NOTE — Progress Notes (Signed)
PCCM F/U NOTE  SUBJ: RASS -4. Remains intubated on full vent support. Occasional spontaneous movement, nonpurposeful and not to command. Seen by Dr. Thad Ranger, neurology, no purposeful movements.   OBJCeasar Mons Vitals:   01/31/15 1100 01/31/15 1200 01/31/15 1300 01/31/15 1400  BP: 142/62 135/60 146/65 136/60  Pulse: 96 87 88 87  Temp: 99.7 F (37.6 C) 99.7 F (37.6 C) 99.7 F (37.6 C) 99.5 F (37.5 C)  TempSrc:      Resp: Height:      Weight:      SpO2: 99% 97% 97% 96%   Frail,, intubated, RASS -4 HEENT: PERRL + JVD Diminished BS, no wheezes Reg, no M Abd soft, +BS Symmetric LE edema  BMP Latest Ref Rng 01/31/2015 01/30/2015 01/29/2015  Glucose 65 - 99 mg/dL 629(B) 284(X) 324(M)  BUN 6 - 20 mg/dL 01(U) 27(O) 53(G)  Creatinine 0.44 - 1.00 mg/dL 6.44 0.34 7.42  Sodium 135 - 145 mmol/L 135 136 136  Potassium 3.5 - 5.1 mmol/L 4.3 4.4 4.2  Chloride 101 - 111 mmol/L 104 107 110  CO2 22 - 32 mmol/L Calcium 8.9 - 10.3 mg/dL 8.9 5.9(D) 6.3(O)    CBC Latest Ref Rng 01/29/2015 01/28/2015 01/26/2015  WBC 3.6 - 11.0 K/uL 12.3(H) 10.7 11.1(H)  Hemoglobin 12.0 - 16.0 g/dL 7.2(L) 7.5(L) 8.1(L)  Hematocrit 35.0 - 47.0 % 22.6(L) 23.8(L) 25.5(L)  Platelets 150 - 440 K/uL 373 346 288    CXR: No new film  IMPRESSION: Admitted with ESBL UTI PEA arrest 01/22/15 Hypotension, resolved VDRF since arrest, failed extubation 01/27/15 Advanced Alzheimer's dementia Post anoxic encephalopathy L MCA territory CVA by CT head 01/26/15  PLAN/REC: Cont full vent support - settings reviewed and/or adjusted Cont vent bundle Daily SBT if/when meets criteria Monitor BMET intermittently Monitor I/Os Correct electrolytes as indicated  Dr. Sung Amabile discussed goals of care in depth with the pt's ex-daughter in law who is HCPOA and with her granddaughter. She expressed understanding that the prognosis for a functional or meaningful recovery is nil. To proceed with trach and G tube  would simply commit her to prolonged care similar to her current state in an [facility] or SNF setting. She agrees with DNR and intends to proceed with extubation in the first of the week with no re-intubation. At this point, there is no plan for trach tube or G tube placement  We will minimize interventions that are not directed at pt's comfort.  Today HCPOA stated that she is awaiting on more family members, and will go through withdrawal of care on 1/25.  CCM Time - 45 mins  Stephanie Acre, MD [facility] Pager [phone] (please enter 7-digits) On Call Pager - [phone] (please enter 7-digits)

## 2015-01-31 NOTE — Progress Notes (Signed)
[facility] [place] [facility]   PATIENT NAME: Christine Vance    MR#:  [id number]  DATE OF BIRTH:  [birth year]  SUBJECTIVE:  CHIEF COMPLAINT:   Chief Complaint  Patient presents with  . Urinary Tract Infection    unresponsive   Sent from NH- found with UTI, malnourished, and dehydrated.  Patient coded ,PEA , arrest time 8-10 min  and was moved to intensive care unit and got intubated 1/14.Not on any sedation.  1/19 patient was extubated, but could not tolerate got reintubated. No family members at bedside. Remains on vent support.  POA agreed on extubation and no reintubation on Tuesday.   Pt is not on any sedation, have some movements but does not follow commands/ wake up.   REVIEW OF SYSTEMS:   Limited review of systems Review of Systems  Unable to perform ROS   DRUG ALLERGIES:   Allergies  Allergen Reactions  . Codeine Sulfate Other (See Comments)    unknown  . Ivp Dye [Iodinated Diagnostic Agents] Other (See Comments)    unknown  . Lansoprazole Other (See Comments)    unknown  . Losartan Potassium-Hctz Other (See Comments)    unknown  . Penicillins Other (See Comments)    unknown  . Pravachol [Pravastatin] Other (See Comments)    unknown  . Prednisone Other (See Comments)    unknown  . Sulfa Antibiotics Other (See Comments)    unknown  . Sulfonamide Derivatives Other (See Comments)    unknown    VITALS:  Blood pressure 137/73, pulse 93, temperature 99.3 F (37.4 C), temperature source Rectal, resp. rate 16, height  (1.422 m), weight 47.8 kg (105 lb 6.1 oz), SpO2 99 %.  PHYSICAL EXAMINATION:   GENERAL: 82 y.o.-year-old patient lying in the bed with no acute distress.  EYES: Pupils equal, round, reactive to light sluggishly. No scleral icterus.   HEENT: Head atraumatic, normocephalic. ET tube intact . Positive gag reflex per RN report NECK: Supple, no jugular venous distention. No thyroid enlargement, no tenderness.   LUNGS: Diminished  breath sounds bilaterally, no wheezing, rales, rhonchi. No use of accessory muscles of respiration.  CARDIOVASCULAR: S1, S2 normal. No murmurs, rubs, or gallops.  ABDOMEN: Soft, nontender, nondistended. Bowel sounds present. No organomegaly or mass.  EXTREMITIES: peripheral  edema, no cyanosis, or clubbing. + 2 pedal & radial pulses b/l. Diffuse muscle wasting  NEUROLOGIC: Intubated, no sedation, moves hand and feet some, but not following commands. Does not open eyes. PSYCHIATRIC: The patient is intubated, not on  Sedatives PICC line in place  Physical Exam LABORATORY PANEL:   CBC  Recent Labs Lab 01/29/15 0405  WBC 12.3*  HGB 7.2*  HCT 22.6*  PLT 373   ------------------------------------------------------------------------------------------------------------------  Chemistries   Recent Labs Lab 01/31/15 0429  NA 135  K 4.3  CL 104  CO2 26  GLUCOSE 181*  BUN 31*  CREATININE 0.65  CALCIUM 8.9  MG 1.8   ------------------------------------------------------------------------------------------------------------------  Cardiac Enzymes No results for input(s): TROPONINI in the last 168 hours. ------------------------------------------------------------------------------------------------------------------  RADIOLOGY:  No results found.  ASSESSMENT AND PLAN:   Active Problems:   UTI (lower urinary tract infection)   Protein-calorie malnutrition, severe   Dyspnea   Pressure ulcer   Acute respiratory failure (HCC)   Altered mental status  *Acute hypoxic respiratory failure secondary to aspiration PNA On vancomycin and invanz for broad abx. Vancomycin discontinued and   finished invanze course.  currently not on any ABx.  *  Cardiac arrest -PEA Intubated Extubated 1/19 by intensivist but got reintubated as patient could not tolerate DNR now, POA agreed to extubate and not re intubate tomorrow.   * Acute encephalopathy secondary to  sepsis secondary to possible aspiration pneumonia, superimposed on acute cystitis from ESBL/failure to thrive with poor by mouth intake- on admisison, and later after cardiac arrest- seems to have some anoxic brain injury. Pt is NOT comatose. Finished Ertapenem, appreciated ID consult. There may be some brain injury due to cardiac arrest, as she is not requiring any sedation, and still not waking up. POA decided extubation plan on Tuesday.  * ESBL UTI   Given ertapenem.  * Hyponatremia with severe dehydration-resolved with IV fluids  * Severe malnutrition On TFs  * ARF secondary to dehydration from poor by mouth intake Resolved with IV fluids  poor prognosis  CODE STATUS: DNR  TOTAL CRITICAL CARE TIME TAKING CARE OF THIS PATIENT: .    Altamese Dilling M.D on 01/31/2015   Between 7am to 6pm - Pager - [phone].  After 6pm go to [email or web address] - password [email or web address] [facility]  [place] [facility]  Office  [phone]  CC: Primary care physician; Keane Police, MD  Note: This dictation was prepared with Dragon dictation along with smaller phrase technology. Any transcriptional errors that result from this process are unintentional.

## 2015-01-31 NOTE — Progress Notes (Signed)
Subjective: No change noted in patient neurologically per nursing.    Objective: Current vital signs: BP 153/72 mmHg  Pulse 95  Temp(Src) 99.5 F (37.5 C) (Rectal)  Resp 16  Ht  (1.422 m)  Wt 47.8 kg (105 lb 6.1 oz)  BMI 23.64 kg/m2  SpO2 100% Vital signs in last 24 hours: Temp:  [99.1 F (37.3 C)-100 F (37.8 C)] 99.5 F (37.5 C) (01/23 0800) Pulse Rate:  [80-102] 95 (01/23 0800) Resp:  [11-20] 16 (01/23 0800) BP: (108-153)/(47-72) 153/72 mmHg (01/23 0800) SpO2:  [94 %-100 %] 100 % (01/23 0800) FiO2 (%):  [30 %] 30 % (01/23 0800) Weight:  [47.8 kg (105 lb 6.1 oz)] 47.8 kg (105 lb 6.1 oz) (01/23 0600)  Intake/Output from previous day: 01/22 0701 - 01/23 0700 In: 1440 [NG/GT:1440] Out: 2310 [Urine:2310] Intake/Output this shift: Total I/O In: 3 [I.V.:3] Out: -  Nutritional status:    Neurologic Exam: Mental Status: Patient does not respond to verbal stimuli. Grimaces to deep sternal rub and opens eyes. Does not follow commands. No verbalizations noted.  Cranial Nerves: II: patient does not respond confrontation bilaterally, pupils right 3 mm, left 3 mm,and reactive bilaterally III,IV,VI: doll's response present bilaterally. V,VII: corneal reflex present bilaterally  VIII: patient does not respond to verbal stimuli IX,X: gag reflex unable to be tested, XI: trapezius strength unable to test bilaterally XII: tongue strength unable to test Motor: Extremities flaccid throughout. No spontaneous movement or purposeful movements noted in the upper extremities. In the lower extremities patient with withdrawal to pain.  Sensory: Does not respond to noxious stimuli in the upper extremites. Withdrawal to pain in the lower extremities   Lab Results: Basic Metabolic Panel:  Recent Labs Lab 01/27/15 0405 01/28/15 0300 01/29/15 0405 01/30/15 0414 01/31/15 0429  NA 136 133* 136 136 135  K 4.5 4.0 4.2 4.4 4.3  CL 113* 109 110 107 104  CO2 16* 20* GLUCOSE 177* 168* 166* 172* 181*  BUN 20 20 23* 26* 31*  CREATININE 0.60 0.63 0.61 0.60 0.65  CALCIUM 8.2* 8.2* 8.4* 8.6* 8.9  MG 1.7 1.5* 2.1 1.9 1.8  PHOS 2.8 2.6 2.3* 3.1 3.2    Liver Function Tests: No results for input(s): AST, ALT, ALKPHOS, BILITOT, PROT, ALBUMIN in the last 168 hours. No results for input(s): LIPASE, AMYLASE in the last 168 hours. No results for input(s): AMMONIA in the last 168 hours.  CBC:  Recent Labs Lab 01/26/15 0412 01/28/15 0300 01/29/15 0405  WBC 11.1* 10.7 12.3*  HGB 8.1* 7.5* 7.2*  HCT 25.5* 23.8* 22.6*  MCV 84.1 82.4 83.3  PLT 288 346 373    Cardiac Enzymes: No results for input(s): CKTOTAL, CKMB, CKMBINDEX, TROPONINI in the last 168 hours.  Lipid Panel:  Recent Labs Lab 01/25/15 1432  TRIG 77    CBG:  Recent Labs Lab 01/30/15 2003 01/30/15 2326 01/31/15 0404 01/31/15 0727 01/31/15 1122  GLUCAP 151* 149* 156* 156* 169*    Microbiology: Results for orders placed or performed during the hospital encounter of 01/10/2015  Blood Culture (routine x 2)     Status: None   Collection Time: 01/19/2015  9:20 AM  Result Value Ref Range Status   Specimen Description BLOOD RIGHT ARM  Final   Special Requests   Final    BOTTLES DRAWN AEROBIC AND ANAEROBIC AEROBIC ANAEROBIC   Culture NO GROWTH 5 DAYS  Final   Report Status 01/22/2015 FINAL  Final  Blood Culture (routine x 2)     Status: None   Collection Time: 2015/01/26  9:31 AM  Result Value Ref Range Status   Specimen Description BLOOD LEFT SIDE  Final   Special Requests BOTTLES DRAWN AEROBIC AND ANAEROBIC  Final   Culture NO GROWTH 5 DAYS  Final   Report Status 01/22/2015 FINAL  Final  Urine culture     Status: None   Collection Time: 2015-01-26  9:31 AM  Result Value Ref Range Status   Specimen Description URINE, RANDOM  Final   Special Requests NONE  Final   Culture   Final    >=100,000 COLONIES/mL ESCHERICHIA COLI Results Called to: [facility] [place] AT 0900 ON  01/19/15 CTJ ESBL-EXTENDED SPECTRUM BETA LACTAMASE-THE ORGANISM IS RESISTANT TO PENICILLINS, CEPHALOSPORINS AND AZTREONAM ACCORDING TO CLSI M100-S15 VOL.25 N01 JAN 2005.    Report Status 01/19/2015 FINAL  Final   Organism ID, Bacteria ESCHERICHIA COLI  Final      Susceptibility   Escherichia coli - MIC*    AMPICILLIN >=32 RESISTANT Resistant     CEFTAZIDIME 4 RESISTANT Resistant     CEFAZOLIN >=64 RESISTANT Resistant     CEFTRIAXONE 32 RESISTANT Resistant     CIPROFLOXACIN >=4 RESISTANT Resistant     GENTAMICIN <=1 SENSITIVE Sensitive     IMIPENEM <=0.25 SENSITIVE Sensitive     TRIMETH/SULFA >=320 RESISTANT Resistant     Extended ESBL POSITIVE Resistant     NITROFURANTOIN Value in next row Sensitive      SENSITIVE<=16    PIP/TAZO Value in next row Sensitive      SENSITIVE<=4    AMPICILLIN/SULBACTAM Value in next row Sensitive      SENSITIVE4    * >=100,000 COLONIES/mL ESCHERICHIA COLI  MRSA PCR Screening     Status: None   Collection Time: 01/22/15  2:35 PM  Result Value Ref Range Status   MRSA by PCR NEGATIVE NEGATIVE Final    Comment:        The GeneXpert MRSA Assay (FDA approved for NASAL specimens only), is one component of a comprehensive MRSA colonization surveillance program. It is not intended to diagnose MRSA infection nor to guide or monitor treatment for MRSA infections.   C difficile quick scan w PCR reflex     Status: None   Collection Time: 01/23/15  3:59 AM  Result Value Ref Range Status   C Diff antigen NEGATIVE NEGATIVE Final   C Diff toxin NEGATIVE NEGATIVE Final   C Diff interpretation Negative for C. difficile  Final  Culture, blood (Routine X 2) w Reflex to ID Panel     Status: None   Collection Time: 01/23/15  9:50 AM  Result Value Ref Range Status   Specimen Description BLOOD RIGHT WRIST  Final   Special Requests BOTTLES DRAWN AEROBIC AND ANAEROBIC  1CC  Final   Culture NO GROWTH 5 DAYS  Final   Report Status 01/28/2015 FINAL  Final  Culture,  blood (Routine X 2) w Reflex to ID Panel     Status: None   Collection Time: 01/23/15  2:25 PM  Result Value Ref Range Status   Specimen Description BLOOD RIGHT FOREARM  Final   Special Requests BOTTLES DRAWN AEROBIC AND ANAEROBIC  1CC  Final   Culture NO GROWTH 5 DAYS  Final   Report Status 01/28/2015 FINAL  Final  Culture, expectorated sputum-assessment     Status: None   Collection Time: 01/24/15  3:44 PM  Result Value Ref Range  Status   Specimen Description TRACHEAL ASPIRATE  Final   Special Requests Normal  Final   Sputum evaluation THIS SPECIMEN IS ACCEPTABLE FOR SPUTUM CULTURE  Final   Report Status 01/25/2015 FINAL  Final  Culture, respiratory (NON-Expectorated)     Status: None   Collection Time: 01/24/15  3:44 PM  Result Value Ref Range Status   Specimen Description TRACHEAL ASPIRATE  Final   Special Requests Normal Reflexed from [id number]  Final   Gram Stain   Final    EXCELLENT SPECIMEN - 90-100% WBCS MODERATE WBC SEEN FEW YEAST    Culture Consistent with normal respiratory flora.  Final   Report Status 01/28/2015 FINAL  Final    Coagulation Studies: No results for input(s): LABPROT, INR in the last 72 hours.  Imaging: No results found.  Medications:  I have reviewed the patient's current medications. Scheduled: . antiseptic oral rinse  7 mL Mouth Rinse QID  . aspirin  81 mg Oral Daily  . chlorhexidine gluconate  15 mL Mouth Rinse BID  . famotidine  20 mg Per Tube Daily  . free water  100 mL Per Tube 3 times per day  . insulin aspart  0-9 Units Subcutaneous 6 times per day  . sodium chloride  3 mL Intravenous Q12H    Assessment/Plan: Patient with no further improvement neurologically.  Due to her age and multiple medical issues including infection and multiple acute infarcts, prognosis is extremely poor for the occurrence of a functional recovery. On ASA.    Recommendations: 1. No further neurologic intervention is recommended at this time.  If further  questions arise, please call or page at that time.  Thank you for allowing neurology to participate in the care of this patient.    LOS: 14 days   Thana Farr, MD Neurology [phone] 01/31/2015  11:49 AM

## 2015-01-31 NOTE — Progress Notes (Signed)
Pharmacy Consult for Electrolyte Monitoring  Allergies  Allergen Reactions  . Codeine Sulfate Other (See Comments)    unknown  . Ivp Dye [Iodinated Diagnostic Agents] Other (See Comments)    unknown  . Lansoprazole Other (See Comments)    unknown  . Losartan Potassium-Hctz Other (See Comments)    unknown  . Penicillins Other (See Comments)    unknown  . Pravachol [Pravastatin] Other (See Comments)    unknown  . Prednisone Other (See Comments)    unknown  . Sulfa Antibiotics Other (See Comments)    unknown  . Sulfonamide Derivatives Other (See Comments)    unknown    Patient Measurements: Height:  (142.2 cm) Weight: 104 lb 15 oz (47.6 kg) IBW/kg (Calculated) : 36.3  Vital Signs: Temp: 99.3 F (37.4 C) (01/23 0500) BP: 121/53 mmHg (01/23 0500) Pulse Rate: 80 (01/23 0500) Intake/Output from previous day: 01/22 0701 - 01/23 0700 In: 1150 [NG/GT:1150] Out: 1960 [Urine:1960] Intake/Output from this shift: Total I/O In: 450 [NG/GT:450] Out: 150 [Urine:150]  Labs:  Recent Labs  01/29/15 0405  WBC 12.3*  HGB 7.2*  HCT 22.6*  PLT 373     Recent Labs  01/29/15 0405 01/30/15 0414 01/31/15 0429  NA 136 136 135  K 4.2 4.4 4.3  CL 110 107 104  CO2 GLUCOSE 166* 172* 181*  BUN 23* 26* 31*  CREATININE 0.61 0.60 0.65  CALCIUM 8.4* 8.6* 8.9  MG 2.1 1.9 1.8  PHOS 2.3* 3.1 3.2   Estimated Creatinine Clearance: 28.9 mL/min (by C-G formula based on Cr of 0.65).    Recent Labs  01/30/15 2003 01/30/15 2326 01/31/15 0404  GLUCAP 151* 149* 156*    Medical History: Past Medical History  Diagnosis Date  . Edema leg   . Constipation   . LBBB (left bundle branch block)   . Allergic rhinitis   . Anxiety   . Alzheimer's dementia     Medications:  Scheduled:  . antiseptic oral rinse  7 mL Mouth Rinse QID  . aspirin  81 mg Oral Daily  . chlorhexidine gluconate  15 mL Mouth Rinse BID  . famotidine  20 mg Per Tube Daily  . free water  100 mL  Per Tube 3 times per day  . insulin aspart  0-9 Units Subcutaneous 6 times per day  . sodium chloride  3 mL Intravenous Q12H   Infusions:  . feeding supplement (VITAL AF 1.2 CAL) 1,000 mL (01/30/15 1610)    Assessment: Pharmacy consulted to assist in managing electrolytes in this 82 y/o F admitted with UTI and respiratory failure due to code blue, PEA.   Plan:  Electrolytes within normal limits. Will recheck tomorrow with AM labs.   Carola Frost, Pharm.D., BCPS Clinical Pharmacist 01/31/2015,5:21 AM

## 2015-02-01 LAB — GLUCOSE, CAPILLARY
GLUCOSE-CAPILLARY: 142 mg/dL — AB (ref 65–99)
GLUCOSE-CAPILLARY: 142 mg/dL — AB (ref 65–99)
GLUCOSE-CAPILLARY: 148 mg/dL — AB (ref 65–99)
GLUCOSE-CAPILLARY: 154 mg/dL — AB (ref 65–99)
Glucose-Capillary: 139 mg/dL — ABNORMAL HIGH (ref 65–99)
Glucose-Capillary: 128 mg/dL — ABNORMAL HIGH (ref 65–99)

## 2015-02-01 LAB — BASIC METABOLIC PANEL
Anion gap: 9 (ref 5–15)
BUN: 35 mg/dL — AB (ref 6–20)
CHLORIDE: 103 mmol/L (ref 101–111)
CO2: 23 mmol/L (ref 22–32)
Calcium: 9.3 mg/dL (ref 8.9–10.3)
Creatinine, Ser: 0.64 mg/dL (ref 0.44–1.00)
GFR calc Af Amer: >60 mL/min (ref >60)
GFR calc non Af Amer: >60 mL/min (ref >60)
Glucose, Bld: 182 mg/dL — ABNORMAL HIGH (ref 65–99)
POTASSIUM: 4.1 mmol/L (ref 3.5–5.1)
SODIUM: 135 mmol/L (ref 135–145)

## 2015-02-01 LAB — MAGNESIUM: MAGNESIUM: 1.8 mg/dL (ref 1.7–2.4)

## 2015-02-01 LAB — PHOSPHORUS: PHOSPHORUS: 2.9 mg/dL (ref 2.5–4.6)

## 2015-02-01 MED ORDER — SCOPOLAMINE 1 MG/3DAYS TD PT72
1.0000 | MEDICATED_PATCH | TRANSDERMAL | Status: DC
  Administered 2015-02-01: 1.5 mg via TRANSDERMAL
  Filled 2015-02-01: qty 1

## 2015-02-01 MED ORDER — MORPHINE SULFATE (PF) 2 MG/ML IV SOLN
1.0000 mg | INTRAVENOUS | Status: DC | PRN
  Administered 2015-02-02: 2 mg via INTRAVENOUS
  Filled 2015-02-01: qty 1

## 2015-02-01 NOTE — Progress Notes (Signed)
Nutrition Follow-up  DOCUMENTATION CODES:   Severe malnutrition in context of chronic illness  INTERVENTION:   EN: continue enteral nutrition as tolerated by pt as per order sets until pt extubated   NUTRITION DIAGNOSIS:   Inadequate oral intake related to inability to eat as evidenced by NPO status.  GOAL:   Patient will meet greater than or equal to 90% of their needs  MONITOR:    (Energy Intake, Anthropometrics, Electrolyte and rneal Profile, Digestive System, Glucose Profile)  REASON FOR ASSESSMENT:   Consult Poor PO  ASSESSMENT:   Pt remains on vent, not on sedation; Noted plan is for one-way extubation on 1/25 with focus on comfort. No plan for trach and PEG  EN: tolerating Vital 1.2 at rate of 45 ml/hr  Digestive System: no signs of TF intolerance Electrolyte and Renal Profile:  Recent Labs Lab 01/30/15 0414 01/31/15 0429 02/01/15 0501  BUN 26* 31* 35*  CREATININE 0.60 0.65 0.64  NA 136 135 135  K 4.4 4.3 4.1  MG 1.9 1.8 1.8  PHOS 3.1 3.2 2.9   Glucose Profile:  Recent Labs  02/01/15 0442 02/01/15 0722 02/01/15 1128  GLUCAP 139* 148* 142*   Meds: reviewed  Height:   Ht Readings from Last 1 Encounters:  01/25/15  (1.422 m)    Weight:   Wt Readings from Last 1 Encounters:  02/01/15 100 lb 15.5 oz (45.8 kg)   BMI:  Body mass index is 22.65 kg/(m^2).  Estimated Nutritional Needs:   Kcal:  TEE 1340 kcals/d (Ve 16, Tmax 38.3)  Protein:  (1.2-2.0 g/kg) 50-84 g/d)  Fluid:  (25-7ml/kg) 1050-1254ml/d  EDUCATION NEEDS:   No education needs identified at this time  HIGH Care Level  Romelle Starcher MS, RD, LDN [phone] Pager  [phone] Weekend/On-Call Pager

## 2015-02-01 NOTE — Progress Notes (Signed)
[facility] - [place] at [facility]   PATIENT NAME: Christine Vance    MR#:  [id number]  DATE OF BIRTH:  [birth year]  SUBJECTIVE:  CHIEF COMPLAINT:   Chief Complaint  Patient presents with  . Urinary Tract Infection    unresponsive   Sent from NH- found with UTI, malnourished, and dehydrated.  Patient coded ,PEA , arrest time 8-10 min  and was moved to intensive care unit and got intubated 1/14.Not on any sedation.  1/19 patient was extubated, but could not tolerate got reintubated. No family members at bedside. Remains on vent support.  POA agreed on extubation and no reintubation on 02/02/15. I spoke to POA_ family will arise around [place] time.   Pt is not on any sedation, have some movements but does not follow commands/ wake up.   REVIEW OF SYSTEMS:   Limited review of systems Review of Systems  Unable to perform ROS   DRUG ALLERGIES:   Allergies  Allergen Reactions  . Codeine Sulfate Other (See Comments)    unknown  . Ivp Dye [Iodinated Diagnostic Agents] Other (See Comments)    unknown  . Lansoprazole Other (See Comments)    unknown  . Losartan Potassium-Hctz Other (See Comments)    unknown  . Penicillins Other (See Comments)    unknown  . Pravachol [Pravastatin] Other (See Comments)    unknown  . Prednisone Other (See Comments)    unknown  . Sulfa Antibiotics Other (See Comments)    unknown  . Sulfonamide Derivatives Other (See Comments)    unknown    VITALS:  Blood pressure 171/86, pulse 110, temperature 99.7 F (37.6 C), temperature source Rectal, resp. rate 17, height  (1.422 m), weight 45.8 kg (100 lb 15.5 oz), SpO2 100 %.  PHYSICAL EXAMINATION:   GENERAL: 82 y.o.-year-old patient lying in the bed with no acute distress.  EYES: Pupils equal, round, reactive to light sluggishly. No scleral icterus.   HEENT: Head atraumatic, normocephalic. ET tube intact . Positive gag reflex per RN report NECK: Supple, no jugular  venous distention. No thyroid enlargement, no tenderness.  LUNGS: Diminished  breath sounds bilaterally, no wheezing, rales, rhonchi. No use of accessory muscles of respiration.  CARDIOVASCULAR: S1, S2 normal. No murmurs, rubs, or gallops.  ABDOMEN: Soft, nontender, nondistended. Bowel sounds present. No organomegaly or mass.  EXTREMITIES: peripheral  edema, no cyanosis, or clubbing. + 2 pedal & radial pulses b/l. Diffuse muscle wasting  NEUROLOGIC: Intubated, no sedation, moves hand and feet some, but not following commands. Does not open eyes. Have grasp reflex on hands, holding my finger. PSYCHIATRIC: The patient is intubated, not on  Sedatives PICC line in place  Physical Exam LABORATORY PANEL:   CBC  Recent Labs Lab 01/29/15 0405  WBC 12.3*  HGB 7.2*  HCT 22.6*  PLT 373   ------------------------------------------------------------------------------------------------------------------  Chemistries   Recent Labs Lab 02/01/15 0501  NA 135  K 4.1  CL 103  CO2 23  GLUCOSE 182*  BUN 35*  CREATININE 0.64  CALCIUM 9.3  MG 1.8   ------------------------------------------------------------------------------------------------------------------  Cardiac Enzymes No results for input(s): TROPONINI in the last 168 hours. ------------------------------------------------------------------------------------------------------------------  RADIOLOGY:  No results found.  ASSESSMENT AND PLAN:   Active Problems:   UTI (lower urinary tract infection)   Protein-calorie malnutrition, severe   Dyspnea   Pressure ulcer   Acute respiratory failure (HCC)   Altered mental status  *Acute hypoxic respiratory failure secondary to aspiration PNA On vancomycin and invanz  for broad abx. Vancomycin discontinued and   finished invanze course.  currently not on any ABx.  * Cardiac arrest -PEA Intubated Extubated 1/19 by intensivist but got reintubated as patient could not  tolerate DNR now, POA agreed to extubate and not re intubate on 02/02/15.   * Acute encephalopathy secondary to sepsis secondary to possible aspiration pneumonia, superimposed on acute cystitis from ESBL/failure to thrive with poor by mouth intake- on admisison, and later after cardiac arrest- seems to have some anoxic brain injury. Pt is NOT comatose. Finished Ertapenem, appreciated ID consult. There may be some brain injury due to cardiac arrest, as she is not requiring any sedation, and still not waking up. POA decided extubation plan on  1/25/ at noon time.  * ESBL UTI   Given ertapenem.    Finished course.  * Hyponatremia with severe dehydration-resolved with IV fluids  * Severe malnutrition On TFs  * ARF secondary to dehydration from poor by mouth intake Resolved with IV fluids  poor prognosis  CODE STATUS: DNR  TOTAL CRITICAL CARE TIME TAKING CARE OF THIS PATIENT: .  I spoke to [facility]- Ms Weitzman again today.   She informed- She and family will come here tomorrow around noon time, and they want [name] to wait until then.  Altamese Dilling M.D on 02/01/2015   Between 7am to 6pm - Pager - [phone].  After 6pm go to [email or web address] - password [email or web address] [facility]  [place] [facility]  Office  [phone]  CC: Primary care physician; Keane Police, MD  Note: This dictation was prepared with Dragon dictation along with smaller phrase technology. Any transcriptional errors that result from this process are unintentional.

## 2015-02-01 NOTE — Progress Notes (Signed)
Pharmacy Consult for Electrolyte Monitoring  Allergies  Allergen Reactions  . Codeine Sulfate Other (See Comments)    unknown  . Ivp Dye [Iodinated Diagnostic Agents] Other (See Comments)    unknown  . Lansoprazole Other (See Comments)    unknown  . Losartan Potassium-Hctz Other (See Comments)    unknown  . Penicillins Other (See Comments)    unknown  . Pravachol [Pravastatin] Other (See Comments)    unknown  . Prednisone Other (See Comments)    unknown  . Sulfa Antibiotics Other (See Comments)    unknown  . Sulfonamide Derivatives Other (See Comments)    unknown    Patient Measurements: Height:  (142.2 cm) Weight: 100 lb 15.5 oz (45.8 kg) IBW/kg (Calculated) : 36.3  Vital Signs: Temp: 99.1 F (37.3 C) (01/24 1200) Temp Source: Rectal (01/24 1200) BP: 156/67 mmHg (01/24 1200) Pulse Rate: 90 (01/24 1200) Intake/Output from previous day: 01/23 0701 - 01/24 0700 In: 1183 [I.V.:3; NG/GT:1180] Out: 2000 [Urine:2000] Intake/Output from this shift: Total I/O In: 325 [NG/GT:325] Out: -   Labs: No results for input(s): WBC, HGB, HCT, PLT, APTT, INR in the last 72 hours.   Recent Labs  01/30/15 0414 01/31/15 0429 02/01/15 0501  NA 136 135 135  K 4.4 4.3 4.1  CL 107 104 103  CO2 GLUCOSE 172* 181* 182*  BUN 26* 31* 35*  CREATININE 0.60 0.65 0.64  CALCIUM 8.6* 8.9 9.3  MG 1.9 1.8 1.8  PHOS 3.1 3.2 2.9   Estimated Creatinine Clearance: 28.4 mL/min (by C-G formula based on Cr of 0.64).    Recent Labs  02/01/15 0442 02/01/15 0722 02/01/15 1128  GLUCAP 139* 148* 142*    Medical History: Past Medical History  Diagnosis Date  . Edema leg   . Constipation   . LBBB (left bundle branch block)   . Allergic rhinitis   . Anxiety   . Alzheimer's dementia     Medications:  Scheduled:  . antiseptic oral rinse  7 mL Mouth Rinse QID  . aspirin  81 mg Oral Daily  . chlorhexidine gluconate  15 mL Mouth Rinse BID  . famotidine  20 mg Per Tube  Daily  . free water  100 mL Per Tube 3 times per day  . insulin aspart  0-9 Units Subcutaneous 6 times per day  . scopolamine  1 patch Transdermal Q72H  . sodium chloride  3 mL Intravenous Q12H   Infusions:  . feeding supplement (VITAL AF 1.2 CAL) 1,000 mL (02/01/15 0756)    Assessment: Pharmacy consulted to assist in managing electrolytes in this 82 y/o F admitted with UTI and respiratory failure due to code blue, PEA.   Plan:  Electrolyte remain within normal limits. Will recheck with am labs on 1/26.    Pharmacy will continue to monitor and adjust per consult.     Kyndal Heringer L, Pharm.D. Clinical Pharmacist 02/01/2015,3:21 PM

## 2015-02-01 NOTE — Progress Notes (Signed)
PCCM F/U NOTE  SUBJ: RASS -4. Remains intubated on full vent support. Occasional spontaneous movement, nonpurposeful and not to command.   OBJ: Filed Vitals:   02/01/15 0500 02/01/15 0600 02/01/15 0700 02/01/15 0800  BP: 158/74 161/66 157/72 166/77  Pulse: 92 91 90 93  Temp: 99.5 F (37.5 C)   98.8 F (37.1 C)  TempSrc:    Rectal  Resp: Height:      Weight:      SpO2: 100% 100% 100% 98%   Frail,, intubated, RASS -4 HEENT: PERRL + JVD Diminished BS, no wheezes Reg, no M Abd soft, +BS Symmetric LE edema  BMP Latest Ref Rng 02/01/2015 01/31/2015 01/30/2015  Glucose 65 - 99 mg/dL 161(W) 960(A) 540(J)  BUN 6 - 20 mg/dL 81(X) 91(Y) 78(G)  Creatinine 0.44 - 1.00 mg/dL 9.56 2.13 0.86  Sodium 135 - 145 mmol/L 135 135 136  Potassium 3.5 - 5.1 mmol/L 4.1 4.3 4.4  Chloride 101 - 111 mmol/L 103 104 107  CO2 22 - 32 mmol/L Calcium 8.9 - 10.3 mg/dL 9.3 8.9 5.7(Q)    CBC Latest Ref Rng 01/29/2015 01/28/2015 01/26/2015  WBC 3.6 - 11.0 K/uL 12.3(H) 10.7 11.1(H)  Hemoglobin 12.0 - 16.0 g/dL 7.2(L) 7.5(L) 8.1(L)  Hematocrit 35.0 - 47.0 % 22.6(L) 23.8(L) 25.5(L)  Platelets 150 - 440 K/uL 373 346 288    CXR: No new film  IMPRESSION: Admitted with ESBL UTI PEA arrest 01/22/15 Hypotension, resolved VDRF since arrest, failed extubation 01/27/15 Advanced Alzheimer's dementia Post anoxic encephalopathy L MCA territory CVA by CT head 01/26/15  PLAN/REC: Cont full vent support - settings reviewed and/or adjusted Cont vent bundle Daily SBT if/when meets criteria Monitor BMET intermittently Monitor I/Os Correct electrolytes as indicated Currently not requiring any sedation   Dr. Sung Amabile discussed goals of care in depth with the pt's ex-daughter in law who is HCPOA and with her granddaughter. She expressed understanding that the prognosis for a functional or meaningful recovery is nil. To proceed with trach and G tube would simply commit her to prolonged care  similar to her current state in an [facility] or SNF setting. She agrees with DNR and intends to proceed with extubation in the first of the week with no re-intubation. At this point, there is no plan for trach tube or G tube placement  We will minimize interventions that are not directed at pt's comfort.  HCPOA stated that she is awaiting on more family members, and will go through withdrawal of care on 1/25.  She was also updated on the treatment plan and current clinical status  CCM Time - 40 mins  Stephanie Acre, MD [place] [facility] Pager [phone] (please enter 7-digits) On Call Pager - [phone] (please enter 7-digits)

## 2015-02-02 LAB — GLUCOSE, CAPILLARY
GLUCOSE-CAPILLARY: 159 mg/dL — AB (ref 65–99)
Glucose-Capillary: 157 mg/dL — ABNORMAL HIGH (ref 65–99)

## 2015-02-02 MED ORDER — MORPHINE 100MG IN NS 100ML (1MG/ML) PREMIX INFUSION
10.0000 mg/h | INTRAVENOUS | Status: DC
  Administered 2015-02-02 – 2015-02-03 (×3): 10 mg/h via INTRAVENOUS
  Filled 2015-02-02 (×3): qty 100

## 2015-02-02 MED ORDER — LORAZEPAM 2 MG/ML IJ SOLN
1.0000 mg | INTRAMUSCULAR | Status: DC | PRN
  Administered 2015-02-02: 2 mg via INTRAVENOUS
  Filled 2015-02-02: qty 1

## 2015-02-02 MED ORDER — MORPHINE BOLUS VIA INFUSION
5.0000 mg | INTRAVENOUS | Status: DC | PRN
  Administered 2015-02-02: 10 mg via INTRAVENOUS
  Filled 2015-02-02 (×2): qty 20

## 2015-02-02 NOTE — Clinical Social Work Note (Signed)
CSW consult placed in order to notify CSW of comfort care orders for patient.  York Spaniel MSW,LCSW [phone]

## 2015-02-02 NOTE — Progress Notes (Signed)
Nutrition Brief Note  Chart reviewed. Pt now transitioning to comfort care. Extubated, NPO. No further nutrition interventions warranted at this time.  Please re-consult as needed.   Romelle Starcher MS, RD, LDN [phone] Pager  [phone] Weekend/On-Call Pager

## 2015-02-02 NOTE — Progress Notes (Signed)
Report called to Trula Ore, RN on 1C. Patient comfort care, ST on cardiac monitor. RA. Morphine drip at 10 mg/hr through left UE PICC. Foley catheter in place.  Patient daughter-in-law, Cordelia Pen, has gone home now. Gave her telephone number to 1C and advised she would be transferring to rm 106.

## 2015-02-02 NOTE — Progress Notes (Signed)
PCCM F/U NOTE  SUBJ: RASS -4. Remains intubated on full vent support. Occasional spontaneous movement, nonpurposeful and not to command, will withdraw to pain and touch.    OBJ: Filed Vitals:   02/02/15 0300 02/02/15 0400 02/02/15 0500 02/02/15 0600  BP: 118/67 125/61 100/52 134/62  Pulse: 98 99 98 94  Temp: 100.4 F (38 C) 100.4 F (38 C) 100.4 F (38 C)   TempSrc:      Resp: Height:      Weight:      SpO2: 100% 99% 100% 100%   ROS - unable to obtain - critically ill, intubated, non verbal  Frail,, intubated, RASS -4 HEENT: PERRL + JVD Diminished BS, no wheezes Reg, no M Abd soft, +BS Symmetric LE edema Neuro - will withdraw to pain on right UE and LE.   BMP Latest Ref Rng 02/01/2015 01/31/2015 01/30/2015  Glucose 65 - 99 mg/dL 784(O) 962(X) 528(U)  BUN 6 - 20 mg/dL 13(K) 44(W) 10(U)  Creatinine 0.44 - 1.00 mg/dL 7.25 3.66 4.40  Sodium 135 - 145 mmol/L 135 135 136  Potassium 3.5 - 5.1 mmol/L 4.1 4.3 4.4  Chloride 101 - 111 mmol/L 103 104 107  CO2 22 - 32 mmol/L Calcium 8.9 - 10.3 mg/dL 9.3 8.9 3.4(V)    CBC Latest Ref Rng 01/29/2015 01/28/2015 01/26/2015  WBC 3.6 - 11.0 K/uL 12.3(H) 10.7 11.1(H)  Hemoglobin 12.0 - 16.0 g/dL 7.2(L) 7.5(L) 8.1(L)  Hematocrit 35.0 - 47.0 % 22.6(L) 23.8(L) 25.5(L)  Platelets 150 - 440 K/uL 373 346 288    CXR: No new film  IMPRESSION: Admitted with ESBL UTI PEA arrest 01/22/15 Hypotension, resolved VDRF since arrest, failed extubation 01/27/15 Advanced Alzheimer's dementia Post anoxic encephalopathy L MCA territory CVA by CT head 01/26/15  PLAN/REC: - HCPOA and other family member at bedside, RN Pam witness, family agreed today to withdraw care and initiate comfort measures today. Family members question answered  - withdrawal care orders placed.    CCM Time - 35 mins  Stephanie Acre, MD [place] [facility] Pager [phone] (please enter 7-digits) On Call Pager - [phone]  (please enter 7-digits)

## 2015-02-02 NOTE — Progress Notes (Signed)
Placed on high fowlers position, cuff deflatedd, suctioned orally and endotracheally and then extubated

## 2015-02-02 NOTE — Progress Notes (Signed)
Dr Dema Severin has spoken with family earlier, decision made by family and Cordelia Pen, daughter-in-law for extubation and comfort care measures. Orders entered for these by MD. Morphine drip initiated at 10 mg/hr and 2 mg Lorazepam IV administered. Family members at bedside, and patient extubated by Greggory Stallion, RT. Patient ST on cardiac monitor, no WOB or distress noted. Supported family members and advised could give additional medications if necessary. Continue to monitor.

## 2015-02-02 NOTE — Progress Notes (Signed)
[facility] - [place] at [facility]   PATIENT NAME: Christine Vance    MR#:  [id number]  DATE OF BIRTH:  1920-07-08  SUBJECTIVE:  CHIEF COMPLAINT:   Chief Complaint  Patient presents with  . Urinary Tract Infection    unresponsive   Sent from NH- found with UTI, malnourished, and dehydrated.  Patient coded ,PEA , arrest time 8-10 min  and was moved to intensive care unit and got intubated 1/14.Not on any sedation.  1/19 patient was extubated, but could not tolerate got reintubated. Remains on vent support.  POA agreed on extubation and no reintubation on 02/02/15. Waiting for arrival of family.  Pt is not on any sedation, have some movements but does not follow commands/ wake up.   REVIEW OF SYSTEMS:   Limited review of systems Review of Systems  Unable to perform ROS   DRUG ALLERGIES:   Allergies  Allergen Reactions  . Codeine Sulfate Other (See Comments)    unknown  . Ivp Dye [Iodinated Diagnostic Agents] Other (See Comments)    unknown  . Lansoprazole Other (See Comments)    unknown  . Losartan Potassium-Hctz Other (See Comments)    unknown  . Penicillins Other (See Comments)    unknown  . Pravachol [Pravastatin] Other (See Comments)    unknown  . Prednisone Other (See Comments)    unknown  . Sulfa Antibiotics Other (See Comments)    unknown  . Sulfonamide Derivatives Other (See Comments)    unknown    VITALS:  Blood pressure 144/73, pulse 110, temperature 99.7 F (37.6 C), temperature source Rectal, resp. rate 11, height  (1.422 m), weight 45.8 kg (100 lb 15.5 oz), SpO2 91 %.  PHYSICAL EXAMINATION:   GENERAL: 82 y.o.-year-old patient lying in the bed with no acute distress.  EYES: Pupils equal, round, reactive to light sluggishly. No scleral icterus.   HEENT: Head atraumatic, normocephalic. ET tube intact . Positive gag reflex per RN report NECK: Supple, no jugular venous distention. No thyroid enlargement, no tenderness.   LUNGS: Diminished  breath sounds bilaterally, no wheezing, rales, rhonchi. No use of accessory muscles of respiration.  CARDIOVASCULAR: S1, S2 normal. No murmurs, rubs, or gallops.  ABDOMEN: Soft, nontender, nondistended. Bowel sounds present. No organomegaly or mass.  EXTREMITIES: peripheral  edema, no cyanosis, or clubbing. + 2 pedal & radial pulses b/l. Diffuse muscle wasting  NEUROLOGIC: Intubated, no sedation, moves hand and feet some, but not following commands. Does not open eyes. Have grasp reflex on hands, holding my finger. PSYCHIATRIC: The patient is intubated, not on  Sedatives PICC line in place  Physical Exam LABORATORY PANEL:   CBC  Recent Labs Lab 01/29/15 0405  WBC 12.3*  HGB 7.2*  HCT 22.6*  PLT 373   ------------------------------------------------------------------------------------------------------------------  Chemistries   Recent Labs Lab 02/01/15 0501  NA 135  K 4.1  CL 103  CO2 23  GLUCOSE 182*  BUN 35*  CREATININE 0.64  CALCIUM 9.3  MG 1.8   ------------------------------------------------------------------------------------------------------------------  Cardiac Enzymes No results for input(s): TROPONINI in the last 168 hours. ------------------------------------------------------------------------------------------------------------------  RADIOLOGY:  No results found.  ASSESSMENT AND PLAN:   Active Problems:   UTI (lower urinary tract infection)   Protein-calorie malnutrition, severe   Dyspnea   Pressure ulcer   Acute respiratory failure (HCC)   Altered mental status  *Acute hypoxic respiratory failure secondary to aspiration PNA On vancomycin and invanz for broad abx. Vancomycin discontinued and   finished invanze course.  currently not on any ABx.  * Cardiac arrest -PEA Intubated Extubated 1/19 by intensivist but got reintubated as patient could not tolerate DNR now, POA agreed to extubate and not re intubate on  02/02/15.   * Acute encephalopathy secondary to sepsis secondary to possible aspiration pneumonia, superimposed on acute cystitis from ESBL/failure to thrive with poor by mouth intake- on admisison, and later after cardiac arrest- seems to have some anoxic brain injury. Pt is NOT comatose. There may be some brain injury due to cardiac arrest, as she is not requiring any sedation, and still not waking up.minimal spontaneous  Non meaningful movements. POA decided extubation plan on  1/25/ at noon time.  waiting for family arrival, plan is comfort care after extubation.  * ESBL UTI   Given ertapenem.    Finished course.  * Hyponatremia with severe dehydration-resolved with IV fluids  * Severe malnutrition On TFs  * ARF secondary to dehydration from poor by mouth intake Resolved with IV fluids  poor prognosis  CODE STATUS: DNR  TOTAL TIME TAKING CARE OF THIS PATIENT: .   Wait till family arrives and then terminal extubation and comfort care.  Altamese Dilling M.D on 02/02/2015   Between 7am to 6pm - Pager - [phone].  After 6pm go to [email or web address] - password [email or web address] [facility]  [place] [facility]  Office  [phone]  CC: Primary care physician; Keane Police, MD  Note: This dictation was prepared with Dragon dictation along with smaller phrase technology. Any transcriptional errors that result from this process are unintentional.

## 2015-02-02 NOTE — Care Management (Signed)
Anticipate extubation- comfort care-  and not to be reintubated within the next 24 hours.  Awaiting arrival of some family members.

## 2015-02-02 NOTE — Progress Notes (Signed)
Pharmacy Consult for Electrolyte Monitoring  Allergies  Allergen Reactions  . Codeine Sulfate Other (See Comments)    unknown  . Ivp Dye [Iodinated Diagnostic Agents] Other (See Comments)    unknown  . Lansoprazole Other (See Comments)    unknown  . Losartan Potassium-Hctz Other (See Comments)    unknown  . Penicillins Other (See Comments)    unknown  . Pravachol [Pravastatin] Other (See Comments)    unknown  . Prednisone Other (See Comments)    unknown  . Sulfa Antibiotics Other (See Comments)    unknown  . Sulfonamide Derivatives Other (See Comments)    unknown    Patient Measurements: Height:  (142.2 cm) Weight: 100 lb 15.5 oz (45.8 kg) IBW/kg (Calculated) : 36.3  Vital Signs: Temp: 100.4 F (38 C) (01/25 0500) BP: 134/62 mmHg (01/25 0600) Pulse Rate: 94 (01/25 0600) Intake/Output from previous day: 01/24 0701 - 01/25 0700 In: 1260 [NG/GT:1190] Out: 725 [Urine:725] Intake/Output from this shift:    Labs: No results for input(s): WBC, HGB, HCT, PLT, APTT, INR in the last 72 hours.   Recent Labs  01/31/15 0429 02/01/15 0501  NA 135 135  K 4.3 4.1  CL 104 103  CO2 26 23  GLUCOSE 181* 182*  BUN 31* 35*  CREATININE 0.65 0.64  CALCIUM 8.9 9.3  MG 1.8 1.8  PHOS 3.2 2.9   Estimated Creatinine Clearance: 28.4 mL/min (by C-G formula based on Cr of 0.64).    Recent Labs  02/01/15 1946 02/01/15 2332 02/02/15 0602  GLUCAP 128* 142* 159*    Medical History: Past Medical History  Diagnosis Date  . Edema leg   . Constipation   . LBBB (left bundle branch block)   . Allergic rhinitis   . Anxiety   . Alzheimer's dementia     Medications:  Scheduled:  . antiseptic oral rinse  7 mL Mouth Rinse QID  . aspirin  81 mg Oral Daily  . chlorhexidine gluconate  15 mL Mouth Rinse BID  . famotidine  20 mg Per Tube Daily  . free water  100 mL Per Tube 3 times per day  . insulin aspart  0-9 Units Subcutaneous 6 times per day  . scopolamine  1 patch  Transdermal Q72H  . sodium chloride  3 mL Intravenous Q12H   Infusions:  . feeding supplement (VITAL AF 1.2 CAL) 1,000 mL (02/01/15 0756)    Assessment: Pharmacy consulted to assist in managing electrolytes in this 82 y/o F admitted with UTI and respiratory failure.   Plan:  Electrolytes remain within normal limits. Will recheck with am labs on 1/26.    Pharmacy will continue to monitor and adjust per consult.     Simpson,Michael L, Pharm.D. Clinical Pharmacist 02/02/2015,8:52 AM

## 2015-02-02 NOTE — Progress Notes (Signed)
Chaplain rounded in the unit and provided a compassionate presence and support to the family with prayer. Jefm Petty [phone]

## 2015-02-09 NOTE — Discharge Summary (Signed)
Patient died on 01/14/2015  Cause of death- UTI  Other contributing medical issues- old age, malnutrition.  Hospital course and stay    Patient had gradual loss of function and appetite or last few months, and stayed mostly bedbound with minimal oral intake. Sent from nursing home to the hospital because she was very drowsy, found to have UTI with ESBL- started treatment for that, but did not had significant improvement and she also had an episode of cardiac arrest where she was intubated and placed in ICU. She stayed on ventilator for many days without significant improvement in her mental condition overall health. Family finally decided to make her DO NOT RESUSCITATE and terminal extubation. Which was done on 02/14/15 Patient died next day.

## 2015-02-09 NOTE — Clinical Social Work Note (Signed)
Pt expired today at 1005. CSW is signing off as no further needs identified.   Dede Query, MSW, LCSW  Clinical Social Worker  [phone]

## 2015-02-09 NOTE — Progress Notes (Signed)
Patient passed away at 10:05.  Pronounced deceased by Ellison Hughs  RN and Shon Millet RN,  MD notified, Nursing Supervisor notified, and patients daughter in law Miliana Gangwer, per daughter in law [facility] will handle arrangements for death.    CDS rep contacted and gave patient a full release from organ donation due to age CDC #  [id number]  Rep name Daria Pastures

## 2015-02-09 NOTE — Care Management Important Message (Signed)
Important Message  Patient Details  Name: Christine Vance MRN: [id number] Date of Birth: Oct 31, 1920   Medicare Important Message Given:  Yes    Gwenette Greet, RN 2015/02/13, 11:31 AM

## 2015-02-09 DEATH — deceased

## 2016-09-14 IMAGING — CR DG ABDOMEN 1V
1 series · 1 of 1 positions shown · non-contrast
Comparison: None.

CLINICAL DATA: OG tube placement.

EXAM:
ABDOMEN - 1 VIEW

[ap]
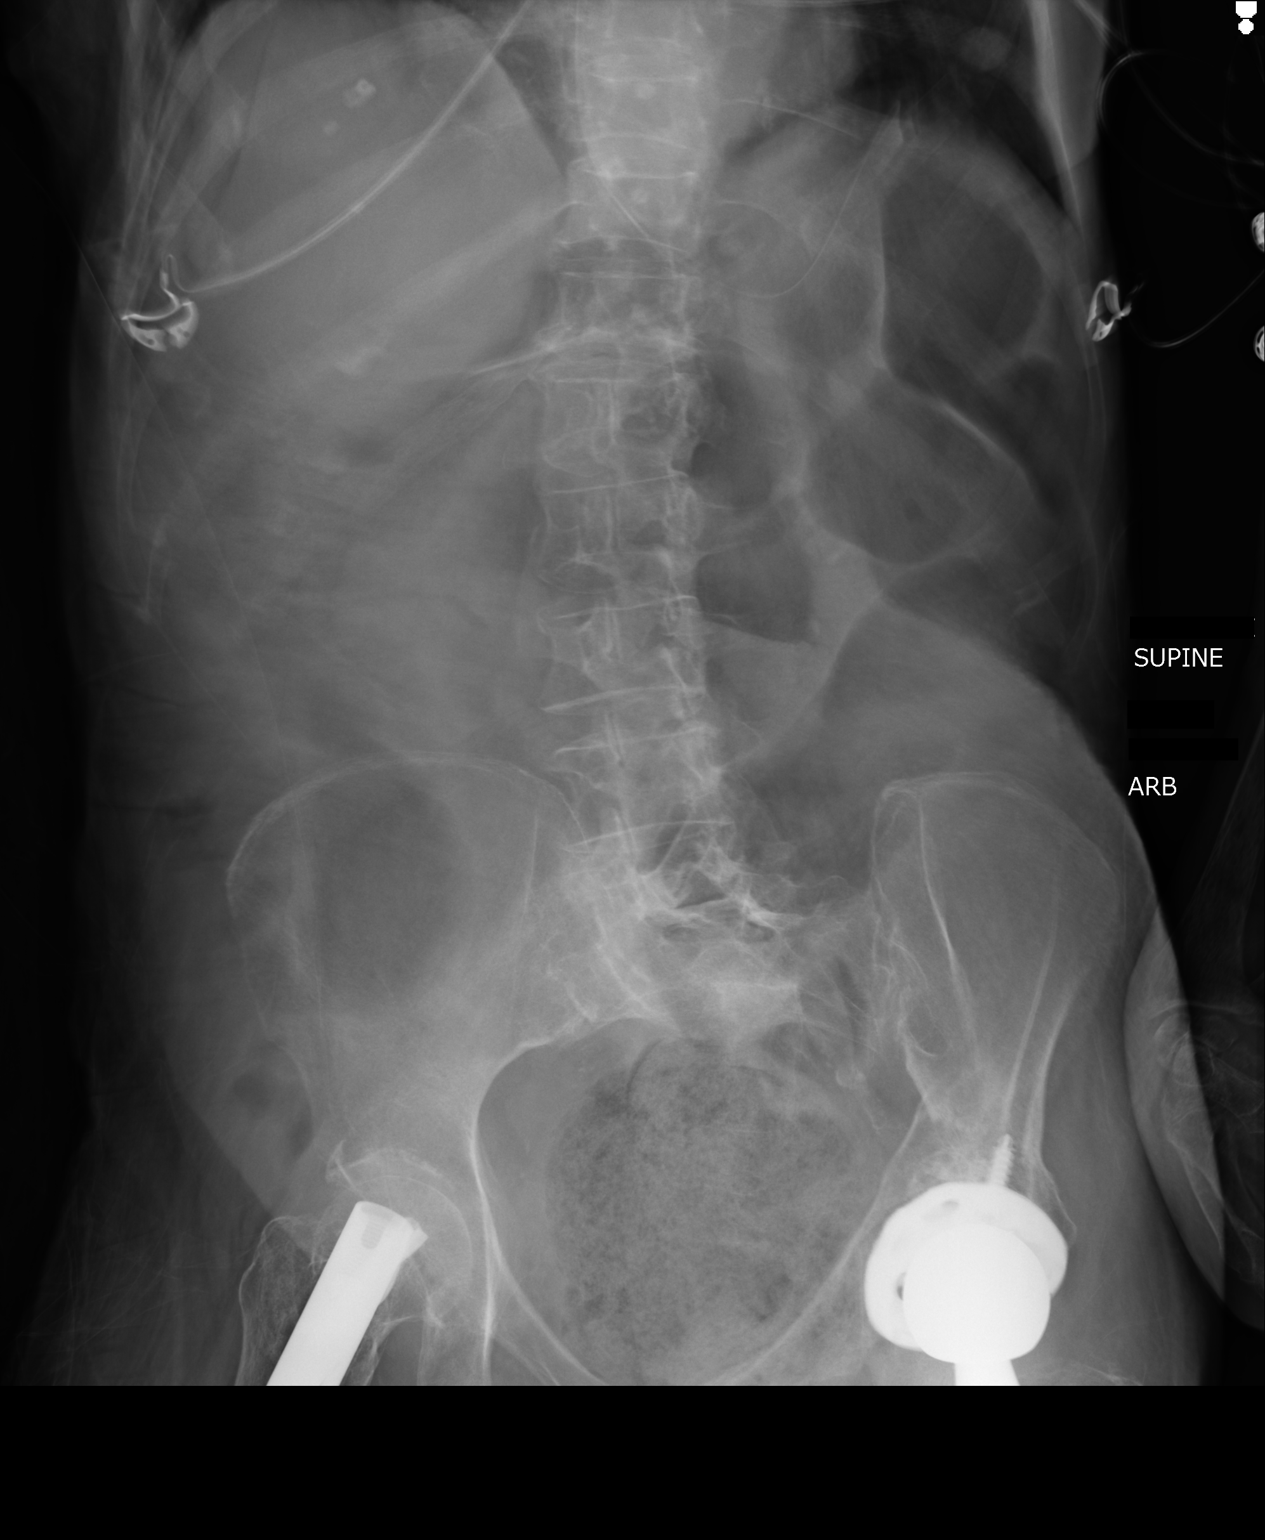

[1 of 1 positions shown; findings below may reference images not displayed]

FINDINGS: Orogastric tube extends through the GE junction into the proximal
stomach.

Mild gaseous distention of the bowel is noted. There is moderate
increased stool distending the rectum. No overt obstruction.
IMPRESSION: 1. Orogastric tube passes below the diaphragm into the proximal
stomach.
2. Mild gaseous distention of the bowel without convincing
obstruction. Moderate increased stool in the rectum.

## 2016-09-14 IMAGING — CR DG CHEST 1V
1 series · 1 of 1 positions shown · non-contrast
Comparison: CT 01/17/2015

CLINICAL DATA: Acute respiratory failure, former smoker.

EXAM:
CHEST 1 VIEW

[ap]
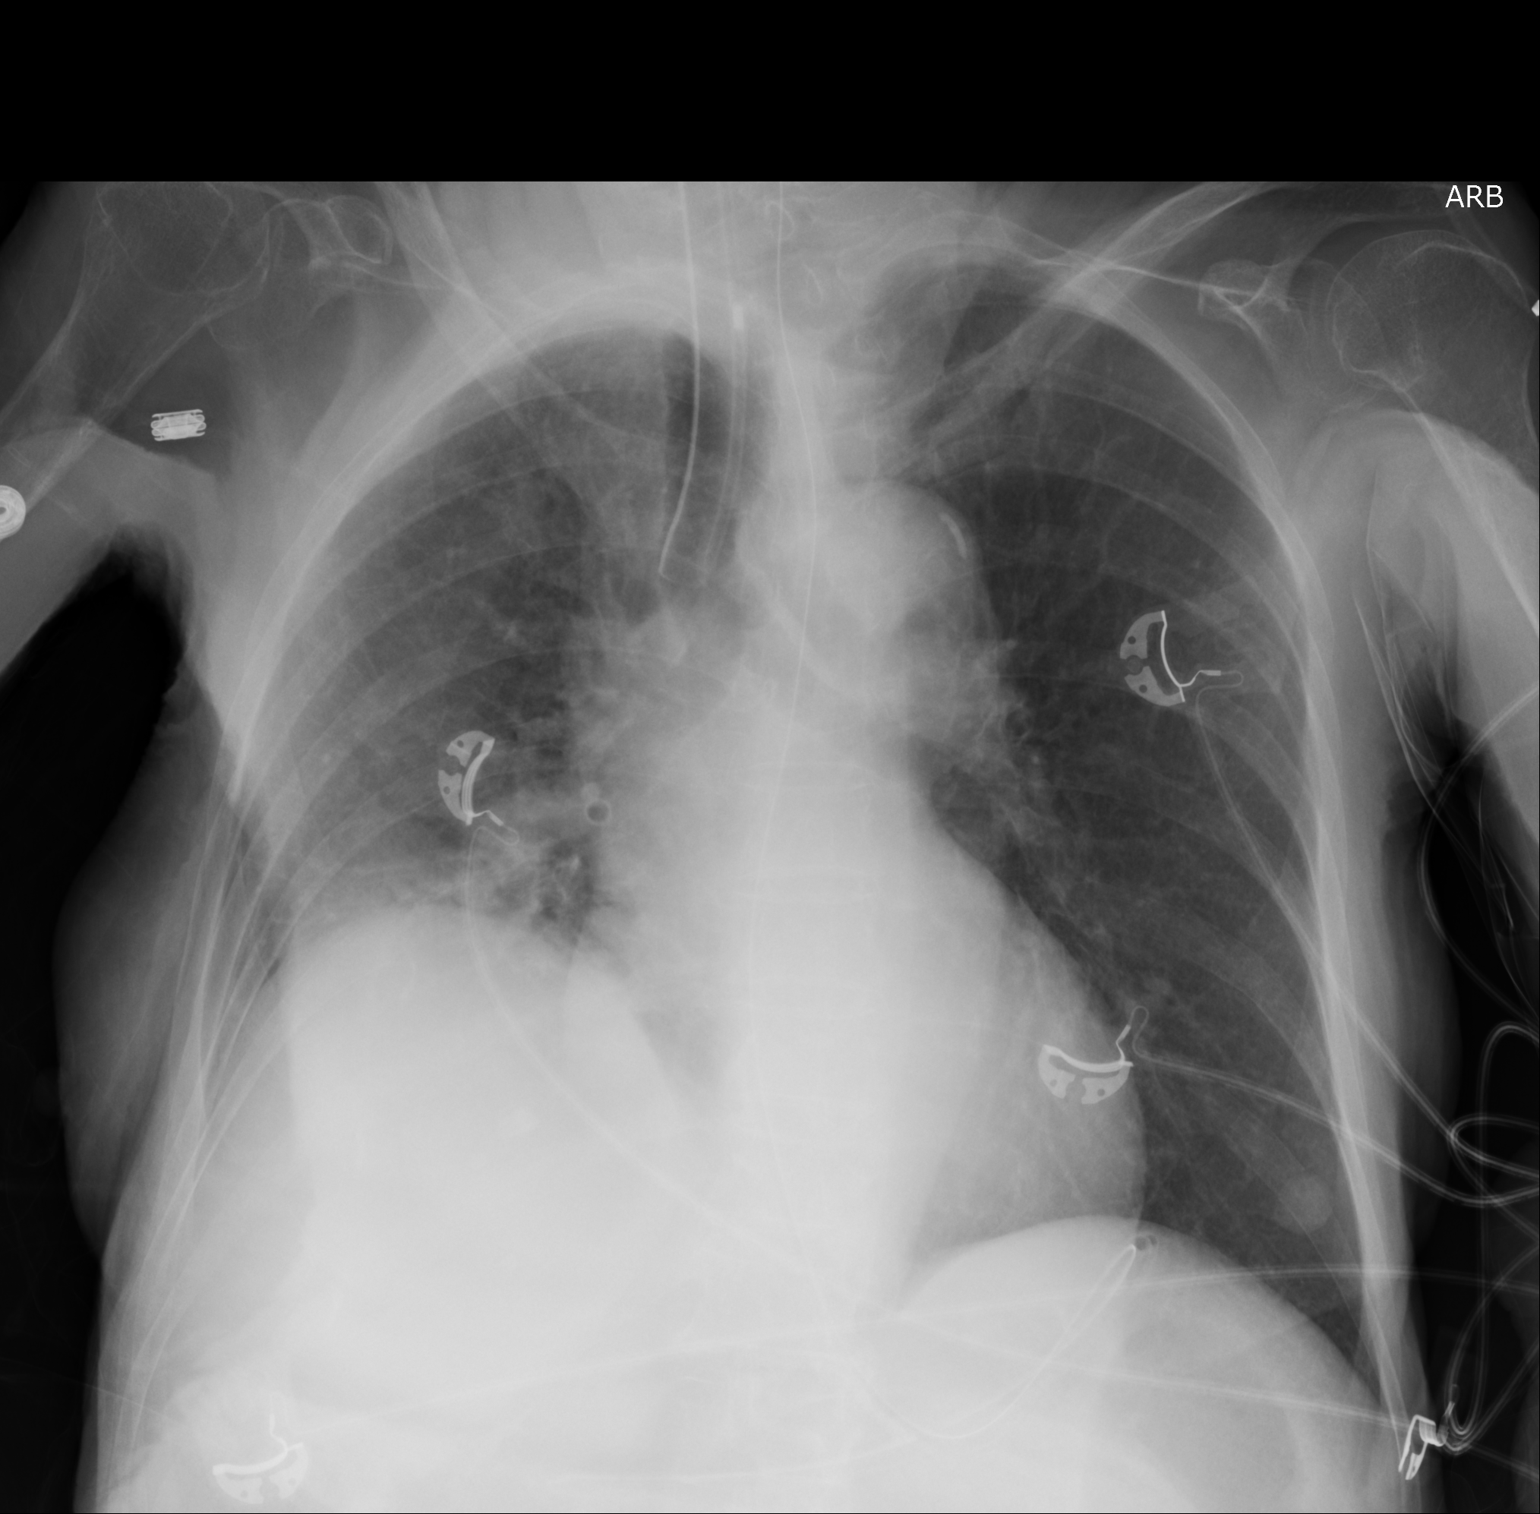

[1 of 1 positions shown; findings below may reference images not displayed]

FINDINGS: Endotracheal tube 2.5 cm from carina. Elevation of the RIGHT
hemidiaphragm is new. Associated RIGHT lower lobe atelectasis. No
pulmonary edema. No pneumothorax. NG tube extends the stomach.

Rounded density over the LEFT lung base likely represents nipple
shadow.
IMPRESSION: 1. Endotracheal tube in good position.
2. New elevation of the RIGHT hemidiaphragm with associated
atelectasis.

## 2016-09-19 IMAGING — CR DG CHEST 1V PORT
1 series · 1 of 1 positions shown · non-contrast
Comparison: January 25, 2015

CLINICAL DATA: Hypoxia

EXAM:
PORTABLE CHEST 1 VIEW

[ap]
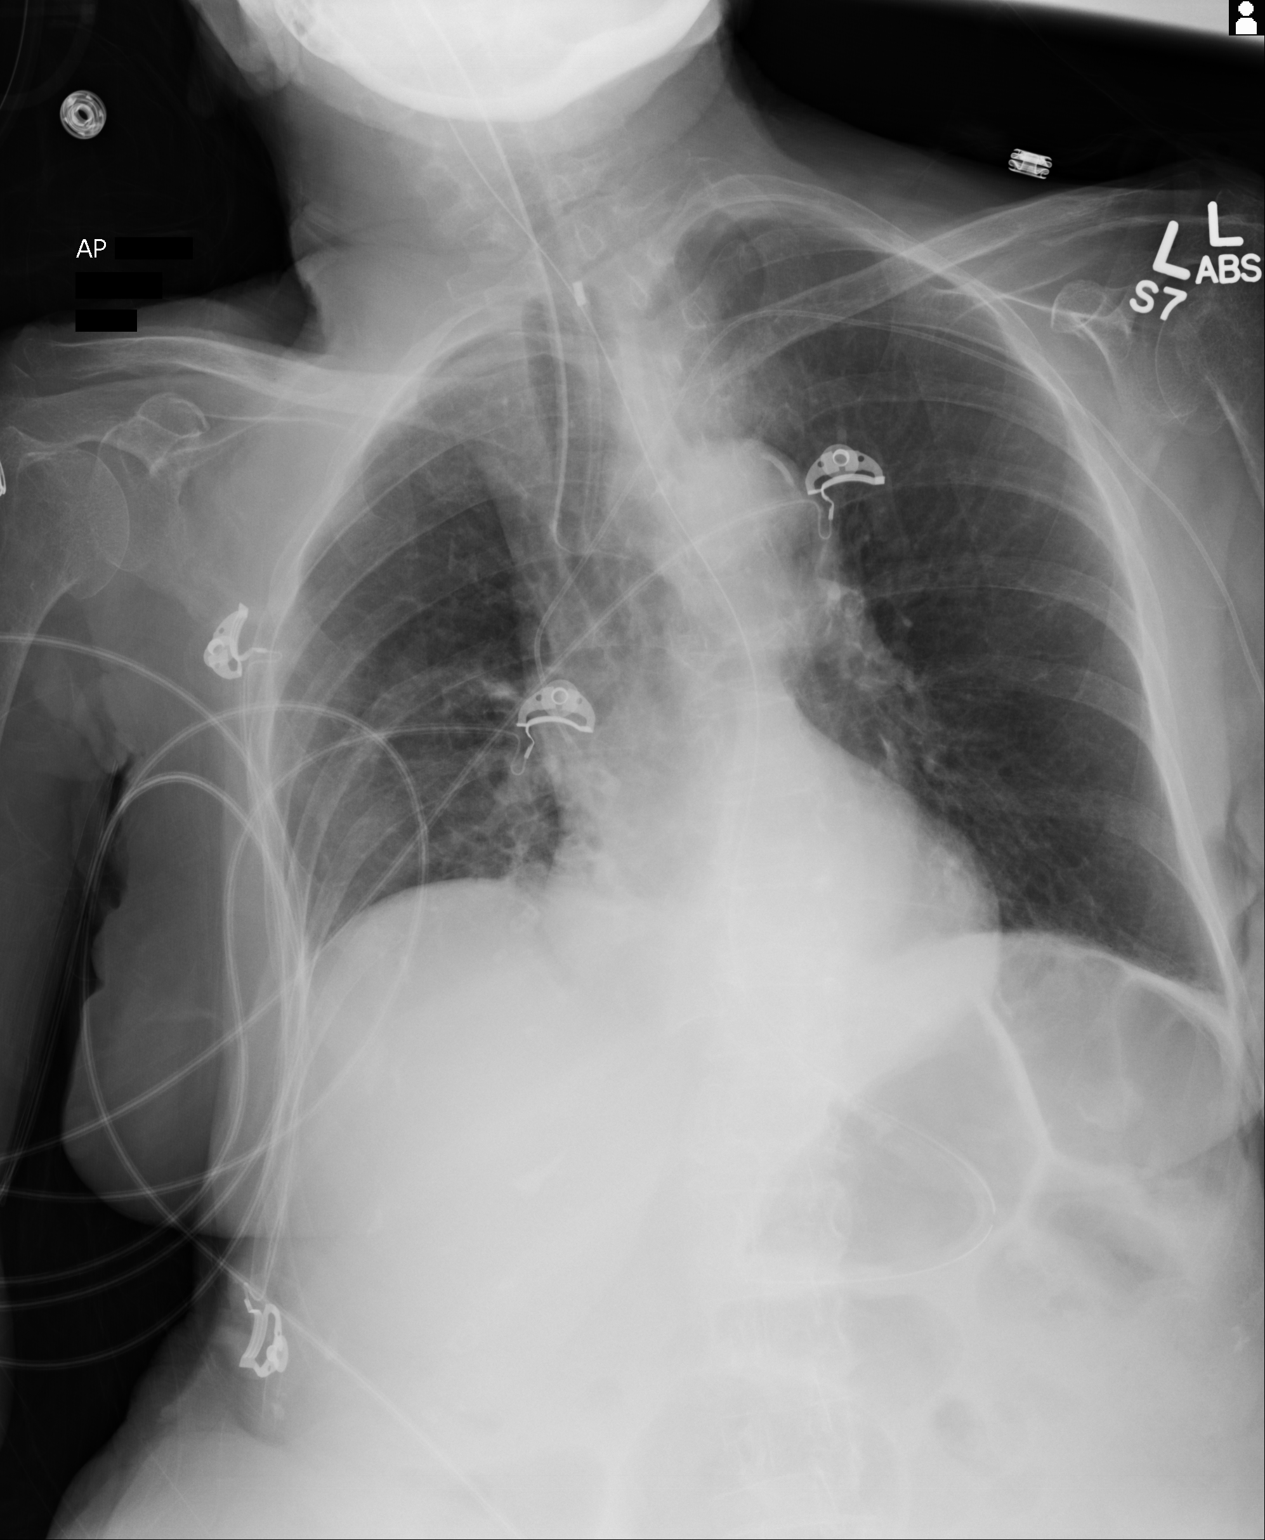

[1 of 1 positions shown; findings below may reference images not displayed]

FINDINGS: Endotracheal tube tip is 2.7 cm above the carina. Central catheter
tip is in the superior vena cava. Nasogastric tube tip and side port
are in the stomach. No pneumothorax. There is no edema or
consolidation. Heart size and pulmonary vascularity are normal. No
adenopathy. There is atherosclerotic calcification in the aorta. No
bone lesions are apparent.
IMPRESSION: Tube and catheter positions as described without pneumothorax. No
edema or consolidation.

## 2016-09-20 IMAGING — CR DG CHEST 1V
1 series · 1 of 1 positions shown · non-contrast
Comparison: 01/27/2015 P

CLINICAL DATA: Shortness of breath .

EXAM:
CHEST 1 VIEW

[ap]
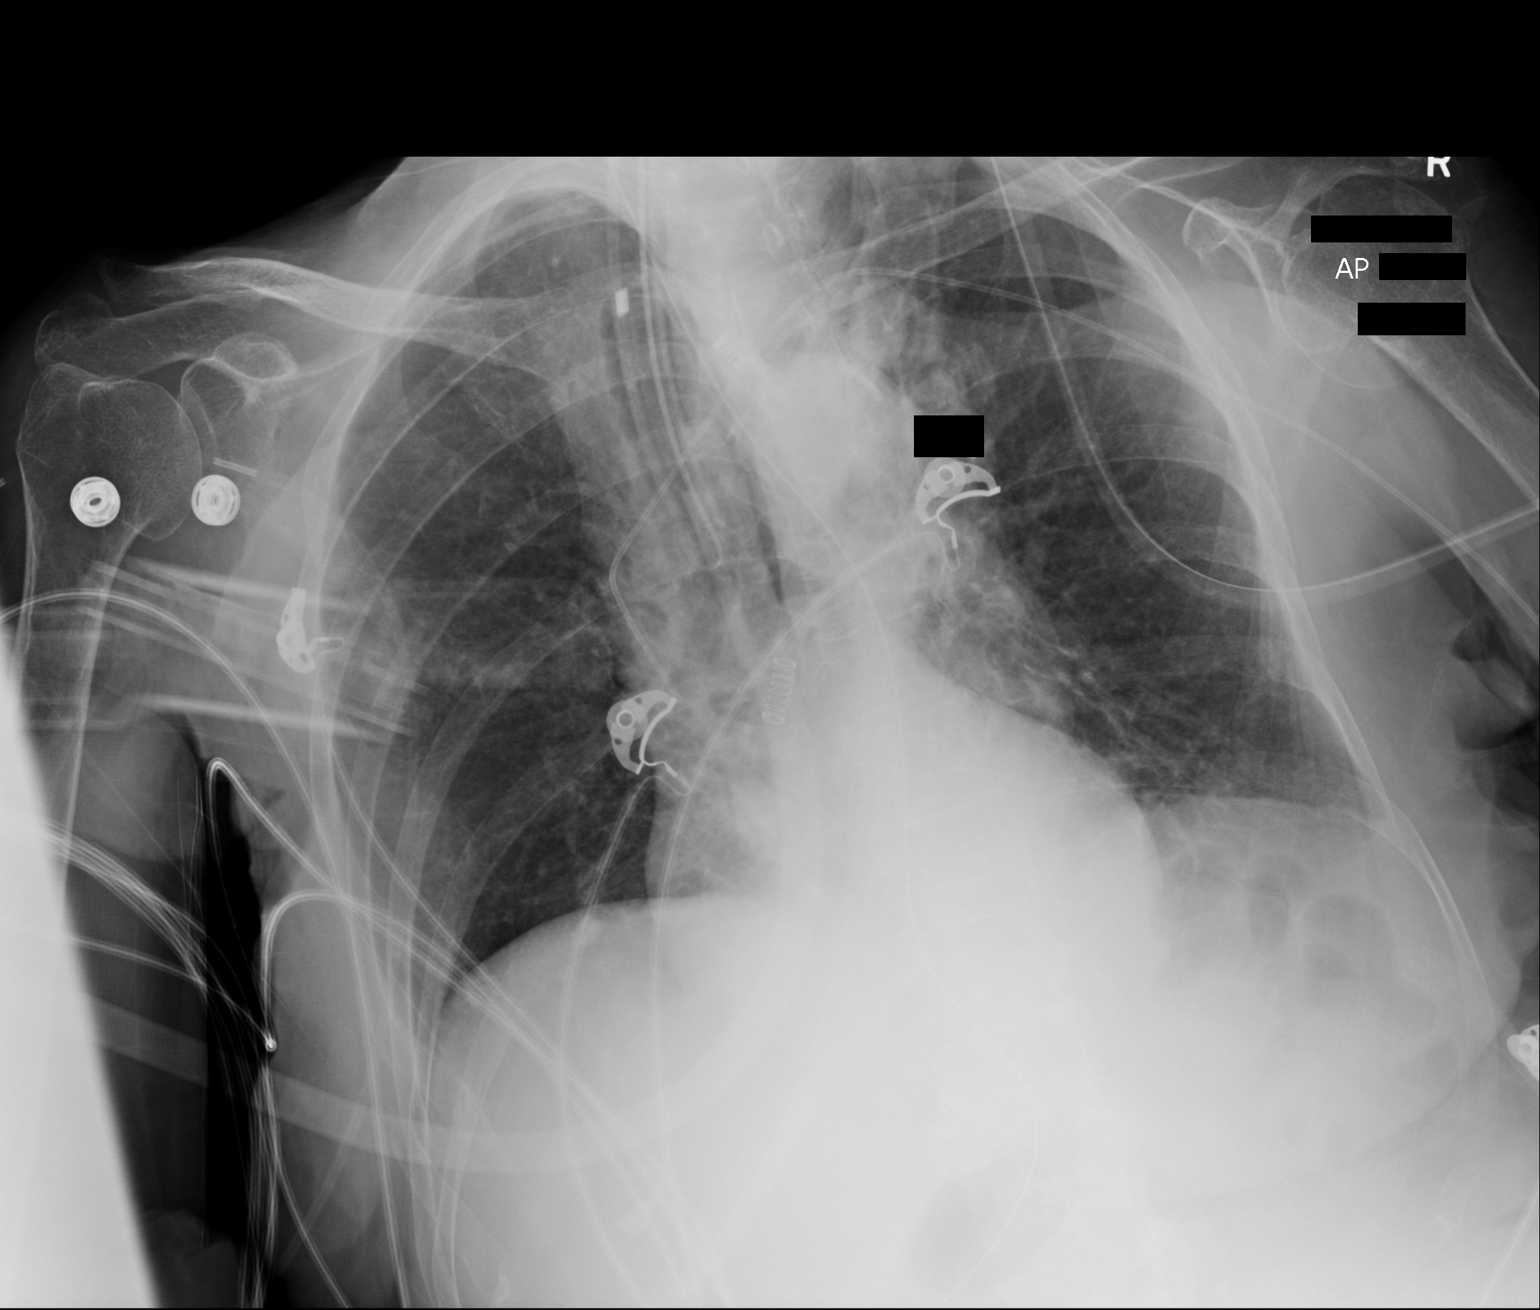

[1 of 1 positions shown; findings below may reference images not displayed]

FINDINGS: Endotracheal tube tip 1.3 cm above the carina. Proximal
repositioning of 2 cm suggested. NG tube and left PICC line stable
position. Mediastinum and hilar structures are unremarkable. Heart
size normal. Low lung volumes with bibasilar subsegmental
atelectasis. Left upper lobe mild subsegmental atelectasis and or
infiltrate cannot be excluded. Small left pleural effusion. No
pneumothorax.
IMPRESSION: 1. Endotracheal tube tip 1.3 cm above the carina. Proximal
repositioning of 2 cm suggested.
2. NG tube and left PICC line stable position.
3. Low lung volumes with mild bibasilar atelectasis. Mild left upper
lobe subsegmental atelectasis and or infiltrate cannot be excluded.

## 2016-09-21 IMAGING — CR DG CHEST 1V
1 series · 1 of 1 positions shown · non-contrast
Comparison: Chest x-ray dated 01/28/2015.

CLINICAL DATA: CCU patient, dyspnea.

EXAM:
CHEST 1 VIEW

[portable]
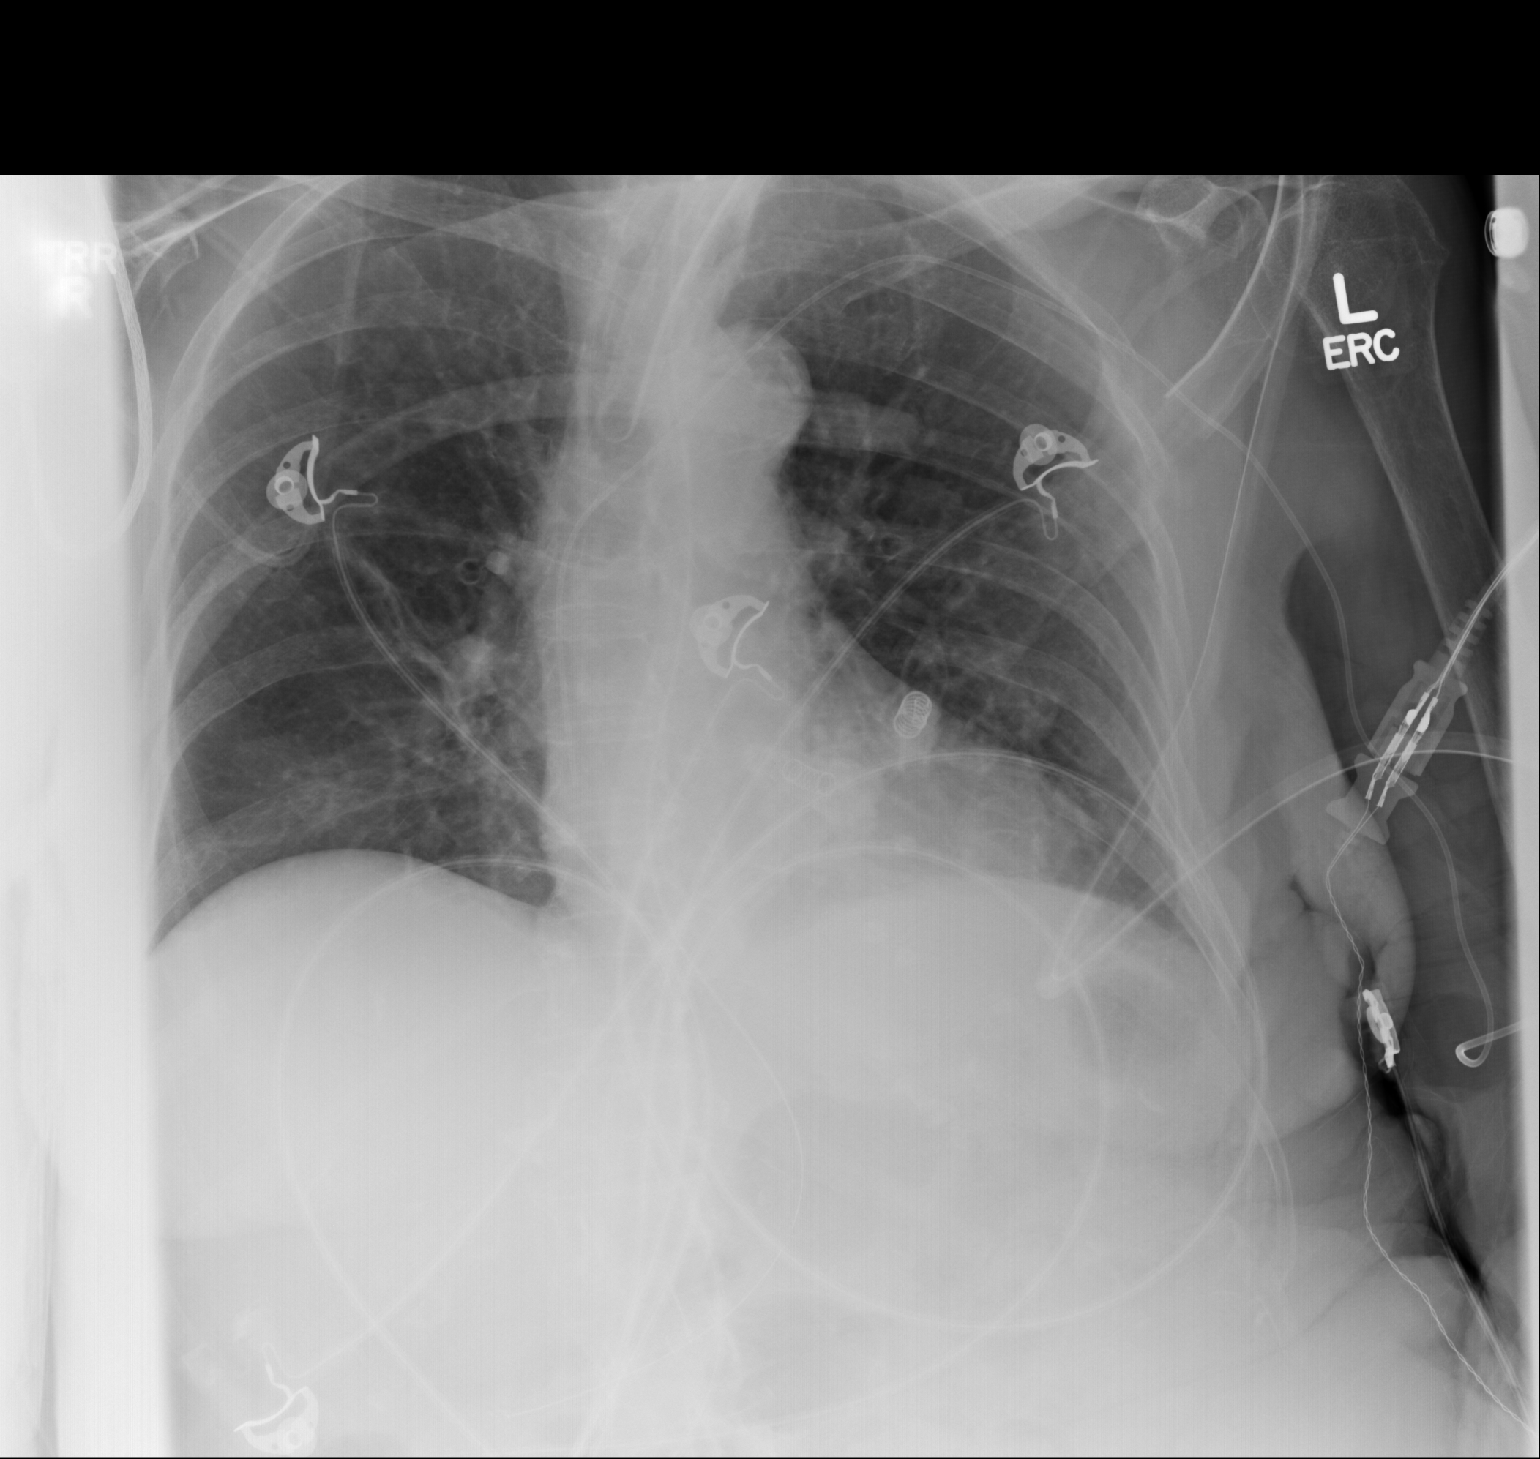

[1 of 1 positions shown; findings below may reference images not displayed]

FINDINGS: Heart size is normal. Overall cardiomediastinal silhouette is stable
in size and configuration. Endotracheal tube well positioned with
tip approximately 2.5 cm above the carina. Left-sided PICC line well
positioned with tip overlying the expected area of the cavoatrial
junction.

Lungs appear clear, perhaps mild atelectasis at the left lung base.
No pleural effusions seen. No pneumothorax seen.
IMPRESSION: No active disease.
